# Patient Record
Sex: Female | Born: 1950 | Race: White | Hispanic: No | Marital: Married | Smoking: Former smoker
Health system: Southern US, Community
[De-identification: ages and names within clinical notes are randomized; demographics above are authoritative.]

## PROBLEM LIST (undated history)

## (undated) DIAGNOSIS — Z800 Family history of malignant neoplasm of digestive organs: Secondary | ICD-10-CM

## (undated) DIAGNOSIS — Z973 Presence of spectacles and contact lenses: Secondary | ICD-10-CM

## (undated) DIAGNOSIS — Z803 Family history of malignant neoplasm of breast: Secondary | ICD-10-CM

## (undated) DIAGNOSIS — I371 Nonrheumatic pulmonary valve insufficiency: Secondary | ICD-10-CM

## (undated) DIAGNOSIS — M8589 Other specified disorders of bone density and structure, multiple sites: Secondary | ICD-10-CM

## (undated) DIAGNOSIS — C50919 Malignant neoplasm of unspecified site of unspecified female breast: Secondary | ICD-10-CM

## (undated) DIAGNOSIS — I071 Rheumatic tricuspid insufficiency: Secondary | ICD-10-CM

## (undated) DIAGNOSIS — I340 Nonrheumatic mitral (valve) insufficiency: Secondary | ICD-10-CM

## (undated) DIAGNOSIS — E7800 Pure hypercholesterolemia, unspecified: Secondary | ICD-10-CM

## (undated) DIAGNOSIS — T466X5A Adverse effect of antihyperlipidemic and antiarteriosclerotic drugs, initial encounter: Secondary | ICD-10-CM

## (undated) DIAGNOSIS — J189 Pneumonia, unspecified organism: Secondary | ICD-10-CM

## (undated) DIAGNOSIS — C801 Malignant (primary) neoplasm, unspecified: Secondary | ICD-10-CM

## (undated) DIAGNOSIS — M609 Myositis, unspecified: Secondary | ICD-10-CM

## (undated) DIAGNOSIS — K759 Inflammatory liver disease, unspecified: Secondary | ICD-10-CM

## (undated) DIAGNOSIS — K219 Gastro-esophageal reflux disease without esophagitis: Secondary | ICD-10-CM

## (undated) DIAGNOSIS — C73 Malignant neoplasm of thyroid gland: Secondary | ICD-10-CM

## (undated) DIAGNOSIS — R0600 Dyspnea, unspecified: Secondary | ICD-10-CM

## (undated) DIAGNOSIS — I10 Essential (primary) hypertension: Secondary | ICD-10-CM

## (undated) DIAGNOSIS — E039 Hypothyroidism, unspecified: Secondary | ICD-10-CM

## (undated) HISTORY — DX: Other specified disorders of bone density and structure, multiple sites: M85.89

## (undated) HISTORY — PX: MASTECTOMY: SHX3

## (undated) HISTORY — PX: NO PAST SURGERIES: SHX2092

## (undated) HISTORY — DX: Family history of malignant neoplasm of breast: Z80.3

## (undated) HISTORY — PX: COLONOSCOPY W/ POLYPECTOMY: SHX1380

## (undated) HISTORY — DX: Pure hypercholesterolemia, unspecified: E78.00

## (undated) HISTORY — PX: REDUCTION MAMMAPLASTY: SUR839

## (undated) HISTORY — DX: Adverse effect of antihyperlipidemic and antiarteriosclerotic drugs, initial encounter: T46.6X5A

## (undated) HISTORY — DX: Malignant neoplasm of thyroid gland: C73

## (undated) HISTORY — DX: Myositis, unspecified: M60.9

## (undated) HISTORY — DX: Family history of malignant neoplasm of digestive organs: Z80.0

## (undated) HISTORY — DX: Malignant neoplasm of unspecified site of unspecified female breast: C50.919

## (undated) HISTORY — PX: WISDOM TOOTH EXTRACTION: SHX21

---

## 2015-02-28 ENCOUNTER — Other Ambulatory Visit: Payer: Self-pay | Admitting: Otolaryngology

## 2015-02-28 DIAGNOSIS — J3800 Paralysis of vocal cords and larynx, unspecified: Secondary | ICD-10-CM

## 2015-02-28 DIAGNOSIS — Z8669 Personal history of other diseases of the nervous system and sense organs: Secondary | ICD-10-CM

## 2015-03-06 ENCOUNTER — Ambulatory Visit
Admission: RE | Admit: 2015-03-06 | Discharge: 2015-03-06 | Disposition: A | Discharge status: Home / Self Care | Payer: 59 | Source: Ambulatory Visit | Attending: Otolaryngology | Admitting: Otolaryngology

## 2015-03-06 DIAGNOSIS — E041 Nontoxic single thyroid nodule: Secondary | ICD-10-CM | POA: Insufficient documentation

## 2015-03-06 DIAGNOSIS — J3800 Paralysis of vocal cords and larynx, unspecified: Secondary | ICD-10-CM | POA: Insufficient documentation

## 2015-03-06 DIAGNOSIS — Z8669 Personal history of other diseases of the nervous system and sense organs: Secondary | ICD-10-CM | POA: Diagnosis present

## 2015-03-06 HISTORY — DX: Essential (primary) hypertension: I10

## 2015-03-06 MED ORDER — IOHEXOL 300 MG/ML  SOLN
75.0000 mL | Freq: Once | INTRAMUSCULAR | Status: AC | PRN
  Administered 2015-03-06: 75 mL via INTRAVENOUS

## 2015-03-07 ENCOUNTER — Other Ambulatory Visit: Payer: Self-pay | Admitting: Otolaryngology

## 2015-03-07 DIAGNOSIS — E041 Nontoxic single thyroid nodule: Secondary | ICD-10-CM

## 2015-03-14 ENCOUNTER — Ambulatory Visit
Admission: RE | Admit: 2015-03-14 | Discharge: 2015-03-14 | Disposition: A | Discharge status: Home / Self Care | Payer: 59 | Source: Ambulatory Visit | Attending: Otolaryngology | Admitting: Otolaryngology

## 2015-03-14 ENCOUNTER — Other Ambulatory Visit: Payer: Self-pay | Admitting: Otolaryngology

## 2015-03-14 DIAGNOSIS — E041 Nontoxic single thyroid nodule: Secondary | ICD-10-CM | POA: Diagnosis present

## 2015-03-14 DIAGNOSIS — C73 Malignant neoplasm of thyroid gland: Secondary | ICD-10-CM | POA: Diagnosis not present

## 2015-03-14 NOTE — Procedures (Signed)
US guided FNA of right thyroid nodules.  Nodule in superior lateral aspect and smaller nodule in medial right lobe. No immediate complication.

## 2015-03-18 LAB — CYTOLOGY - NON PAP

## 2015-03-21 ENCOUNTER — Other Ambulatory Visit: Payer: 59

## 2015-03-21 ENCOUNTER — Encounter: Payer: Self-pay | Admitting: *Deleted

## 2015-03-21 NOTE — Patient Instructions (Signed)
  Your procedure is scheduled on: 03-26-15 [facility]) Report to [place] To find out your arrival time please call [phone] between 1PM - 3PM on 03-25-15 (TUESDAY)  Remember: Instructions that are not followed completely may result in serious medical risk, up to and including death, or upon the discretion of your surgeon and anesthesiologist your surgery may need to be rescheduled.    _X___ 1. Do not eat food or drink liquids after midnight. No gum chewing or hard candies.     _X___ 2. No Alcohol for 24 hours before or after surgery.   ____ 3. Bring all medications with you on the day of surgery if instructed.    __X__ 4. Notify your doctor if there is any change in your medical condition     (cold, fever, infections).     Do not wear jewelry, make-up, hairpins, clips or nail polish.  Do not wear lotions, powders, or perfumes. You may wear deodorant.  Do not shave 48 hours prior to surgery. Men may shave face and neck.  Do not bring valuables to the hospital.    [facility] is not responsible for any belongings or valuables.               Contacts, dentures or bridgework may not be worn into surgery.  Leave your suitcase in the car. After surgery it may be brought to your room.  For patients admitted to the hospital, discharge time is determined by your treatment team.   Patients discharged the day of surgery will not be allowed to drive home.   Please read over the following fact sheets that you were given:     _X___ Take these medicines the morning of surgery with A SIP OF WATER:    1. PROZAC  2. LISINOPRIL  3. LEVOTHYROXINE  4. ZANTAC  5. TAKE A ZANTAC ON Tuesday NIGHT  6.  ____ Fleet Enema (as directed)   _X___ Use CHG Soap as directed  ____ Use inhalers on the day of surgery  ____ Stop metformin 2 days prior to surgery    ____ Take 1/2 of usual insulin dose the night before surgery and none on the morning of surgery.   ____  Stop Coumadin/Plavix/aspirin-N/A  ____ Stop Anti-inflammatories-NO NSAIDS OR ASA PRODUCTS-TYLENOL OK TO TAKE   ____ Stop supplements until after surgery.    ____ Bring C-Pap to the hospital.

## 2015-03-24 ENCOUNTER — Inpatient Hospital Stay: Admission: RE | Admit: 2015-03-24 | Payer: 59 | Source: Ambulatory Visit

## 2015-03-25 ENCOUNTER — Inpatient Hospital Stay: Admission: RE | Admit: 2015-03-25 | Payer: 59 | Source: Ambulatory Visit

## 2015-03-25 ENCOUNTER — Encounter: Payer: Self-pay | Admitting: *Deleted

## 2015-03-26 ENCOUNTER — Observation Stay
Admission: RE | Admit: 2015-03-26 | Discharge: 2015-03-27 | Disposition: A | Discharge status: Home / Self Care | Payer: 59 | Source: Ambulatory Visit | Attending: Otolaryngology | Admitting: Otolaryngology

## 2015-03-26 ENCOUNTER — Ambulatory Visit: Payer: 59 | Admitting: Anesthesiology

## 2015-03-26 ENCOUNTER — Encounter: Payer: Self-pay | Admitting: *Deleted

## 2015-03-26 ENCOUNTER — Encounter
Admission: RE | Disposition: A | Discharge status: Home / Self Care | Payer: Self-pay | Source: Ambulatory Visit | Attending: Otolaryngology

## 2015-03-26 DIAGNOSIS — K219 Gastro-esophageal reflux disease without esophagitis: Secondary | ICD-10-CM | POA: Insufficient documentation

## 2015-03-26 DIAGNOSIS — Z8249 Family history of ischemic heart disease and other diseases of the circulatory system: Secondary | ICD-10-CM | POA: Diagnosis not present

## 2015-03-26 DIAGNOSIS — E785 Hyperlipidemia, unspecified: Secondary | ICD-10-CM | POA: Diagnosis not present

## 2015-03-26 DIAGNOSIS — Z7951 Long term (current) use of inhaled steroids: Secondary | ICD-10-CM | POA: Insufficient documentation

## 2015-03-26 DIAGNOSIS — Z79899 Other long term (current) drug therapy: Secondary | ICD-10-CM | POA: Diagnosis not present

## 2015-03-26 DIAGNOSIS — Z8349 Family history of other endocrine, nutritional and metabolic diseases: Secondary | ICD-10-CM | POA: Diagnosis not present

## 2015-03-26 DIAGNOSIS — Z83511 Family history of glaucoma: Secondary | ICD-10-CM | POA: Insufficient documentation

## 2015-03-26 DIAGNOSIS — K759 Inflammatory liver disease, unspecified: Secondary | ICD-10-CM | POA: Insufficient documentation

## 2015-03-26 DIAGNOSIS — C73 Malignant neoplasm of thyroid gland: Secondary | ICD-10-CM | POA: Diagnosis not present

## 2015-03-26 DIAGNOSIS — Z8262 Family history of osteoporosis: Secondary | ICD-10-CM | POA: Insufficient documentation

## 2015-03-26 DIAGNOSIS — I10 Essential (primary) hypertension: Secondary | ICD-10-CM | POA: Insufficient documentation

## 2015-03-26 DIAGNOSIS — Z87891 Personal history of nicotine dependence: Secondary | ICD-10-CM | POA: Insufficient documentation

## 2015-03-26 DIAGNOSIS — M1991 Primary osteoarthritis, unspecified site: Secondary | ICD-10-CM | POA: Diagnosis not present

## 2015-03-26 HISTORY — DX: Gastro-esophageal reflux disease without esophagitis: K21.9

## 2015-03-26 HISTORY — DX: Inflammatory liver disease, unspecified: K75.9

## 2015-03-26 HISTORY — DX: Malignant (primary) neoplasm, unspecified: C80.1

## 2015-03-26 HISTORY — PX: THYROIDECTOMY: SHX17

## 2015-03-26 LAB — CALCIUM: Calcium: 9.1 mg/dL (ref 8.9–10.3)

## 2015-03-26 SURGERY — THYROIDECTOMY
Anesthesia: General | Laterality: Bilateral

## 2015-03-26 MED ORDER — ZOLPIDEM TARTRATE 5 MG PO TABS
10.0000 mg | ORAL_TABLET | Freq: Once | ORAL | Status: AC
  Administered 2015-03-27: 10 mg via ORAL
  Filled 2015-03-26: qty 2

## 2015-03-26 MED ORDER — BACITRACIN ZINC 500 UNIT/GM EX OINT
1.0000 "application " | TOPICAL_OINTMENT | Freq: Three times a day (TID) | CUTANEOUS | Status: DC
  Administered 2015-03-26 – 2015-03-27 (×3): 1 via TOPICAL
  Filled 2015-03-26 (×2): qty 0.9

## 2015-03-26 MED ORDER — REMIFENTANIL HCL 1 MG IV SOLR
INTRAVENOUS | Status: DC | PRN
  Administered 2015-03-26: .025 ug/kg/min via INTRAVENOUS
  Administered 2015-03-26: .05 ug/kg/min via INTRAVENOUS

## 2015-03-26 MED ORDER — PHENYLEPHRINE HCL 10 MG/ML IJ SOLN
INTRAMUSCULAR | Status: DC | PRN
  Administered 2015-03-26 (×7): 100 ug via INTRAVENOUS

## 2015-03-26 MED ORDER — DEXAMETHASONE SODIUM PHOSPHATE 10 MG/ML IJ SOLN
INTRAMUSCULAR | Status: DC | PRN
  Administered 2015-03-26: 8 mg via INTRAVENOUS

## 2015-03-26 MED ORDER — EPHEDRINE SULFATE 50 MG/ML IJ SOLN
INTRAMUSCULAR | Status: DC | PRN
  Administered 2015-03-26 (×2): 10 mg via INTRAVENOUS

## 2015-03-26 MED ORDER — FENTANYL CITRATE (PF) 100 MCG/2ML IJ SOLN
25.0000 ug | INTRAMUSCULAR | Status: DC | PRN
  Administered 2015-03-26 (×3): 50 ug via INTRAVENOUS

## 2015-03-26 MED ORDER — GLYCOPYRROLATE 0.2 MG/ML IJ SOLN
INTRAMUSCULAR | Status: DC | PRN
  Administered 2015-03-26: 0.2 mg via INTRAVENOUS

## 2015-03-26 MED ORDER — ONDANSETRON HCL 4 MG/2ML IJ SOLN
4.0000 mg | INTRAMUSCULAR | Status: DC | PRN
  Administered 2015-03-26: 4 mg via INTRAVENOUS
  Filled 2015-03-26: qty 2

## 2015-03-26 MED ORDER — POTASSIUM CL IN DEXTROSE 5% 20 MEQ/L IV SOLN
20.0000 meq | INTRAVENOUS | Status: DC
  Administered 2015-03-26 (×2): 20 meq via INTRAVENOUS
  Filled 2015-03-26 (×3): qty 1000

## 2015-03-26 MED ORDER — PROPOFOL 10 MG/ML IV BOLUS
INTRAVENOUS | Status: DC | PRN
  Administered 2015-03-26: 150 mg via INTRAVENOUS
  Administered 2015-03-26: 50 mg via INTRAVENOUS

## 2015-03-26 MED ORDER — OXYCODONE HCL 5 MG/5ML PO SOLN: 5.0000 mg | Freq: Once | ORAL | Status: DC | PRN

## 2015-03-26 MED ORDER — MIDAZOLAM HCL 2 MG/2ML IJ SOLN
INTRAMUSCULAR | Status: DC | PRN
  Administered 2015-03-26: 2 mg via INTRAVENOUS

## 2015-03-26 MED ORDER — OXYCODONE HCL 5 MG PO TABS: 5.0000 mg | ORAL_TABLET | Freq: Once | ORAL | Status: DC | PRN

## 2015-03-26 MED ORDER — HYDROCODONE-ACETAMINOPHEN 5-325 MG PO TABS
1.0000 | ORAL_TABLET | ORAL | Status: DC | PRN
  Administered 2015-03-26: 2 via ORAL
  Administered 2015-03-26: 1 via ORAL
  Filled 2015-03-26: qty 1
  Filled 2015-03-26: qty 2

## 2015-03-26 MED ORDER — ONDANSETRON HCL 4 MG/2ML IJ SOLN
INTRAMUSCULAR | Status: DC | PRN
  Administered 2015-03-26: 4 mg via INTRAVENOUS

## 2015-03-26 MED ORDER — FENTANYL CITRATE (PF) 100 MCG/2ML IJ SOLN
INTRAMUSCULAR | Status: AC
  Filled 2015-03-26: qty 2

## 2015-03-26 MED ORDER — FAMOTIDINE 20 MG PO TABS
20.0000 mg | ORAL_TABLET | Freq: Every day | ORAL | Status: DC
  Administered 2015-03-26: 20 mg via ORAL
  Filled 2015-03-26: qty 1

## 2015-03-26 MED ORDER — FLUOXETINE HCL 20 MG PO CAPS: 40.0000 mg | ORAL_CAPSULE | Freq: Every day | ORAL | Status: DC

## 2015-03-26 MED ORDER — LIDOCAINE-EPINEPHRINE (PF) 1 %-1:200000 IJ SOLN
INTRAMUSCULAR | Status: AC
  Filled 2015-03-26: qty 30

## 2015-03-26 MED ORDER — ONDANSETRON HCL 4 MG PO TABS: 4.0000 mg | ORAL_TABLET | ORAL | Status: DC | PRN

## 2015-03-26 MED ORDER — HYDROMORPHONE HCL 1 MG/ML IJ SOLN
INTRAMUSCULAR | Status: AC
  Administered 2015-03-26: 0.5 mg via INTRAVENOUS
  Filled 2015-03-26: qty 1

## 2015-03-26 MED ORDER — FENTANYL CITRATE (PF) 100 MCG/2ML IJ SOLN
INTRAMUSCULAR | Status: AC
  Administered 2015-03-26: 50 ug via INTRAVENOUS
  Filled 2015-03-26: qty 2

## 2015-03-26 MED ORDER — BACITRACIN 500 UNIT/GM EX OINT
TOPICAL_OINTMENT | CUTANEOUS | Status: DC | PRN
  Administered 2015-03-26: 1 via TOPICAL

## 2015-03-26 MED ORDER — LACTATED RINGERS IV SOLN
INTRAVENOUS | Status: DC
  Administered 2015-03-26 (×2): via INTRAVENOUS

## 2015-03-26 MED ORDER — HYDROMORPHONE HCL 1 MG/ML IJ SOLN
0.5000 mg | INTRAMUSCULAR | Status: DC | PRN
  Administered 2015-03-26 (×2): 0.5 mg via INTRAVENOUS

## 2015-03-26 MED ORDER — SUCCINYLCHOLINE CHLORIDE 20 MG/ML IJ SOLN
INTRAMUSCULAR | Status: DC | PRN
  Administered 2015-03-26: 100 mg via INTRAVENOUS

## 2015-03-26 MED ORDER — LIDOCAINE-EPINEPHRINE (PF) 1 %-1:200000 IJ SOLN
INTRAMUSCULAR | Status: DC | PRN
  Administered 2015-03-26: 3 mL

## 2015-03-26 MED ORDER — CALCIUM CARBONATE-VITAMIN D 500-200 MG-UNIT PO TABS
2.0000 | ORAL_TABLET | Freq: Two times a day (BID) | ORAL | Status: DC
  Administered 2015-03-26: 2 via ORAL
  Filled 2015-03-26: qty 2

## 2015-03-26 MED ORDER — FENTANYL CITRATE (PF) 100 MCG/2ML IJ SOLN
INTRAMUSCULAR | Status: DC | PRN
  Administered 2015-03-26 (×2): 50 ug via INTRAVENOUS

## 2015-03-26 MED ORDER — DOCUSATE SODIUM 100 MG PO CAPS
100.0000 mg | ORAL_CAPSULE | Freq: Two times a day (BID) | ORAL | Status: DC
  Administered 2015-03-26: 100 mg via ORAL
  Filled 2015-03-26: qty 1

## 2015-03-26 MED ORDER — BACITRACIN ZINC 500 UNIT/GM EX OINT
TOPICAL_OINTMENT | CUTANEOUS | Status: AC
  Filled 2015-03-26: qty 28.35

## 2015-03-26 MED ORDER — LISINOPRIL 10 MG PO TABS: 10.0000 mg | ORAL_TABLET | Freq: Every morning | ORAL | Status: DC

## 2015-03-26 MED ORDER — LIDOCAINE HCL (CARDIAC) 20 MG/ML IV SOLN
INTRAVENOUS | Status: DC | PRN
  Administered 2015-03-26: 80 mg via INTRAVENOUS

## 2015-03-26 MED ORDER — MORPHINE SULFATE (PF) 2 MG/ML IV SOLN
2.0000 mg | INTRAVENOUS | Status: DC | PRN
  Administered 2015-03-26 (×2): 2 mg via INTRAVENOUS
  Filled 2015-03-26 (×2): qty 1

## 2015-03-26 SURGICAL SUPPLY — 33 items
BLADE SURG 15 STRL LF DISP TIS (BLADE) ×1 IMPLANT
BLADE SURG 15 STRL SS (BLADE) ×2
CANISTER SUCT 1200ML W/VALVE (MISCELLANEOUS) ×3 IMPLANT
CORD BIP STRL DISP 12FT (MISCELLANEOUS) ×3 IMPLANT
DRAIN TLS ROUND 10FR (DRAIN) ×6 IMPLANT
DRAPE MAG INST 16X20 L/F (DRAPES) ×3 IMPLANT
DRSG TEGADERM 2-3/8X2-3/4 SM (GAUZE/BANDAGES/DRESSINGS) ×3 IMPLANT
ELECT LARYNGEAL 6/7 (MISCELLANEOUS) ×3
ELECT LARYNGEAL 8/9 (MISCELLANEOUS) ×3
ELECTRODE LARYNGEAL 6/7 (MISCELLANEOUS) ×1 IMPLANT
ELECTRODE LARYNGEAL 8/9 (MISCELLANEOUS) ×1 IMPLANT
FORCEPS JEWEL BIP 4-3/4 STR (INSTRUMENTS) ×3 IMPLANT
GLOVE BIO SURGEON STRL SZ7.5 (GLOVE) ×6 IMPLANT
GOWN STRL REUS W/ TWL LRG LVL3 (GOWN DISPOSABLE) ×2 IMPLANT
GOWN STRL REUS W/TWL LRG LVL3 (GOWN DISPOSABLE) ×4
HARMONIC SCALPEL FOCUS (MISCELLANEOUS) ×3 IMPLANT
HEMOSTAT SURGICEL 2X3 (HEMOSTASIS) ×3 IMPLANT
HOOK STAY BLUNT/RETRACTOR 5M (MISCELLANEOUS) ×3 IMPLANT
KIT RM TURNOVER STRD PROC AR (KITS) ×3 IMPLANT
LABEL OR SOLS (LABEL) IMPLANT
NS IRRIG 500ML POUR BTL (IV SOLUTION) IMPLANT
PACK HEAD/NECK (MISCELLANEOUS) ×3 IMPLANT
PAD GROUND ADULT SPLIT (MISCELLANEOUS) ×3 IMPLANT
PROBE NEUROSIGN BIPOL (MISCELLANEOUS) ×1 IMPLANT
PROBE NEUROSIGN BIPOLAR (MISCELLANEOUS) ×2
SPONGE KITTNER 5P (MISCELLANEOUS) ×6 IMPLANT
SPONGE XRAY 4X4 16PLY STRL (MISCELLANEOUS) ×3 IMPLANT
SUT PROLENE 3 0 PS 2 (SUTURE) ×3 IMPLANT
SUT SILK 2 0 (SUTURE)
SUT SILK 2 0 SH (SUTURE) ×3 IMPLANT
SUT SILK 2-0 18XBRD TIE 12 (SUTURE) IMPLANT
SUT VIC AB 4-0 RB1 18 (SUTURE) ×3 IMPLANT
SYSTEM CHEST DRAIN TLS 7FR (DRAIN) IMPLANT

## 2015-03-26 NOTE — Anesthesia Procedure Notes (Signed)
Procedure Name: Intubation Date/Time: 03/26/2015 7:15 AM Performed by: Andria Frames Pre-anesthesia Checklist: Patient identified, Emergency Drugs available, Suction available, Patient being monitored and Timeout performed Patient Re-evaluated:Patient Re-evaluated prior to inductionOxygen Delivery Method: Circle system utilized Preoxygenation: Pre-oxygenation with 100% oxygen Intubation Type: IV induction Ventilation: Mask ventilation without difficulty Laryngoscope Size: McGraph and 3 Grade View: Grade II Tube type: Oral Tube size: 7.0 mm Number of attempts: 1 Airway Equipment and Method: Stylet Placement Confirmation: ETT inserted through vocal cords under direct vision,  positive ETCO2 and breath sounds checked- equal and bilateral Secured at: 21 cm Tube secured with: Tape Dental Injury: Teeth and Oropharynx as per pre-operative assessment

## 2015-03-26 NOTE — H&P (Signed)
History and physical reviewed and will be scanned in later. No change in medical status reported by the patient or family, appears stable for surgery. All questions regarding the procedure answered, and patient (or family if a child) expressed understanding of the procedure.  Gabrielle Davidson S @TODAY@ 

## 2015-03-26 NOTE — Progress Notes (Signed)
Pt was complaining of pain but stated the PO and IV pain medications were making her feel sick. Asked if she wanted pain medication with zofran - pt stated that she tried that earlier and it didn't really help much. Pt requested we call the [facility] MD and ask for something for sleep - she wanted to sleep off the pain. Pt stated that Lorrin Mais has worked for her in the past. Pt's IV was also infiltrated. MD notified of ambien request and IV - ordered 10mg  ambien and gave ok to leave out IV.

## 2015-03-26 NOTE — Progress Notes (Signed)
TLS drains x 2 intact.

## 2015-03-26 NOTE — Anesthesia Preprocedure Evaluation (Addendum)
Anesthesia Evaluation  Patient identified by MRN, date of birth, ID band Patient awake    Reviewed: Allergy & Precautions, H&P , NPO status , Patient's Chart, lab work & pertinent test results  History of Anesthesia Complications Negative for: history of anesthetic complications  Airway Mallampati: II  TM Distance: >3 FB Neck ROM: full    Dental no notable dental hx. (+) Teeth Intact   Pulmonary neg shortness of breath, former smoker,    Pulmonary exam normal breath sounds clear to auscultation       Cardiovascular Exercise Tolerance: Good hypertension, (-) angina(-) Past MI, (-) Orthopnea and (-) DOE Normal cardiovascular exam Rhythm:regular Rate:Normal     Neuro/Psych negative neurological ROS  negative psych ROS   GI/Hepatic GERD  Controlled,(+) Hepatitis -  Endo/Other  negative endocrine ROS  Renal/GU negative Renal ROS  negative genitourinary   Musculoskeletal   Abdominal   Peds  Hematology negative hematology ROS (+)   Anesthesia Other Findings Past Medical History:   Hypertension                                                 GERD (gastroesophageal reflux disease)                       Cancer (HCC)                                                 Hepatitis                                                      Comment:A-YEARS AGO  Past Surgical History:   NO PAST SURGERIES                                            BMI    Body Mass Index   29.27 kg/m 2    Patient has hoarse voice which she believes is from her thyroid   Reproductive/Obstetrics negative OB ROS                            Anesthesia Physical Anesthesia Plan  ASA: III  Anesthesia Plan: General ETT   Post-op Pain Management:    Induction:   Airway Management Planned:   Additional Equipment:   Intra-op Plan:   Post-operative Plan:   Informed Consent: I have reviewed the patients History and  Physical, chart, labs and discussed the procedure including the risks, benefits and alternatives for the proposed anesthesia with the patient or authorized representative who has indicated his/her understanding and acceptance.   Dental Advisory Given  Plan Discussed with: Anesthesiologist, CRNA and Surgeon  Anesthesia Plan Comments:         Anesthesia Quick Evaluation

## 2015-03-26 NOTE — Progress Notes (Signed)
Ice pack to neck. 

## 2015-03-26 NOTE — Op Note (Signed)
03/26/2015  9:29 AM    Gabrielle Davidson  [id number]   Pre-Op Diagnosis:  Thyroid cancer  Post-op Diagnosis: Thyroid cancer  Procedure:   Total thyroidectomy  Surgeon:  [name]   First Assistant: Beverly Gust  Anesthesia:  General endotracheal  EBL:  Less than 25 cc  Complications:  None  Findings: Nodular thyroid gland with adhesions of the left lobe of the gland to the underlying cricoid cartilage and trachea, and cricothyroid muscle. 1 cm nodule in the right superior thyroid lobe with some adhesions medially near the cricoid cartilage. Both recurrent laryngeal nerves were identified and preserved. The gland was not significantly adherent to the underlying recurrent laryngeal nerves.   Procedure: After the patient was identified in holding and the procedure was reviewed.  The patient was taken to the operating room and with the patient in a comfortable supine position,  general endotracheal anesthesia was induced without difficulty.  A nerve monitor on the endotracheal tube was visualized to be between the cords at the time of intubation. A proper time-out was performed, confirming the operative site and procedure.  The patient was placed on a shoulder roll and position. Skin was injected with 1% lidocaine with epinephrine 1-200,000. The patient was then prepped and draped in the usual sterile fashion. A 15 blade was used to incise the skin carrying the incision down through the subcutaneous tissues. The platysma muscle was divided with the Bovie. The strap muscles were identified in the midline and divided in the midline, retracting them laterally. Small anterior jugular veins were either clamped and suture divided or divided with the Harmonic scalpel. The strap muscles were retracted laterally off of the capsule of the left lobe of the thyroid gland. This was then finger dissected to loosen loose attachments to the gland. There was significant adhesion of the strap  muscle to the capsule of the gland, and it was carefully dissected off using the Harmonic scalpel. Dissection proceeded superiorly, dissecting the superior pole of the gland. The superior pole vessels were divided with the Harmonic scalpel. A small parathyroid gland was visualized and preserved during this dissection. The superior pole was retracted inferiorly and dissection proceeded around the lateral aspect of the gland, dividing vascular attachments with the Harmonic scalpel. The inferior pole was dissected, identifying a small structure consistent with the parathyroid, and the inferior pole vessels were divided with the Harmonic scalpel. Care was taken to divide all of these vessels right at the capsule of the gland. The gland was then retracted medially and delivered from the wound. Dissection along the trachea revealed the recurrent laryngeal nerve near its entrance to the larynx, which stimulated properly. The gland was then dissected off of the trachea using the bipolar cautery, dividing Berry's ligament with the nerve carefully protected. The gland was particularly adherent in this region to the cricoid cartilage and adjacent trachea, possibly with some focal invasion. The left lobe of the gland was then reflected medially and attention turned to the opposite side. Next the right lobe of the thyroid gland was dissected in the same fashion as described above. Once again the superior and inferior pole vessels were divided right at the capsule of the gland and structures consistent with parathyroid tissue were identified both superiorly and inferiorly. Retracting the gland medially the recurrent laryngeal nerve was identified and confirmed with the stimulator. This was then carefully protected as the gland was dissected away from the trachea and Berry's ligament divided. Once again adhesions were noted  medially to the cricoid cartilage, requiring careful dissection away from the cricoid cartilage with the  bipolar cautery. The right lobe was delivered and the entire gland sent as a specimen with the long stitch on the right lobe, and a short stitch on the left lobe. A portion of the left lobe had torn free and was sent as a free specimen.  The wound was then irrigated and inspected for bleeding. #10 TLS drains were placed on either side of the trachea with the drains coming out through the skin just below the wound. The drains were secured with 4-0 Vicryl suture. Surgicel was placed on either side of the trachea to control minor oozing within the wound. The strap muscles were then reapproximated with 4-0 Vicryl suture. The platysma and subcutaneous tissues were also closed with 4-0 Vicryl suture. The skin was closed with a 3-0 running subcuticular Prolene suture. The patient was then returned to the anesthesiologist for awakening. The patient was awakened and taken to recovery room in good condition postoperatively.  The patient was then returned to the anesthesiologist in good condition for awakening. The patient was awakened and taken to the recovery room in good condition.   Disposition:   PACU then transferred to floor  Plan: The patient is to be admitted for observation and monitoring of serum calcium, with potential discharge tomorrow if serum calcium is stable overnight.  [name] 03/26/2015 9:29 AM

## 2015-03-26 NOTE — Progress Notes (Signed)
Patient ID: Gabrielle Davidson, female   DOB: 08/24/1950, 65 y.o.   MRN: [id number] Gabrielle Davidson, Gabrielle Davidson [id number] 08/27/1950 Gabrielle Nearing, MD   SUBJECTIVE: This 65 y.o. year old female is status post THYROIDECTOMY. Doing well post-op, eating solids. Pain well controlled. Hoarse, but no worse than preop (she has known left cord paresis).  Medications:  Current Facility-Administered Medications  Medication Dose Route Frequency Provider Last Rate Last Dose  . bacitracin ointment 1 application  1 application Topical 3 times per day Gabrielle Canterbury, MD   1 application at [id number]  . calcium-vitamin D (OSCAL WITH D) 500-200 MG-UNIT per tablet 2 tablet  2 tablet Oral BID Gabrielle Canterbury, MD   Stopped at 03/26/15 1100  . dextrose 5 % with KCl 20 mEq / L  infusion  20 mEq Intravenous Continuous Gabrielle Canterbury, MD 100 mL/hr at 03/26/15 1346 20 mEq at 03/26/15 1346  . docusate sodium (COLACE) capsule 100 mg  100 mg Oral BID Gabrielle Canterbury, MD   Stopped at 03/26/15 1100  . famotidine (PEPCID) tablet 20 mg  20 mg Oral QHS Gabrielle Canterbury, MD      . fentaNYL (SUBLIMAZE) 100 MCG/2ML injection           . [START ON 03/27/2015] FLUoxetine (PROZAC) capsule 40 mg  40 mg Oral Daily Gabrielle Canterbury, MD      . HYDROcodone-acetaminophen (NORCO/VICODIN) 5-325 MG per tablet 1-2 tablet  1-2 tablet Oral Q4H PRN Gabrielle Canterbury, MD   1 tablet at 03/26/15 1345  . lisinopril (PRINIVIL,ZESTRIL) tablet 10 mg  10 mg Oral q morning - 10a Gabrielle Canterbury, MD      . morphine 2 MG/ML injection 2-4 mg  2-4 mg Intravenous Q2H PRN Gabrielle Canterbury, MD   2 mg at 03/26/15 1600  . ondansetron (ZOFRAN) tablet 4 mg  4 mg Oral Q4H PRN Gabrielle Canterbury, MD       Or  . ondansetron [facility]) injection 4 mg  4 mg Intravenous Q4H PRN Gabrielle Canterbury, MD      .  Medications Prior to Admission  Medication Sig Dispense Refill  . FLUoxetine (PROZAC) 40 MG capsule Take 40 mg by mouth every morning.     Marland Kitchen levothyroxine (SYNTHROID, LEVOTHROID) 75 MCG tablet Take 75 mcg by  mouth daily before breakfast.    . lisinopril (PRINIVIL,ZESTRIL) 10 MG tablet Take 10 mg by mouth every morning.     . norethindrone-ethinyl estradiol (NECON,BREVICON,MODICON) 0.5-35 MG-MCG tablet Take 1 tablet by mouth daily.    . ranitidine (ZANTAC) 150 MG tablet Take 150 mg by mouth as needed for heartburn.      OBJECTIVE:  PHYSICAL EXAM  Vitals: Blood pressure 123/65, pulse 73, temperature 97.5 F (36.4 C), temperature source Tympanic, resp. rate 16, height 4\' 11"  (1.499 m), weight 65.772 kg (145 lb), SpO2 97 %.. General: Well-developed, Well-nourished in no acute distress Mood: Mood and affect well adjusted, pleasant and cooperative. Orientation: Grossly alert and oriented. Vocal Quality: No hoarseness. Communicates verbally. head and Face: NCAT. No facial asymmetry. No visible skin lesions. No significant facial scars. No tenderness with sinus percussion. Facial strength normal and symmetric. Neck: Supple and symmetric with no palpable masses, tenderness or crepitance. The trachea is midline. Neck wound clean/dry, intact. No hematoma. Lymphatic: Cervical lymph nodes are without palpable lymphadenopathy or tenderness. Respiratory: Normal respiratory effort without labored breathing.  MEDICAL DECISION MAKING: Data Review: No results found for this or any previous visit (from the past 48 hour(s)).Marland Kitchen No results found.Marland Kitchen  ASSESSMENT: Status post total thyroidectomy, doing well. Will monitor serum Calcium. She is already advancing her diet.   PLAN: Potential d/c in AM depending on Calcium. Continue drains.   Gabrielle Nearing, MD 03/26/2015 5:04 PM

## 2015-03-26 NOTE — Anesthesia Postprocedure Evaluation (Signed)
Anesthesia Post Note  Patient: [name]  Procedure(s) Performed: Procedure(s) (LRB): THYROIDECTOMY (Bilateral)  Patient location during evaluation: PACU Anesthesia Type: General Level of consciousness: awake and alert Pain management: pain level controlled Vital Signs Assessment: post-procedure vital signs reviewed and stable Respiratory status: spontaneous breathing, nonlabored ventilation, respiratory function stable and patient connected to nasal cannula oxygen Cardiovascular status: blood pressure returned to baseline and stable Postop Assessment: no signs of nausea or vomiting Anesthetic complications: no    Last Vitals:  Filed Vitals:   03/26/15 1052 03/26/15 1100  BP: 123/65   Pulse: 73   Temp:  36.4 C  Resp: 16     Last Pain:  Filed Vitals:   03/26/15 1110  PainSc: 2                  Precious Haws Piscitello

## 2015-03-26 NOTE — Transfer of Care (Signed)
Immediate Anesthesia Transfer of Care Note  Patient: Gabrielle Davidson  Procedure(s) Performed: Procedure(s): THYROIDECTOMY (Bilateral)  Patient Location: PACU  Anesthesia Type:General  Level of Consciousness: sedated  Airway & Oxygen Therapy: Patient Spontanous Breathing and Patient connected to face mask oxygen  Post-op Assessment: Report given to RN and Post -op Vital signs reviewed and stable  Post vital signs: Reviewed and stable  Last Vitals:  Filed Vitals:   03/26/15 0603 03/26/15 0931  BP: 102/64 132/70  Pulse: 64   Temp: 37.1 C 36.4 C  Resp: 16 13    Complications: No apparent anesthesia complications

## 2015-03-26 NOTE — Progress Notes (Signed)
TLS drain x 2 to neck intact from OR.

## 2015-03-27 DIAGNOSIS — C73 Malignant neoplasm of thyroid gland: Secondary | ICD-10-CM | POA: Diagnosis not present

## 2015-03-27 LAB — SURGICAL PATHOLOGY

## 2015-03-27 MED ORDER — DOCUSATE SODIUM 100 MG PO CAPS: 100.0000 mg | ORAL_CAPSULE | Freq: Two times a day (BID) | ORAL | Status: DC

## 2015-03-27 MED ORDER — TRAMADOL-ACETAMINOPHEN 37.5-325 MG PO TABS: 1.0000 | ORAL_TABLET | ORAL | Status: DC | PRN

## 2015-03-27 NOTE — Progress Notes (Signed)
Pt alert and oriented. VSS. Concerns addressed. Pt reported IV site removed last night due to infiltration. Discharge summary and prescriptions given to pt. Pt discharged to home.

## 2015-03-27 NOTE — Discharge Instructions (Signed)
Patient may replace dressing over drain openings as needed, and will not need dressing once discharge resolves. Polysporin to wound twice daily for 5 days. Wound may be cleaned as needed with hydrogen peroxide. Patient may shower. Call for fever, excessive swelling at wound site, or uncontrolled pain. Take pain medications as prescribed. An ice pack may be applied to control minor swelling and as needed for comfort. Follow-up in 1 week for suture removal.  She should not take her levothyroxine as we need her hypothyroid for future radioactive iodine treatments.

## 2015-03-27 NOTE — Progress Notes (Signed)
Patient ID: Gabrielle Davidson, female   DOB: [birth year], 65 y.o.   MRN: [id number] Gabrielle Davidson, Gabrielle Davidson [id number] 1950/05/11 Gabrielle Nearing, MD   SUBJECTIVE: This 65 y.o. year old female is status post THYROIDECTOMY. Doing well, though did not sleep well last PM. Not having much pain. Hydrocodone caused nausea.  Medications:  Current Facility-Administered Medications  Medication Dose Route Frequency Provider Last Rate Last Dose  . bacitracin ointment 1 application  1 application Topical 3 times per day Clyde Canterbury, MD   1 application at [id number]  . calcium-vitamin D (OSCAL WITH D) 500-200 MG-UNIT per tablet 2 tablet  2 tablet Oral BID Clyde Canterbury, MD   2 tablet at 03/26/15 2256  . docusate sodium (COLACE) capsule 100 mg  100 mg Oral BID Clyde Canterbury, MD   100 mg at 03/26/15 2256  . famotidine (PEPCID) tablet 20 mg  20 mg Oral QHS Clyde Canterbury, MD   20 mg at 03/26/15 2256  . FLUoxetine (PROZAC) capsule 40 mg  40 mg Oral Daily Clyde Canterbury, MD      . HYDROcodone-acetaminophen (NORCO/VICODIN) 5-325 MG per tablet 1-2 tablet  1-2 tablet Oral Q4H PRN Clyde Canterbury, MD   2 tablet at 03/26/15 1746  . lisinopril (PRINIVIL,ZESTRIL) tablet 10 mg  10 mg Oral q morning - 10a Clyde Canterbury, MD      . morphine 2 MG/ML injection 2-4 mg  2-4 mg Intravenous Q2H PRN Clyde Canterbury, MD   2 mg at 03/26/15 2121  . ondansetron (ZOFRAN) tablet 4 mg  4 mg Oral Q4H PRN Clyde Canterbury, MD       Or  . ondansetron [facility]) injection 4 mg  4 mg Intravenous Q4H PRN Clyde Canterbury, MD   4 mg at 03/26/15 1812  .  Medications Prior to Admission  Medication Sig Dispense Refill  . FLUoxetine (PROZAC) 40 MG capsule Take 40 mg by mouth every morning.     Marland Kitchen levothyroxine (SYNTHROID, LEVOTHROID) 75 MCG tablet Take 75 mcg by mouth daily before breakfast.    . lisinopril (PRINIVIL,ZESTRIL) 10 MG tablet Take 10 mg by mouth every morning.     . norethindrone-ethinyl estradiol (NECON,BREVICON,MODICON) 0.5-35 MG-MCG tablet Take 1 tablet  by mouth daily.    . ranitidine (ZANTAC) 150 MG tablet Take 150 mg by mouth as needed for heartburn.      OBJECTIVE:  PHYSICAL EXAM  Vitals: Blood pressure 112/51, pulse 66, temperature 98 F (36.7 C), temperature source Oral, resp. rate 18, height 4\' 11"  (1.499 m), weight 65.772 kg (145 lb), SpO2 95 %.. General: Well-developed, Well-nourished in no acute distress Mood: Mood and affect well adjusted, pleasant and cooperative. Orientation: Grossly alert and oriented. Vocal Quality: No hoarseness. Communicates verbally. head and Face: NCAT. No facial asymmetry. No visible skin lesions. No significant facial scars. No tenderness with sinus percussion. Facial strength normal and symmetric. Neck: Supple and symmetric with no palpable masses, tenderness or crepitance. The trachea is midline. Neck wound is clean, dry and intact. No evidence of hematoma. Drains were removed. Had minimal serous fluid in them.  Lymphatic: Cervical lymph nodes are without palpable lymphadenopathy or tenderness. Respiratory: Normal respiratory effort without labored breathing.  MEDICAL DECISION MAKING: Data Review:  Results for orders placed or performed during the hospital encounter of 03/26/15 (from the past 48 hour(s))  Calcium     Status: None   Collection Time: 03/26/15  4:41 PM  Result Value Ref Range   Calcium 9.1 8.9 - 10.3 mg/dL  .  No results found..   ASSESSMENT: Status post total thyroidectomy for papillary thyroid cancer, currently doing well. Drains have been removed. Calcium looks stable.  PLAN: Will discharge home this morning. I will give her Ultram to help manage pain. Hopefully that will cause less nausea. I will see her back in a week for suture removal. We'll get one more serum calcium this morning prior to discharge.   Gabrielle Nearing, MD 03/27/2015 8:13 AM

## 2015-04-17 DIAGNOSIS — E890 Postprocedural hypothyroidism: Secondary | ICD-10-CM | POA: Insufficient documentation

## 2015-04-18 ENCOUNTER — Other Ambulatory Visit: Payer: Self-pay | Admitting: Internal Medicine

## 2015-04-18 DIAGNOSIS — C73 Malignant neoplasm of thyroid gland: Secondary | ICD-10-CM

## 2015-04-23 ENCOUNTER — Ambulatory Visit: Payer: 59

## 2015-04-24 ENCOUNTER — Other Ambulatory Visit: Payer: 59

## 2015-04-28 ENCOUNTER — Encounter
Admission: RE | Admit: 2015-04-28 | Discharge: 2015-04-28 | Disposition: A | Discharge status: Home / Self Care | Payer: 59 | Source: Ambulatory Visit | Attending: Internal Medicine | Admitting: Internal Medicine

## 2015-04-28 ENCOUNTER — Ambulatory Visit: Payer: 59

## 2015-04-28 ENCOUNTER — Other Ambulatory Visit: Payer: 59

## 2015-04-28 DIAGNOSIS — C73 Malignant neoplasm of thyroid gland: Secondary | ICD-10-CM | POA: Diagnosis not present

## 2015-04-28 MED ORDER — THYROTROPIN ALFA 1.1 MG IM SOLR
0.9000 mg | INTRAMUSCULAR | Status: AC
  Administered 2015-04-28: 0.9 mg via INTRAMUSCULAR

## 2015-04-29 ENCOUNTER — Encounter
Admission: RE | Admit: 2015-04-29 | Discharge: 2015-04-29 | Disposition: A | Discharge status: Home / Self Care | Payer: 59 | Source: Ambulatory Visit | Attending: Internal Medicine | Admitting: Internal Medicine

## 2015-04-29 ENCOUNTER — Other Ambulatory Visit: Payer: 59

## 2015-04-29 DIAGNOSIS — C73 Malignant neoplasm of thyroid gland: Secondary | ICD-10-CM | POA: Diagnosis not present

## 2015-04-29 MED ORDER — THYROTROPIN ALFA 1.1 MG IM SOLR
0.9000 mg | INTRAMUSCULAR | Status: AC
  Administered 2015-04-29: 0.9 mg via INTRAMUSCULAR
  Filled 2015-04-29: qty 0.9

## 2015-04-30 ENCOUNTER — Encounter
Admission: RE | Admit: 2015-04-30 | Discharge: 2015-04-30 | Disposition: A | Discharge status: Home / Self Care | Payer: 59 | Source: Ambulatory Visit | Attending: Internal Medicine | Admitting: Internal Medicine

## 2015-04-30 MED ORDER — SODIUM IODIDE I 131 CAPSULE
4.1400 | Freq: Once | INTRAVENOUS | Status: AC | PRN
  Administered 2015-04-30: 4.14 via ORAL

## 2015-05-02 ENCOUNTER — Encounter: Admission: RE | Admit: 2015-05-02 | Payer: 59 | Source: Ambulatory Visit

## 2015-05-05 ENCOUNTER — Other Ambulatory Visit: Payer: Self-pay | Admitting: Internal Medicine

## 2015-05-05 DIAGNOSIS — C73 Malignant neoplasm of thyroid gland: Secondary | ICD-10-CM

## 2015-05-06 ENCOUNTER — Encounter
Admission: RE | Admit: 2015-05-06 | Discharge: 2015-05-06 | Disposition: A | Discharge status: Home / Self Care | Payer: 59 | Source: Ambulatory Visit | Attending: Internal Medicine | Admitting: Internal Medicine

## 2015-05-06 DIAGNOSIS — C73 Malignant neoplasm of thyroid gland: Secondary | ICD-10-CM | POA: Insufficient documentation

## 2015-05-06 MED ORDER — THYROTROPIN ALFA 1.1 MG IM SOLR
0.9000 mg | INTRAMUSCULAR | Status: AC
  Administered 2015-05-06: 0.9 mg via INTRAMUSCULAR
  Filled 2015-05-06: qty 0.9

## 2015-05-07 ENCOUNTER — Encounter
Admission: RE | Admit: 2015-05-07 | Discharge: 2015-05-07 | Disposition: A | Discharge status: Home / Self Care | Payer: 59 | Source: Ambulatory Visit | Attending: Internal Medicine | Admitting: Internal Medicine

## 2015-05-07 DIAGNOSIS — C73 Malignant neoplasm of thyroid gland: Secondary | ICD-10-CM | POA: Diagnosis not present

## 2015-05-07 MED ORDER — THYROTROPIN ALFA 1.1 MG IM SOLR
0.9000 mg | INTRAMUSCULAR | Status: AC
  Administered 2015-05-07: 0.9 mg via INTRAMUSCULAR
  Filled 2015-05-07: qty 0.9

## 2015-05-08 ENCOUNTER — Encounter
Admission: RE | Admit: 2015-05-08 | Discharge: 2015-05-08 | Disposition: A | Discharge status: Home / Self Care | Payer: 59 | Source: Ambulatory Visit | Attending: Internal Medicine | Admitting: Internal Medicine

## 2015-05-08 MED ORDER — SODIUM IODIDE I 131 CAPSULE
102.0000 | Freq: Once | INTRAVENOUS | Status: AC | PRN
  Administered 2015-05-08: 102 via ORAL

## 2015-05-15 ENCOUNTER — Encounter
Admission: RE | Admit: 2015-05-15 | Discharge: 2015-05-15 | Disposition: A | Discharge status: Home / Self Care | Payer: PRIVATE HEALTH INSURANCE | Source: Ambulatory Visit | Attending: Internal Medicine | Admitting: Internal Medicine

## 2015-05-15 DIAGNOSIS — C73 Malignant neoplasm of thyroid gland: Secondary | ICD-10-CM | POA: Insufficient documentation

## 2016-01-13 DIAGNOSIS — F4322 Adjustment disorder with anxiety: Secondary | ICD-10-CM | POA: Insufficient documentation

## 2016-01-22 ENCOUNTER — Other Ambulatory Visit: Payer: Self-pay | Admitting: *Deleted

## 2016-01-22 DIAGNOSIS — M79601 Pain in right arm: Secondary | ICD-10-CM

## 2016-01-31 ENCOUNTER — Ambulatory Visit
Admission: RE | Admit: 2016-01-31 | Discharge: 2016-01-31 | Disposition: A | Discharge status: Home / Self Care | Payer: No Typology Code available for payment source | Source: Ambulatory Visit | Attending: *Deleted | Admitting: *Deleted

## 2016-01-31 DIAGNOSIS — M79601 Pain in right arm: Secondary | ICD-10-CM

## 2016-04-16 IMAGING — NM NM [ID] THYROID CANCER METS WHOLE BODY W/ THYROGEN
2 series · 6 of 6 positions shown · non-contrast
Comparison: None

CLINICAL DATA: Papillary thyroid cancer. Status post thyroidectomy.
[id number]

EXAM:
THYROGEN-STIMULATED 5-T5T WHOLE BODY SCAN
TECHNIQUE: The patient received 0.9 mg Thyrogen intramuscularly every 24 hours
for two doses. On the third day the patient returned and received
the radiopharmaceutical, per orally. On the fifth day, the patient
returned and whole body planar images were obtained in the anterior
and posterior projections.
RADIOPHARMACEUTICALS:  4.1 mCi 5-T5T sodium iodide orally

[Series 1000: (id) wb scan · 2.40mm/px · 2 of 2 frames shown]
[frame 1/2]
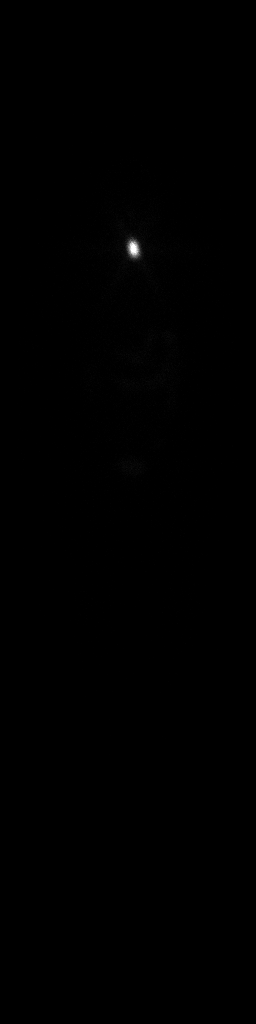
[frame 2/2]
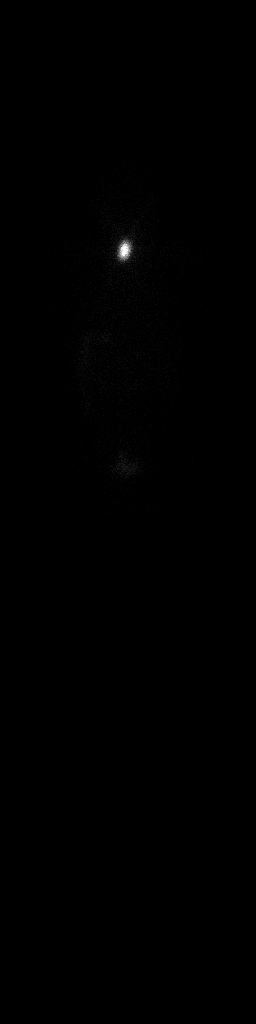

[Series 1000: (id) statics 48 hour · 2.40mm/px · 2 acquisitions, 4 frames shown]
[im 1/2  full-range]
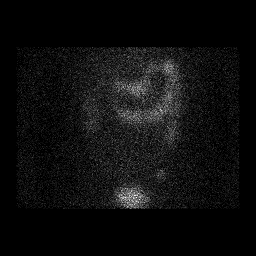
[im 1/2  full-range]
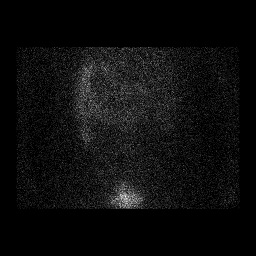
[im 2/2]
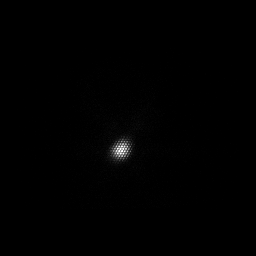
[im 2/2]
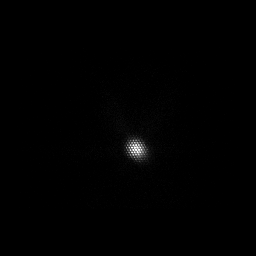

[6 of 6 positions shown; findings below may reference images not displayed]

FINDINGS: There is a large focus of intense radiotracer uptake identified
within the thyroid bed compatible with residual functioning thyroid
tissue. There is no evidence for distant metastatic disease.
Physiologic activity is noted within the salivary glands, GI and GU
tract.
IMPRESSION: 1. Expected residual functioning thyroid tissue within the thyroid
bed. No evidence for distant metastatic disease.

## 2016-07-01 DIAGNOSIS — I10 Essential (primary) hypertension: Secondary | ICD-10-CM | POA: Insufficient documentation

## 2017-10-04 DIAGNOSIS — E7800 Pure hypercholesterolemia, unspecified: Secondary | ICD-10-CM | POA: Insufficient documentation

## 2018-02-15 ENCOUNTER — Other Ambulatory Visit: Payer: Self-pay | Admitting: Cardiology

## 2018-02-15 ENCOUNTER — Ambulatory Visit
Admission: RE | Admit: 2018-02-15 | Discharge: 2018-02-15 | Disposition: A | Discharge status: Home / Self Care | Payer: Medicare Other | Source: Ambulatory Visit | Attending: Cardiology | Admitting: Cardiology

## 2018-02-15 DIAGNOSIS — R0602 Shortness of breath: Secondary | ICD-10-CM

## 2018-02-15 LAB — POCT I-STAT CREATININE: Creatinine, Ser: 0.7 mg/dL (ref 0.44–1.00)

## 2018-02-15 MED ORDER — IOHEXOL 350 MG/ML SOLN
75.0000 mL | Freq: Once | INTRAVENOUS | Status: AC | PRN
  Administered 2018-02-15: 75 mL via INTRAVENOUS

## 2018-04-17 ENCOUNTER — Encounter: Payer: Self-pay | Admitting: *Deleted

## 2018-04-17 ENCOUNTER — Emergency Department
Admission: EM | Admit: 2018-04-17 | Discharge: 2018-04-17 | Disposition: A | Discharge status: Home / Self Care | Payer: No Typology Code available for payment source | Attending: Emergency Medicine | Admitting: Emergency Medicine

## 2018-04-17 DIAGNOSIS — S060X0A Concussion without loss of consciousness, initial encounter: Secondary | ICD-10-CM | POA: Diagnosis not present

## 2018-04-17 DIAGNOSIS — S199XXA Unspecified injury of neck, initial encounter: Secondary | ICD-10-CM | POA: Diagnosis present

## 2018-04-17 DIAGNOSIS — Y9389 Activity, other specified: Secondary | ICD-10-CM | POA: Diagnosis not present

## 2018-04-17 DIAGNOSIS — Z87891 Personal history of nicotine dependence: Secondary | ICD-10-CM | POA: Insufficient documentation

## 2018-04-17 DIAGNOSIS — Z79899 Other long term (current) drug therapy: Secondary | ICD-10-CM | POA: Insufficient documentation

## 2018-04-17 DIAGNOSIS — S161XXA Strain of muscle, fascia and tendon at neck level, initial encounter: Secondary | ICD-10-CM | POA: Diagnosis not present

## 2018-04-17 DIAGNOSIS — Y92410 Unspecified street and highway as the place of occurrence of the external cause: Secondary | ICD-10-CM | POA: Insufficient documentation

## 2018-04-17 DIAGNOSIS — R110 Nausea: Secondary | ICD-10-CM | POA: Diagnosis not present

## 2018-04-17 DIAGNOSIS — Y998 Other external cause status: Secondary | ICD-10-CM | POA: Insufficient documentation

## 2018-04-17 DIAGNOSIS — M545 Low back pain: Secondary | ICD-10-CM | POA: Insufficient documentation

## 2018-04-17 DIAGNOSIS — Z85850 Personal history of malignant neoplasm of thyroid: Secondary | ICD-10-CM | POA: Diagnosis not present

## 2018-04-17 DIAGNOSIS — R51 Headache: Secondary | ICD-10-CM | POA: Insufficient documentation

## 2018-04-17 DIAGNOSIS — I10 Essential (primary) hypertension: Secondary | ICD-10-CM | POA: Diagnosis not present
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
## 2018-04-17 NOTE — ED Notes (Signed)
Pt states that she does not want a CT, she just wants to be checked neurologically.

## 2018-04-21 ENCOUNTER — Emergency Department
Admission: EM | Admit: 2018-04-21 | Discharge: 2018-04-21 | Disposition: A | Discharge status: Home / Self Care | Payer: No Typology Code available for payment source | Attending: Emergency Medicine | Admitting: Emergency Medicine

## 2018-04-21 ENCOUNTER — Emergency Department: Payer: No Typology Code available for payment source

## 2018-04-21 DIAGNOSIS — M7918 Myalgia, other site: Secondary | ICD-10-CM | POA: Diagnosis not present

## 2018-04-21 DIAGNOSIS — Z79899 Other long term (current) drug therapy: Secondary | ICD-10-CM | POA: Diagnosis not present

## 2018-04-21 DIAGNOSIS — Z87891 Personal history of nicotine dependence: Secondary | ICD-10-CM | POA: Insufficient documentation

## 2018-04-21 DIAGNOSIS — I10 Essential (primary) hypertension: Secondary | ICD-10-CM | POA: Diagnosis not present

## 2018-04-21 DIAGNOSIS — S161XXD Strain of muscle, fascia and tendon at neck level, subsequent encounter: Secondary | ICD-10-CM

## 2018-04-21 DIAGNOSIS — M542 Cervicalgia: Secondary | ICD-10-CM | POA: Diagnosis present

## 2018-04-21 MED ORDER — BACLOFEN 10 MG PO TABS: 10.0000 mg | ORAL_TABLET | Freq: Every day | ORAL | 1 refills | Status: DC
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
## 2018-04-21 NOTE — Discharge Instructions (Signed)
Follow-up with your regular doctor as needed.  Follow-up with orthopedics as needed.  Follow-up with Dr. Francisca December for chiropractic care.  If your chiropractor does not feel that treatment there would be appropriate you should follow-up with orthopedics instead.  Take the medication as prescribed.
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
## 2018-05-10 DIAGNOSIS — M609 Myositis, unspecified: Secondary | ICD-10-CM | POA: Insufficient documentation

## 2018-08-16 ENCOUNTER — Telehealth: Payer: Self-pay

## 2018-08-16 NOTE — Telephone Encounter (Signed)
Pt has an appt scheduled with [facility] 08/17/18 at 1 with Emelda Brothers, MD at the [place] office.  The address is [address]  [phone]  Pt has been made aware by their office

## 2018-08-29 ENCOUNTER — Ambulatory Visit: Payer: Medicare Other | Admitting: Speech Pathology

## 2018-08-31 ENCOUNTER — Ambulatory Visit: Payer: Medicare Other | Admitting: Speech Pathology

## 2018-09-05 ENCOUNTER — Ambulatory Visit: Payer: PRIVATE HEALTH INSURANCE | Admitting: Speech Pathology

## 2018-09-07 ENCOUNTER — Ambulatory Visit: Payer: PRIVATE HEALTH INSURANCE | Admitting: Speech Pathology

## 2018-09-11 ENCOUNTER — Ambulatory Visit: Payer: PRIVATE HEALTH INSURANCE | Admitting: Speech Pathology

## 2018-09-13 ENCOUNTER — Ambulatory Visit: Payer: PRIVATE HEALTH INSURANCE | Admitting: Speech Pathology

## 2018-09-20 ENCOUNTER — Ambulatory Visit: Payer: PRIVATE HEALTH INSURANCE | Admitting: Speech Pathology

## 2018-09-22 ENCOUNTER — Ambulatory Visit: Payer: PRIVATE HEALTH INSURANCE | Admitting: Speech Pathology

## 2018-09-25 ENCOUNTER — Ambulatory Visit: Payer: PRIVATE HEALTH INSURANCE | Admitting: Speech Pathology

## 2018-09-27 ENCOUNTER — Ambulatory Visit: Payer: PRIVATE HEALTH INSURANCE | Admitting: Speech Pathology

## 2018-10-02 ENCOUNTER — Ambulatory Visit: Payer: PRIVATE HEALTH INSURANCE | Admitting: Speech Pathology

## 2018-10-04 ENCOUNTER — Ambulatory Visit: Payer: PRIVATE HEALTH INSURANCE | Admitting: Speech Pathology

## 2018-10-09 ENCOUNTER — Ambulatory Visit: Payer: PRIVATE HEALTH INSURANCE | Admitting: Speech Pathology

## 2018-10-11 ENCOUNTER — Ambulatory Visit: Payer: PRIVATE HEALTH INSURANCE | Admitting: Speech Pathology

## 2018-11-06 ENCOUNTER — Other Ambulatory Visit: Payer: Self-pay | Admitting: Obstetrics and Gynecology

## 2018-11-06 DIAGNOSIS — Z1231 Encounter for screening mammogram for malignant neoplasm of breast: Secondary | ICD-10-CM

## 2018-12-18 ENCOUNTER — Ambulatory Visit
Admission: RE | Admit: 2018-12-18 | Discharge: 2018-12-18 | Disposition: A | Discharge status: Home / Self Care | Payer: Medicare Other | Source: Ambulatory Visit | Attending: Obstetrics and Gynecology | Admitting: Obstetrics and Gynecology

## 2018-12-18 DIAGNOSIS — Z1231 Encounter for screening mammogram for malignant neoplasm of breast: Secondary | ICD-10-CM | POA: Diagnosis present

## 2018-12-25 ENCOUNTER — Inpatient Hospital Stay
Admission: RE | Admit: 2018-12-25 | Discharge: 2018-12-25 | Disposition: A | Discharge status: Home / Self Care | Payer: Self-pay | Source: Ambulatory Visit | Attending: Obstetrics and Gynecology | Admitting: Obstetrics and Gynecology

## 2018-12-25 ENCOUNTER — Other Ambulatory Visit: Payer: Self-pay | Admitting: Obstetrics and Gynecology

## 2018-12-25 DIAGNOSIS — Z1231 Encounter for screening mammogram for malignant neoplasm of breast: Secondary | ICD-10-CM

## 2019-03-26 DIAGNOSIS — N950 Postmenopausal bleeding: Secondary | ICD-10-CM | POA: Insufficient documentation

## 2019-03-27 ENCOUNTER — Ambulatory Visit: Payer: Medicare HMO | Admitting: Podiatry

## 2019-03-27 ENCOUNTER — Other Ambulatory Visit: Payer: Self-pay

## 2019-03-27 ENCOUNTER — Ambulatory Visit (INDEPENDENT_AMBULATORY_CARE_PROVIDER_SITE_OTHER): Payer: Medicare HMO

## 2019-03-27 ENCOUNTER — Other Ambulatory Visit: Payer: Self-pay | Admitting: Podiatry

## 2019-03-27 DIAGNOSIS — M2011 Hallux valgus (acquired), right foot: Secondary | ICD-10-CM

## 2019-03-27 DIAGNOSIS — M205X1 Other deformities of toe(s) (acquired), right foot: Secondary | ICD-10-CM

## 2019-03-27 NOTE — Patient Instructions (Signed)
Pre-Operative Instructions  Congratulations, you have decided to take an important step towards improving your quality of life.  You can be assured that the doctors and staff at [facility] will be with you every step of the way.  Here are some important things you should know:  1. Plan to be at the surgery center/hospital at least 1 (one) hour prior to your scheduled time, unless otherwise directed by the surgical center/hospital staff.  You must have a responsible adult accompany you, remain during the surgery and drive you home.  Make sure you have directions to the surgical center/hospital to ensure you arrive on time. 2. If you are having surgery at [facility] or [facility] hospitals, you will need a copy of your medical history and physical form from your family physician within one month prior to the date of surgery. We will give you a form for your primary physician to complete.  3. We make every effort to accommodate the date you request for surgery.  However, there are times where surgery dates or times have to be moved.  We will contact you as soon as possible if a change in schedule is required.   4. No aspirin/ibuprofen for one week before surgery.  If you are on aspirin, any non-steroidal anti-inflammatory medications (Mobic, Aleve, Ibuprofen) should not be taken seven (7) days prior to your surgery.  You make take Tylenol for pain prior to surgery.  5. Medications - If you are taking daily heart and blood pressure medications, seizure, reflux, allergy, asthma, anxiety, pain or diabetes medications, make sure you notify the surgery center/hospital before the day of surgery so they can tell you which medications you should take or avoid the day of surgery. 6. No food or drink after midnight the night before surgery unless directed otherwise by surgical center/hospital staff. 7. No alcoholic beverages 24-hours prior to surgery.  No smoking 24-hours prior or 24-hours after  surgery. 8. Wear loose pants or shorts. They should be loose enough to fit over bandages, boots, and casts. 9. Don't wear slip-on shoes. Sneakers are preferred. 10. Bring your boot with you to the surgery center/hospital.  Also bring crutches or a walker if your physician has prescribed it for you.  If you do not have this equipment, it will be provided for you after surgery. 11. If you have not been contacted by the surgery center/hospital by the day before your surgery, call to confirm the date and time of your surgery. 12. Leave-time from work may vary depending on the type of surgery you have.  Appropriate arrangements should be made prior to surgery with your employer. 13. Prescriptions will be provided immediately following surgery by your doctor.  Fill these as soon as possible after surgery and take the medication as directed. Pain medications will not be refilled on weekends and must be approved by the doctor. 14. Remove nail polish on the operative foot and avoid getting pedicures prior to surgery. 15. Wash the night before surgery.  The night before surgery wash the foot and leg well with water and the antibacterial soap provided. Be sure to pay special attention to beneath the toenails and in between the toes.  Wash for at least three (3) minutes. Rinse thoroughly with water and dry well with a towel.  Perform this wash unless told not to do so by your physician.  Enclosed: 1 Ice pack (please put in freezer the night before surgery)   1 Hibiclens skin cleaner     Pre-op instructions  If you have any questions regarding the instructions, please do not hesitate to call our office.  [place]: [address], [place], [place] [address] -- [phone]  [place]: [address] [place], [place] [address] -- [phone]  [place]: [address], [place], [place] [address] -- [phone]   Website: [email or web address] 

## 2019-03-28 ENCOUNTER — Telehealth: Payer: Self-pay | Admitting: *Deleted

## 2019-03-28 NOTE — Telephone Encounter (Signed)
"  I'm calling to schedule my surgery with Dr. Amalia Hailey."  Dr. Amalia Hailey' next available date is May 24, 2019.  "Is that the soonest he has?"  Yes, that is his next available date.  "Put me down for then.  Will you call me if there's any cancellations?  I'd like to be moved up."  Yes, I will put you on a waiting list.  "What time will I need to be there?"  I can't give you a time.  Someone from the surgical center will give you a call a day or two prior to your surgery date and will give you your arrival time.  "Can you tell me how long my procedure will take?"  I don't have your paperwork at this time.

## 2019-03-30 NOTE — Progress Notes (Signed)
   HPI: 69 y.o. female presenting today as a new patient with a chief complaint of intermittent burning pain to the right dorsal forefoot that has been ongoing for the past several years. She states the pain sometimes wakes her up at night. She has gotten injections in the past and has used a CAM boot for treatment. Being on the foot increases the pain. Patient is here for further evaluation and treatment.   Past Medical History:  Diagnosis Date  . Cancer ([place])   . GERD (gastroesophageal reflux disease)   . Hepatitis    A-YEARS AGO  . Hypertension      Physical Exam: General: The patient is alert and oriented x3 in no acute distress.  Dermatology: Skin is warm, dry and supple bilateral lower extremities. Negative for open lesions or macerations.  Vascular: Palpable pedal pulses bilaterally. No edema or erythema noted. Capillary refill within normal limits.  Neurological: Epicritic and protective threshold grossly intact bilaterally.   Musculoskeletal Exam: Pain on palpation with limited range of motion noted to the first MPJ right foot. Pain with palpation to the right fibular sesamoid apparatus.  Radiographic Exam: Degenerative changes noted with joint space narrowing first MPJ. There also appears to be extra-articular spurring noted about the joint.     Assessment: 1. Hallux limitus / DJD right 2. Fibular sesamoiditis right    Plan of Care:  1. Patient evaluated. X-Rays reviewed.  2. Today we discussed the conservative versus surgical management of the presenting pathology. The patient opts for surgical management. All possible complications and details of the procedure were explained. All patient questions were answered. No guarantees were expressed or implied. 3. Authorization for surgery was initiated today. Surgery will consist of 1st MPJ arthroplasty with implant right; fibular sesamoidectomy right.  4. Post op shoe dispensed.  5. Return to clinic one week post op.       Edrick Kins, DPM [facility]  Dr. Edrick Kins, DPM    [address], [place] [address]                Office [phone]  Fax [phone]

## 2019-05-10 ENCOUNTER — Telehealth: Payer: Self-pay | Admitting: Podiatry

## 2019-05-10 NOTE — Telephone Encounter (Signed)
DOS: 05/24/2019   SURGICAL PROCEDURES: Vilinda Blanks Implant Right [phone]) and Sesamoidectomy Right [phone]).  [employer] Medicare Effective: 03/16/2019 -  Out of Pocket: $5,000 with $53.50 met and $4,946.50 remains. CoInsurance: 100% / 0%  Automated Ref# [id number]  $200 facility fee co-pay.  Per Megan P no prior authorization is required. Call ref# [id number].

## 2019-05-24 ENCOUNTER — Encounter: Payer: Self-pay | Admitting: Podiatry

## 2019-05-24 ENCOUNTER — Other Ambulatory Visit: Payer: Self-pay | Admitting: Podiatry

## 2019-05-24 DIAGNOSIS — M84871 Other disorders of continuity of bone, right ankle and foot: Secondary | ICD-10-CM

## 2019-05-24 DIAGNOSIS — M2011 Hallux valgus (acquired), right foot: Secondary | ICD-10-CM | POA: Diagnosis not present

## 2019-05-24 MED ORDER — OXYCODONE-ACETAMINOPHEN 5-325 MG PO TABS: 1.0000 | ORAL_TABLET | Freq: Four times a day (QID) | ORAL | 0 refills | Status: DC | PRN

## 2019-05-24 NOTE — Progress Notes (Signed)
PRN postop 

## 2019-06-01 ENCOUNTER — Encounter: Payer: Self-pay | Admitting: Podiatry

## 2019-06-01 ENCOUNTER — Ambulatory Visit (INDEPENDENT_AMBULATORY_CARE_PROVIDER_SITE_OTHER): Payer: Medicare HMO

## 2019-06-01 ENCOUNTER — Other Ambulatory Visit: Payer: Self-pay

## 2019-06-01 ENCOUNTER — Ambulatory Visit (INDEPENDENT_AMBULATORY_CARE_PROVIDER_SITE_OTHER): Payer: Medicare HMO | Admitting: Podiatry

## 2019-06-01 VITALS — Temp 97.6°F

## 2019-06-01 DIAGNOSIS — Z9889 Other specified postprocedural states: Secondary | ICD-10-CM

## 2019-06-01 DIAGNOSIS — M2011 Hallux valgus (acquired), right foot: Secondary | ICD-10-CM

## 2019-06-01 DIAGNOSIS — M205X1 Other deformities of toe(s) (acquired), right foot: Secondary | ICD-10-CM

## 2019-06-04 NOTE — Progress Notes (Signed)
   Subjective:  Patient presents today status post 1st MPJ implant with fibular sesamoidectomy right. DOS: 05/24/2019. She states she is doing well. She reports some mild pain that is tolerated by taking Ibuprofen. She denies any worsening factors and has been using the CAM boot as directed. Patient is here for further evaluation and treatment.    Past Medical History:  Diagnosis Date  . Cancer ([place])   . GERD (gastroesophageal reflux disease)   . Hepatitis    A-YEARS AGO  . Hypertension       Objective/Physical Exam Neurovascular status intact.  Skin incisions appear to be well coapted with sutures and staples intact. No sign of infectious process noted. No dehiscence. No active bleeding noted. Moderate edema noted to the surgical extremity.  Radiographic Exam:  Orthopedic hardware and osteotomies sites appear to be stable with routine healing.  Assessment: 1. s/p 1st MPJ implant with fibular sesamoidectomy right. DOS: 05/24/2019   Plan of Care:  1. Patient was evaluated. X-rays reviewed 2. Unna boot applied for edema. Keep clean, dry and intact for one week.  3. Continue using CAM boot.  4. Return to clinic in one week.    Edrick Kins, DPM [facility]  Dr. Edrick Kins, [address]                                        [place], [place] [address]                Office [phone]  Fax [phone]

## 2019-06-12 ENCOUNTER — Ambulatory Visit (INDEPENDENT_AMBULATORY_CARE_PROVIDER_SITE_OTHER): Payer: Medicare HMO | Admitting: Podiatry

## 2019-06-12 ENCOUNTER — Other Ambulatory Visit: Payer: Self-pay

## 2019-06-12 DIAGNOSIS — Z9889 Other specified postprocedural states: Secondary | ICD-10-CM

## 2019-06-12 DIAGNOSIS — M2011 Hallux valgus (acquired), right foot: Secondary | ICD-10-CM

## 2019-06-12 DIAGNOSIS — M205X1 Other deformities of toe(s) (acquired), right foot: Secondary | ICD-10-CM

## 2019-06-13 NOTE — Progress Notes (Signed)
   Subjective:  Patient presents today status post 1st MPJ implant with fibular sesamoidectomy right. DOS: 05/24/2019. She states she is doing well and improving. She reports some drainage and reports excessive walking which caused a suture to rupture. She has been using the CAM boot as directed. She denies any pain. Patient is here for further evaluation and treatment.   Past Medical History:  Diagnosis Date  . Cancer ([place])   . GERD (gastroesophageal reflux disease)   . Hepatitis    A-YEARS AGO  . Hypertension       Objective/Physical Exam Neurovascular status intact.  Skin incisions appear to be well coapted with sutures and staples intact. No sign of infectious process noted. No dehiscence. No active bleeding noted. Moderate edema noted to the surgical extremity.  Assessment: 1. s/p 1st MPJ implant with fibular sesamoidectomy right. DOS: 05/24/2019   Plan of Care:  1. Patient was evaluated.  2. Discontinue using CAM boot. Post op shoe recommended. Patient has one at home.  3. Ace wrap dispensed.  4. Return to clinic in 4 weeks.    Edrick Kins, DPM [facility]  Dr. Edrick Kins, [address]                                        [place], [place] [address]                Office [phone]  Fax [phone]

## 2019-07-13 ENCOUNTER — Encounter: Payer: Medicare HMO | Admitting: Podiatry

## 2019-07-24 ENCOUNTER — Other Ambulatory Visit: Payer: Self-pay

## 2019-07-24 ENCOUNTER — Encounter: Payer: Self-pay | Admitting: Podiatry

## 2019-07-24 ENCOUNTER — Ambulatory Visit (INDEPENDENT_AMBULATORY_CARE_PROVIDER_SITE_OTHER): Payer: Medicare HMO

## 2019-07-24 ENCOUNTER — Ambulatory Visit (INDEPENDENT_AMBULATORY_CARE_PROVIDER_SITE_OTHER): Payer: Medicare HMO | Admitting: Podiatry

## 2019-07-24 DIAGNOSIS — Z9889 Other specified postprocedural states: Secondary | ICD-10-CM

## 2019-07-24 DIAGNOSIS — M2011 Hallux valgus (acquired), right foot: Secondary | ICD-10-CM

## 2019-07-24 DIAGNOSIS — S82844A Nondisplaced bimalleolar fracture of right lower leg, initial encounter for closed fracture: Secondary | ICD-10-CM

## 2019-07-26 ENCOUNTER — Encounter: Payer: Self-pay | Admitting: Podiatry

## 2019-07-27 NOTE — Telephone Encounter (Signed)
I already spoke with patient. Thanks, Dr. Amalia Hailey

## 2019-07-30 NOTE — Progress Notes (Signed)
   Subjective:  Patient presents today status post 1st MPJ implant with fibular sesamoidectomy right. DOS: 05/24/2019. She states she is doing well and improving.  She states that she continues to have some mild tenderness to the outside of the right ankle which may be attributed to the cam boot.  Otherwise she has no new complaints at this time.  Past Medical History:  Diagnosis Date  . Cancer ([place])   . GERD (gastroesophageal reflux disease)   . Hepatitis    A-YEARS AGO  . Hypertension       Objective/Physical Exam Neurovascular status intact.  Skin incisions appear to be well coapted with sutures and staples intact. No sign of infectious process noted. No dehiscence. No active bleeding noted. Moderate edema noted to the surgical extremity.  Radiographic exam After reviewing the radiographic exam the MTPJ implant with fibular sesamoidectomy is intact with routine healing.  On lateral view there is identified a fracture of the fibular malleolus of the fibula on the right ankle.  Fracture is closed, nondisplaced, spiral oblique.  This ankle fracture was also noted on prior x-rays after reviewing them today at her first postop visit.  Assessment: 1. s/p 1st MPJ implant with fibular sesamoidectomy right. DOS: 05/24/2019 2.  Ankle fracture fibula malleolus right  Plan of Care:  1. Patient was evaluated.  2.  Unfortunately, I was reviewing the x-ray imaging after the patient left.  I contacted the patient and informed her of the ankle fracture and instructed her to resume the cam boot and nonweightbearing.  I offered the patient my apologies and let her know that with postoperative x-rays I was focused on the surgical site and not the ankle.  Her pain was minimal and mild to the ankle joint and I attributed it to possible irritation of the cam boot.  When the patient returns to the clinic we will get full ankle x-rays  3.  Return to clinic in 4 weeks to follow-up on the ankle fracture   Edrick Kins, DPM [facility]  Dr. Edrick Kins, [address]                                        [place], [place] [address]                Office [phone]  Fax [phone]

## 2019-08-20 ENCOUNTER — Encounter: Payer: Self-pay | Admitting: Podiatry

## 2019-08-20 NOTE — Telephone Encounter (Signed)
Patient was scheduled to see Dr. Amalia Hailey on 08/21/19 @ 10:15

## 2019-08-21 ENCOUNTER — Ambulatory Visit: Payer: Medicare HMO

## 2019-08-21 ENCOUNTER — Other Ambulatory Visit: Payer: Self-pay

## 2019-08-21 ENCOUNTER — Encounter: Payer: Self-pay | Admitting: Podiatry

## 2019-08-21 ENCOUNTER — Ambulatory Visit: Payer: Medicare HMO | Admitting: Podiatry

## 2019-08-21 DIAGNOSIS — M2011 Hallux valgus (acquired), right foot: Secondary | ICD-10-CM

## 2019-08-21 DIAGNOSIS — S82844A Nondisplaced bimalleolar fracture of right lower leg, initial encounter for closed fracture: Secondary | ICD-10-CM

## 2019-08-21 DIAGNOSIS — Z9889 Other specified postprocedural states: Secondary | ICD-10-CM

## 2019-08-21 NOTE — Progress Notes (Signed)
   Subjective:  Patient presents today status post 1st MPJ implant with fibular sesamoidectomy right. DOS: 05/24/2019. Patient states that she is doing well but she does have some generalized pain throughout the foot and ankle. I suspect this may be coming from the cam boot rubbing her foot and irritating her foot. She does not have any significant pain to the lateral aspect of the ankle. Patient has been nonweightbearing as directed. She presents today for further treatment evaluation. No new complaints at this time  Past Medical History:  Diagnosis Date  . Cancer ([place])   . GERD (gastroesophageal reflux disease)   . Hepatitis    A-YEARS AGO  . Hypertension       Objective/Physical Exam Neurovascular status intact.  Skin incisions appear to be well coapted and healed to the right first MTPJ. No sign of infectious process noted. Negative for any significant edema noted. Negative for pain on palpation overlying the fibular malleolus. There is some mild pain on palpation throughout the midfoot and ankle joint medially.  Radiographic exam Orthopedic implant stable and has not changed to the first MTPJ of the surgical foot. Ankle x-rays demonstrate routine healing of the minimally displaced lateral malleolus fracture. The fracture has not moved since prior x-rays  Assessment: 1. s/p 1st MPJ implant with fibular sesamoidectomy right. DOS: 05/24/2019 2.  Ankle fracture fibula malleolus right, with routine healing  Plan of Care:  1. Patient was evaluated.  2. At this point I will let the patient begin weightbearing in the cam boot. Patient may transition out of the cam boot over the next week. She has now been in the cam boot since surgery approximately 3 months. #3 order for physical therapy at [facility] physical therapy #4 return to clinic in 6 weeks   Edrick Kins, DPM [facility]  Dr. Edrick Kins, [address]                                         [place], [place] [address]                Office [phone]  Fax [phone]

## 2019-09-04 ENCOUNTER — Ambulatory Visit: Payer: Medicare HMO | Admitting: Podiatry

## 2019-10-02 ENCOUNTER — Encounter: Payer: Medicare HMO | Admitting: Podiatry

## 2019-10-02 ENCOUNTER — Other Ambulatory Visit: Payer: Self-pay

## 2019-10-02 ENCOUNTER — Ambulatory Visit (INDEPENDENT_AMBULATORY_CARE_PROVIDER_SITE_OTHER): Payer: Medicare HMO | Admitting: Podiatry

## 2019-10-02 DIAGNOSIS — Z9889 Other specified postprocedural states: Secondary | ICD-10-CM | POA: Diagnosis not present

## 2019-10-02 DIAGNOSIS — M205X1 Other deformities of toe(s) (acquired), right foot: Secondary | ICD-10-CM

## 2019-10-02 DIAGNOSIS — S82844A Nondisplaced bimalleolar fracture of right lower leg, initial encounter for closed fracture: Secondary | ICD-10-CM | POA: Diagnosis not present

## 2019-10-02 NOTE — Progress Notes (Signed)
   Subjective:  Patient presents today status post 1st MPJ implant with fibular sesamoidectomy right. DOS: 05/24/2019.  Patient has been going to physical therapy with significant improvement.  She states that the pain can be on and off.  She has been wearing good supportive shoes and getting around doing her daily activities.  She has been going to physical therapy which she says has helped no new complaints at this time.  Past Medical History:  Diagnosis Date  . Cancer ([place])   . GERD (gastroesophageal reflux disease)   . Hepatitis    A-YEARS AGO  . Hypertension       Objective/Physical Exam Neurovascular status intact.  Skin incisions appear to be well coapted and healed to the right first MTPJ. No sign of infectious process noted. Negative for any significant edema noted. Negative for pain on palpation overlying the fibular malleolus. There is some mild pain on palpation throughout the midfoot and ankle joint medially.  Edema throughout the right foot and ankle appears to be is mostly resolved.  There is some limited range of motion with plantar flexion first MTPJ right.   Assessment: 1. s/p 1st MPJ implant with fibular sesamoidectomy right. DOS: 05/24/2019 2.  Ankle fracture fibula malleolus right, with routine healing-asymptomatic  Plan of Care:  1. Patient was evaluated.  2.  Continue physical therapy as needed at [facility] 3.  Patient may now resume full activity no restrictions. 4.  Recommend good supportive shoe gear 5.  Return to clinic as needed   Edrick Kins, DPM [facility]  Dr. Edrick Kins, [address]                                        [place], [place] [address]                Office [phone]  Fax [phone]

## 2019-12-04 ENCOUNTER — Other Ambulatory Visit: Payer: Self-pay | Admitting: Internal Medicine

## 2019-12-04 DIAGNOSIS — Z1231 Encounter for screening mammogram for malignant neoplasm of breast: Secondary | ICD-10-CM

## 2019-12-21 ENCOUNTER — Other Ambulatory Visit (HOSPITAL_COMMUNITY): Payer: Self-pay | Admitting: Neurology

## 2019-12-21 DIAGNOSIS — R4189 Other symptoms and signs involving cognitive functions and awareness: Secondary | ICD-10-CM

## 2019-12-25 ENCOUNTER — Ambulatory Visit
Admission: RE | Admit: 2019-12-25 | Discharge: 2019-12-25 | Disposition: A | Discharge status: Home / Self Care | Payer: Medicare HMO | Source: Ambulatory Visit | Attending: Internal Medicine | Admitting: Internal Medicine

## 2019-12-25 ENCOUNTER — Other Ambulatory Visit: Payer: Self-pay

## 2019-12-25 DIAGNOSIS — Z1231 Encounter for screening mammogram for malignant neoplasm of breast: Secondary | ICD-10-CM | POA: Diagnosis present

## 2020-01-17 ENCOUNTER — Other Ambulatory Visit: Payer: Self-pay

## 2020-01-17 ENCOUNTER — Ambulatory Visit: Payer: No Typology Code available for payment source

## 2020-01-17 ENCOUNTER — Ambulatory Visit (HOSPITAL_COMMUNITY)
Admission: RE | Admit: 2020-01-17 | Discharge: 2020-01-17 | Disposition: A | Discharge status: Home / Self Care | Payer: Medicare HMO | Source: Ambulatory Visit | Attending: Neurology | Admitting: Neurology

## 2020-01-17 DIAGNOSIS — R4189 Other symptoms and signs involving cognitive functions and awareness: Secondary | ICD-10-CM | POA: Diagnosis not present

## 2020-05-21 ENCOUNTER — Encounter: Payer: Self-pay | Admitting: *Deleted

## 2020-05-21 DIAGNOSIS — M79676 Pain in unspecified toe(s): Secondary | ICD-10-CM

## 2020-07-10 ENCOUNTER — Other Ambulatory Visit: Payer: Self-pay | Admitting: Student

## 2020-07-10 DIAGNOSIS — S8264XA Nondisplaced fracture of lateral malleolus of right fibula, initial encounter for closed fracture: Secondary | ICD-10-CM

## 2020-07-15 ENCOUNTER — Other Ambulatory Visit: Payer: Self-pay

## 2020-07-15 ENCOUNTER — Other Ambulatory Visit: Payer: Self-pay | Admitting: Physician Assistant

## 2020-07-15 ENCOUNTER — Ambulatory Visit
Admission: RE | Admit: 2020-07-15 | Discharge: 2020-07-15 | Disposition: A | Discharge status: Home / Self Care | Payer: Medicare HMO | Source: Ambulatory Visit | Attending: Physician Assistant | Admitting: Physician Assistant

## 2020-07-15 DIAGNOSIS — M5136 Other intervertebral disc degeneration, lumbar region: Secondary | ICD-10-CM | POA: Diagnosis present

## 2020-07-15 DIAGNOSIS — M5441 Lumbago with sciatica, right side: Secondary | ICD-10-CM

## 2020-08-17 ENCOUNTER — Other Ambulatory Visit: Payer: Self-pay

## 2020-08-17 ENCOUNTER — Emergency Department
Admission: EM | Admit: 2020-08-17 | Discharge: 2020-08-17 | Disposition: A | Discharge status: Home / Self Care | Payer: Medicare HMO | Attending: Emergency Medicine | Admitting: Emergency Medicine

## 2020-08-17 ENCOUNTER — Emergency Department: Payer: Medicare HMO

## 2020-08-17 DIAGNOSIS — W19XXXA Unspecified fall, initial encounter: Secondary | ICD-10-CM

## 2020-08-17 DIAGNOSIS — Y92007 Garden or yard of unspecified non-institutional (private) residence as the place of occurrence of the external cause: Secondary | ICD-10-CM | POA: Insufficient documentation

## 2020-08-17 DIAGNOSIS — I10 Essential (primary) hypertension: Secondary | ICD-10-CM | POA: Insufficient documentation

## 2020-08-17 DIAGNOSIS — Z79899 Other long term (current) drug therapy: Secondary | ICD-10-CM | POA: Diagnosis not present

## 2020-08-17 DIAGNOSIS — S0083XA Contusion of other part of head, initial encounter: Secondary | ICD-10-CM | POA: Diagnosis not present

## 2020-08-17 DIAGNOSIS — E876 Hypokalemia: Secondary | ICD-10-CM | POA: Insufficient documentation

## 2020-08-17 DIAGNOSIS — S0990XA Unspecified injury of head, initial encounter: Secondary | ICD-10-CM | POA: Diagnosis present

## 2020-08-17 DIAGNOSIS — W01198A Fall on same level from slipping, tripping and stumbling with subsequent striking against other object, initial encounter: Secondary | ICD-10-CM | POA: Insufficient documentation

## 2020-08-17 DIAGNOSIS — E871 Hypo-osmolality and hyponatremia: Secondary | ICD-10-CM | POA: Diagnosis not present

## 2020-08-17 DIAGNOSIS — Z87891 Personal history of nicotine dependence: Secondary | ICD-10-CM | POA: Insufficient documentation

## 2020-08-17 DIAGNOSIS — Z7982 Long term (current) use of aspirin: Secondary | ICD-10-CM | POA: Diagnosis not present

## 2020-08-17 DIAGNOSIS — Z85850 Personal history of malignant neoplasm of thyroid: Secondary | ICD-10-CM | POA: Diagnosis not present

## 2020-08-17 LAB — BASIC METABOLIC PANEL
Anion gap: 14 (ref 5–15)
BUN: 12 mg/dL (ref 8–23)
CO2: 21 mmol/L — ABNORMAL LOW (ref 22–32)
Calcium: 10.1 mg/dL (ref 8.9–10.3)
Chloride: 95 mmol/L — ABNORMAL LOW (ref 98–111)
Creatinine, Ser: 1.16 mg/dL — ABNORMAL HIGH (ref 0.44–1.00)
GFR, Estimated: 51 mL/min — ABNORMAL LOW (ref >60)
Glucose, Bld: 96 mg/dL (ref 70–99)
Potassium: 2.7 mmol/L — CL (ref 3.5–5.1)
Sodium: 130 mmol/L — ABNORMAL LOW (ref 135–145)

## 2020-08-17 LAB — CBC
HCT: 38.6 % (ref 36.0–46.0)
Hemoglobin: 13.7 g/dL (ref 12.0–15.0)
MCH: 28.5 pg (ref 26.0–34.0)
MCHC: 35.5 g/dL (ref 30.0–36.0)
MCV: 80.4 fL (ref 80.0–100.0)
Platelets: 282 10*3/uL (ref 150–400)
RBC: 4.8 MIL/uL (ref 3.87–5.11)
RDW: 12.6 % (ref 11.5–15.5)
WBC: 8.3 10*3/uL (ref 4.0–10.5)
nRBC: 0 % (ref 0.0–0.2)

## 2020-08-17 LAB — URINALYSIS, COMPLETE (UACMP) WITH MICROSCOPIC
Bacteria, UA: NONE SEEN
Bilirubin Urine: NEGATIVE
Glucose, UA: NEGATIVE mg/dL
Hgb urine dipstick: NEGATIVE
Ketones, ur: 5 mg/dL — AB
Leukocytes,Ua: NEGATIVE
Nitrite: NEGATIVE
Protein, ur: NEGATIVE mg/dL
Specific Gravity, Urine: 1.006 (ref 1.005–1.030)
pH: 6 (ref 5.0–8.0)

## 2020-08-17 MED ORDER — POTASSIUM CHLORIDE CRYS ER 20 MEQ PO TBCR
40.0000 meq | EXTENDED_RELEASE_TABLET | Freq: Once | ORAL | Status: AC
  Administered 2020-08-17: 40 meq via ORAL
  Filled 2020-08-17: qty 2

## 2020-08-17 MED ORDER — ONDANSETRON HCL 4 MG/2ML IJ SOLN
4.0000 mg | Freq: Once | INTRAMUSCULAR | Status: AC
  Administered 2020-08-17: 4 mg via INTRAVENOUS
  Filled 2020-08-17: qty 2

## 2020-08-17 MED ORDER — SODIUM CHLORIDE 0.9 % IV BOLUS
1000.0000 mL | Freq: Once | INTRAVENOUS | Status: AC
  Administered 2020-08-17: 1000 mL via INTRAVENOUS

## 2020-08-17 MED ORDER — MECLIZINE HCL 25 MG PO TABS: 25.0000 mg | ORAL_TABLET | Freq: Three times a day (TID) | ORAL | 0 refills | Status: DC | PRN

## 2020-08-17 NOTE — ED Triage Notes (Signed)
Pt to ED POV for fall an hour ago, hitting head, takes asa daily, hematoma noted to back of head.  States has been dizzy intermittently for a few days. Unsure what made her fall NAD noted

## 2020-08-17 NOTE — ED Provider Notes (Signed)
[facility] Emergency Department Provider Note   ____________________________________________   I have reviewed the triage vital signs and the nursing notes.   HISTORY  Chief Complaint Fall   History limited by: Not Limited   HPI Gabrielle Davidson is a 70 y.o. female who presents to the emergency department today after suffering a fall.  The patient had been out in the garden and when she was going in the side she fell down and hit the back of her head.  She does not completely remember the events.  She did suffer a hematoma to the back of her head.  Patient does states that she has been having intermittent episodes of dizziness and lightheadedness.  It has seemed to be worse when she moves positions.  Additionally the patient states over the past 3 days she has been on a water only fast.  Patient denies any chest pain or palpitations.  Denies any significant headache.  Records reviewed. Per medical record review patient has a history of HTN.  Past Medical History:  Diagnosis Date  . Cancer ([place])   . GERD (gastroesophageal reflux disease)   . Hepatitis    A-YEARS AGO  . Hypertension     Patient Active Problem List   Diagnosis Date Noted  . Post-menopausal bleeding 03/26/2019  . 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 Pure hypercholesterolemia 10/04/2017  . Hypertension 07/01/2016  . Adjustment disorder with anxious mood 01/13/2016  . 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 Thyroid cancer ([place]) 03/26/2015    Past Surgical History:  Procedure Laterality Date  . NO PAST SURGERIES    . THYROIDECTOMY Bilateral 03/26/2015   Procedure: THYROIDECTOMY;  Surgeon: Clyde Canterbury, MD;  Location: [facility] ORS;  Service: ENT;  Laterality: Bilateral;    Prior to Admission medications   Medication Sig Start Date End Date Taking? Authorizing Provider  aspirin EC 81 MG tablet Take by mouth.    [provider]  chlorthalidone (HYGROTON) 25 MG tablet Take by  mouth. 10/11/18 10/11/19  [provider]  estradiol (ESTRACE) 1 MG tablet Take by mouth. 04/18/18   [provider]  FLUoxetine (PROZAC) 40 MG capsule Take 40 mg by mouth every morning.     [provider]  levothyroxine (SYNTHROID) 75 MCG tablet Take by mouth. 01/10/15   [provider]  losartan (COZAAR) 100 MG tablet Take by mouth. 04/13/18 04/13/19  [provider]  Multiple Vitamin (MULTI-VITAMIN) tablet Take by mouth.    [provider]  oxyCODONE-acetaminophen (PERCOCET) 5-325 MG tablet Take 1 tablet by mouth every 6 (six) hours as needed for severe pain. 05/24/19   Edrick Kins, DPM  progesterone (PROMETRIUM) 100 MG capsule Take by mouth. 04/18/18   [provider]  vitamin B-12 (CYANOCOBALAMIN) 1000 MCG tablet Take by mouth.    [provider]    Allergies Patient has no known allergies.  No family history on file.  Social History Social History   Tobacco Use  . Smoking status: Former Smoker    Packs/day: 1.00    Years: 20.00    Pack years: 20.00    Types: Cigarettes    Quit date: 03/21/1983    Years since quitting: 37.4  . Smokeless tobacco: Never Used  Substance Use Topics  . Alcohol use: No    Comment: RARE  . Drug use: No    Review of Systems Constitutional: No fever/chills Eyes: No visual changes. ENT: No sore throat. Cardiovascular: Denies chest pain. Respiratory: Denies shortness of breath.  Gastrointestinal: No abdominal pain.  No nausea, no vomiting.  No diarrhea.   Genitourinary: Negative for dysuria. Musculoskeletal: Negative for back pain. Skin: Negative for rash. Neurological: Positive for dizziness. ____________________________________________   PHYSICAL EXAM:  VITAL SIGNS: ED Triage Vitals  Enc Vitals Group     BP 08/17/20 1451 108/66     Pulse Rate 08/17/20 1445 77     Resp 08/17/20 1444 20     Temp 08/17/20 1444 (!) 97.5 F (36.4 C)     Temp Source 08/17/20 1444 Oral      SpO2 08/17/20 1445 95 %     Weight 08/17/20 1444 146 lb (66.2 kg)     Height 08/17/20 1444 4\' 11"  (1.499 m)     Head Circumference --      Peak Flow --      Pain Score 08/17/20 1444 7   Constitutional: Alert and oriented.  Eyes: Conjunctivae are normal.  ENT      Head: Normocephalic. Small hematoma to occiput.       Nose: No congestion/rhinnorhea.      Mouth/Throat: Mucous membranes are moist.      Neck: No stridor. No midline tenderness.  Hematological/Lymphatic/Immunilogical: No cervical lymphadenopathy. Cardiovascular: Normal rate, regular rhythm.  No murmurs, rubs, or gallops.  Respiratory: Normal respiratory effort without tachypnea nor retractions. Breath sounds are clear and equal bilaterally. No wheezes/rales/rhonchi. Gastrointestinal: Soft and non tender. No rebound. No guarding.  Genitourinary: Deferred Musculoskeletal: Normal range of motion in all extremities. No lower extremity edema. Neurologic:  Normal speech and language. No gross focal neurologic deficits are appreciated.  Skin:  Skin is warm, dry and intact. No rash noted. Psychiatric: Mood and affect are normal. Speech and behavior are normal. Patient exhibits appropriate insight and judgment.  ____________________________________________    LABS (pertinent positives/negatives)  CBC wbc 8.3, hgb 13.7, plt 282 BMP na 130, k 2.7, cl 95, cr 1.16  ____________________________________________   EKG  I, Nance Pear, attending physician, personally viewed and interpreted this EKG  EKG Time: 1452 Rate: 77 Rhythm: normal sinus rhythm Axis: normal Intervals: qtc 466 QRS: narrow, q waves II, III, aVF ST changes: no st elevation Impression: abnormal ekg ____________________________________________    RADIOLOGY  CT head No acute osseous abnormality. No acute intracranial  abnormality. ____________________________________________   PROCEDURES  Procedures  ____________________________________________   INITIAL IMPRESSION / ASSESSMENT AND PLAN / ED COURSE  Pertinent labs & imaging results that were available during my care of the patient were reviewed by me and considered in my medical decision making (see chart for details).   Patient presented to the emergency department today because of concerns for a fall.  Patient does state that she has been having intermittent episodes of dizziness over the past few weeks and additionally has been on a water only passive over the past 3 days.  Work-up here is consistent with a water only fast with hyponatremia hypochloremia and hypokalemia.  I do think likely some dehydration contributed for weakness and fall.  Patient was given potassium and IV fluids here.  Additionally in terms of the dizziness I do wonder if the patient's symptoms are related to vertigo given that it is worse with change position.  Will give patient prescription for Antivert and ENT follow-up.  ____________________________________________   FINAL CLINICAL IMPRESSION(S) / ED DIAGNOSES  Final diagnoses:  Fall, initial encounter  Hypokalemia  Hyponatremia     Note: This dictation was prepared with Dragon dictation. Any transcriptional errors that result from this  process are unintentional     Nance Pear, MD 08/17/20 [phone]

## 2020-08-17 NOTE — Discharge Instructions (Addendum)
Please seek medical attention for any high fevers, chest pain, shortness of breath, change in behavior, persistent vomiting, bloody stool or any other new or concerning symptoms.  

## 2020-08-19 ENCOUNTER — Other Ambulatory Visit: Payer: Self-pay | Admitting: Neurological Surgery

## 2020-09-09 NOTE — Progress Notes (Signed)
Surgical Instructions    Your procedure is scheduled on 09/16/20.  Report to [facility] Main Entrance "A" at 05:30 A.M., then check in with the Admitting office.  Call this number if you have problems the morning of surgery:  [phone]   If you have any questions prior to your surgery date call [phone]: Open Monday-Friday 8am-4pm    Remember:  Do not eat or drink after midnight the night before your surgery      Take these medicines the morning of surgery with A SIP OF WATER  estradiol (ESTRACE) FLUoxetine (PROZAC)  gabapentin (NEURONTIN) levothyroxine (SYNTHROID)  progesterone (PROMETRIUM)  tiZANidine (ZANAFLEX) if needed traMADol (ULTRAM)  if needed   As of today, STOP taking any Aspirin (unless otherwise instructed by your surgeon) Aleve, Naproxen, Ibuprofen, Motrin, Advil, Goody's, BC's, all herbal medications, fish oil, and all vitamins.          Do not wear jewelry or makeup Do not wear lotions, powders, perfumes, or deodorant. Do not shave 48 hours prior to surgery.   Do not bring valuables to the hospital.  DO Not wear nail polish, gel polish, artificial nails, or any other type of covering on natural nails  including finger and toenails. If patients have artificial nails, gel coating, etc. that need to be removed by a nail salon please have this removed prior to surgery or surgery may need to be canceled/delayed if the surgeon/ anesthesia feels like the patient is unable to be adequately monitored.             [facility] is not responsible for any belongings or valuables.  Do NOT Smoke (Tobacco/Vaping) or drink Alcohol 24 hours prior to your procedure If you use a CPAP at night, you may bring all equipment for your overnight stay.   Contacts, glasses, dentures or bridgework may not be worn into surgery, please bring cases for these belongings   For patients admitted to the hospital, discharge time will be determined by your treatment team.   Patients  discharged the day of surgery will not be allowed to drive home, and someone needs to stay with them for 24 hours.  ONLY 1 SUPPORT PERSON MAY BE PRESENT WHILE YOU ARE IN SURGERY. IF YOU ARE TO BE ADMITTED ONCE YOU ARE IN YOUR ROOM YOU WILL BE ALLOWED TWO (2) VISITORS.  Minor children may have two parents present. Special consideration for safety and communication needs will be reviewed on a case by case basis.  Special instructions:    Oral Hygiene is also important to reduce your risk of infection.  Remember - BRUSH YOUR TEETH THE MORNING OF SURGERY WITH YOUR REGULAR TOOTHPASTE   [facility]- Preparing For Surgery  Before surgery, you can play an important role. Because skin is not sterile, your skin needs to be as free of germs as possible. You can reduce the number of germs on your skin by washing with CHG (chlorahexidine gluconate) Soap before surgery.  CHG is an antiseptic cleaner which kills germs and bonds with the skin to continue killing germs even after washing.     Please do not use if you have an allergy to CHG or antibacterial soaps. If your skin becomes reddened/irritated stop using the CHG.  Do not shave (including legs and underarms) for at least 48 hours prior to first CHG shower. It is OK to shave your face.  Please follow these instructions carefully.     Shower the NIGHT BEFORE SURGERY and the MORNING OF  SURGERY with CHG Soap.   If you chose to wash your hair, wash your hair first as usual with your normal shampoo. After you shampoo, rinse your hair and body thoroughly to remove the shampoo.  Then [employer] and genitals (private parts) with your normal soap and rinse thoroughly to remove soap.  After that Use CHG Soap as you would any other liquid soap. You can apply CHG directly to the skin and wash gently with a scrungie or a clean washcloth.   Apply the CHG Soap to your body ONLY FROM THE NECK DOWN.  Do not use on open wounds or open sores. Avoid contact with your  eyes, ears, mouth and genitals (private parts). Wash Face and genitals (private parts)  with your normal soap.   Wash thoroughly, paying special attention to the area where your surgery will be performed.  Thoroughly rinse your body with warm water from the neck down.  DO NOT shower/wash with your normal soap after using and rinsing off the CHG Soap.  Pat yourself dry with a CLEAN TOWEL.  Wear CLEAN PAJAMAS to bed the night before surgery  Place CLEAN SHEETS on your bed the night before your surgery  DO NOT SLEEP WITH PETS.   Day of Surgery: Take a shower with CHG soap. Wear Clean/Comfortable clothing the morning of surgery Do not apply any deodorants/lotions.   Remember to brush your teeth WITH YOUR REGULAR TOOTHPASTE.   Please read over the following fact sheets that you were given.

## 2020-09-10 ENCOUNTER — Other Ambulatory Visit: Payer: Self-pay

## 2020-09-10 ENCOUNTER — Encounter (HOSPITAL_COMMUNITY)
Admission: RE | Admit: 2020-09-10 | Discharge: 2020-09-10 | Disposition: A | Discharge status: Home / Self Care | Payer: Medicare HMO | Source: Ambulatory Visit | Attending: Neurological Surgery | Admitting: Neurological Surgery

## 2020-09-10 ENCOUNTER — Encounter (HOSPITAL_COMMUNITY): Payer: Self-pay

## 2020-09-10 DIAGNOSIS — Z01812 Encounter for preprocedural laboratory examination: Secondary | ICD-10-CM | POA: Insufficient documentation

## 2020-09-10 HISTORY — DX: Hypothyroidism, unspecified: E03.9

## 2020-09-10 LAB — CBC
HCT: 40 % (ref 36.0–46.0)
Hemoglobin: 13.3 g/dL (ref 12.0–15.0)
MCH: 28.3 pg (ref 26.0–34.0)
MCHC: 33.3 g/dL (ref 30.0–36.0)
MCV: 85.1 fL (ref 80.0–100.0)
Platelets: 277 10*3/uL (ref 150–400)
RBC: 4.7 MIL/uL (ref 3.87–5.11)
RDW: 12.9 % (ref 11.5–15.5)
WBC: 6.6 10*3/uL (ref 4.0–10.5)
nRBC: 0 % (ref 0.0–0.2)

## 2020-09-10 LAB — COMPREHENSIVE METABOLIC PANEL
ALT: 35 U/L (ref 0–44)
AST: 25 U/L (ref 15–41)
Albumin: 3.7 g/dL (ref 3.5–5.0)
Alkaline Phosphatase: 46 U/L (ref 38–126)
Anion gap: 8 (ref 5–15)
BUN: 13 mg/dL (ref 8–23)
CO2: 27 mmol/L (ref 22–32)
Calcium: 10.4 mg/dL — ABNORMAL HIGH (ref 8.9–10.3)
Chloride: 101 mmol/L (ref 98–111)
Creatinine, Ser: 0.89 mg/dL (ref 0.44–1.00)
GFR, Estimated: >60 mL/min (ref >60)
Glucose, Bld: 115 mg/dL — ABNORMAL HIGH (ref 70–99)
Potassium: 3.2 mmol/L — ABNORMAL LOW (ref 3.5–5.1)
Sodium: 136 mmol/L (ref 135–145)
Total Bilirubin: 0.7 mg/dL (ref 0.3–1.2)
Total Protein: 6.3 g/dL — ABNORMAL LOW (ref 6.5–8.1)

## 2020-09-10 LAB — SURGICAL PCR SCREEN
MRSA, PCR: NEGATIVE
Staphylococcus aureus: NEGATIVE

## 2020-09-10 NOTE — Progress Notes (Signed)
PCP: Fulton Reek, MD Cardiologist:  EKG: 08/17/20 CXR: na ECHO: denies Stress Test: 02/27/18 [facility] Cardiac Cath: denies  Fasting Blood Sugar- na Checks Blood Sugar_na__ times a day  OSA/CPAP: No  ASA: Patient to call office today for insturctions  Covid test 09/12/20 at [facility]  Anesthesia Review: No  Patient denies shortness of breath, fever, cough, and chest pain at PAT appointment.  Patient verbalized understanding of instructions provided today at the PAT appointment.  Patient asked to review instructions at home and day of surgery.

## 2020-09-12 ENCOUNTER — Other Ambulatory Visit: Payer: Self-pay

## 2020-09-12 ENCOUNTER — Other Ambulatory Visit
Admission: RE | Admit: 2020-09-12 | Discharge: 2020-09-12 | Disposition: A | Discharge status: Home / Self Care | Payer: Medicare HMO | Source: Ambulatory Visit | Attending: Neurological Surgery | Admitting: Neurological Surgery

## 2020-09-12 DIAGNOSIS — Z01812 Encounter for preprocedural laboratory examination: Secondary | ICD-10-CM | POA: Diagnosis present

## 2020-09-12 DIAGNOSIS — Z20822 Contact with and (suspected) exposure to covid-19: Secondary | ICD-10-CM | POA: Insufficient documentation

## 2020-09-12 LAB — SARS CORONAVIRUS 2 (TAT 6-24 HRS): SARS Coronavirus 2: NEGATIVE

## 2020-09-15 NOTE — Anesthesia Preprocedure Evaluation (Addendum)
Anesthesia Evaluation  Patient identified by MRN, date of birth, ID band Patient awake    Reviewed: Allergy & Precautions, NPO status , Patient's Chart, lab work & pertinent test results  History of Anesthesia Complications Negative for: history of anesthetic complications  Airway Mallampati: II  TM Distance: >3 FB Neck ROM: Full    Dental no notable dental hx.    Pulmonary neg pulmonary ROS, former smoker,    Pulmonary exam normal        Cardiovascular hypertension, Pt. on medications Normal cardiovascular exam     Neuro/Psych Lumbar radiculopathy    GI/Hepatic Neg liver ROS, GERD  Controlled,  Endo/Other  Hypothyroidism   Renal/GU negative Renal ROS  negative genitourinary   Musculoskeletal negative musculoskeletal ROS (+)   Abdominal   Peds  Hematology negative hematology ROS (+)   Anesthesia Other Findings K 3.2  Reproductive/Obstetrics negative OB ROS                           Anesthesia Physical Anesthesia Plan  ASA: 2  Anesthesia Plan: General   Post-op Pain Management:    Induction: Intravenous  PONV Risk Score and Plan: 4 or greater and Treatment may vary due to age or medical condition, Ondansetron, Dexamethasone and Midazolam  Airway Management Planned: Oral ETT  Additional Equipment: None  Intra-op Plan:   Post-operative Plan: Extubation in OR  Informed Consent: I have reviewed the patients History and Physical, chart, labs and discussed the procedure including the risks, benefits and alternatives for the proposed anesthesia with the patient or authorized representative who has indicated his/her understanding and acceptance.     Dental advisory given  Plan Discussed with: CRNA  Anesthesia Plan Comments:        Anesthesia Quick Evaluation

## 2020-09-16 ENCOUNTER — Encounter (HOSPITAL_COMMUNITY)
Admission: RE | Disposition: A | Discharge status: Home / Self Care | Payer: Self-pay | Source: Home / Self Care | Attending: Neurological Surgery

## 2020-09-16 ENCOUNTER — Observation Stay (HOSPITAL_COMMUNITY)
Admission: RE | Admit: 2020-09-16 | Discharge: 2020-09-17 | Disposition: A | Discharge status: Home / Self Care | Payer: Medicare HMO | Attending: Neurological Surgery | Admitting: Neurological Surgery

## 2020-09-16 ENCOUNTER — Ambulatory Visit (HOSPITAL_COMMUNITY): Payer: Medicare HMO | Admitting: Anesthesiology

## 2020-09-16 ENCOUNTER — Ambulatory Visit (HOSPITAL_COMMUNITY): Payer: Medicare HMO

## 2020-09-16 ENCOUNTER — Other Ambulatory Visit: Payer: Self-pay

## 2020-09-16 ENCOUNTER — Encounter (HOSPITAL_COMMUNITY): Payer: Self-pay | Admitting: Neurological Surgery

## 2020-09-16 DIAGNOSIS — E039 Hypothyroidism, unspecified: Secondary | ICD-10-CM | POA: Diagnosis not present

## 2020-09-16 DIAGNOSIS — Z87891 Personal history of nicotine dependence: Secondary | ICD-10-CM | POA: Diagnosis not present

## 2020-09-16 DIAGNOSIS — I10 Essential (primary) hypertension: Secondary | ICD-10-CM | POA: Insufficient documentation

## 2020-09-16 DIAGNOSIS — Z859 Personal history of malignant neoplasm, unspecified: Secondary | ICD-10-CM | POA: Diagnosis not present

## 2020-09-16 DIAGNOSIS — Z419 Encounter for procedure for purposes other than remedying health state, unspecified: Secondary | ICD-10-CM

## 2020-09-16 DIAGNOSIS — M5116 Intervertebral disc disorders with radiculopathy, lumbar region: Principal | ICD-10-CM | POA: Insufficient documentation

## 2020-09-16 DIAGNOSIS — M5416 Radiculopathy, lumbar region: Secondary | ICD-10-CM | POA: Diagnosis present

## 2020-09-16 HISTORY — PX: LUMBAR LAMINECTOMY/ DECOMPRESSION WITH MET-RX: SHX5959

## 2020-09-16 SURGERY — LUMBAR LAMINECTOMY/ DECOMPRESSION WITH MET-RX
Anesthesia: General | Site: Spine Lumbar | Laterality: Right

## 2020-09-16 MED ORDER — CHLORTHALIDONE 25 MG PO TABS
25.0000 mg | ORAL_TABLET | Freq: Every day | ORAL | Status: DC
  Administered 2020-09-16: 25 mg via ORAL
  Filled 2020-09-16 (×3): qty 1

## 2020-09-16 MED ORDER — DEXAMETHASONE SODIUM PHOSPHATE 10 MG/ML IJ SOLN
INTRAMUSCULAR | Status: DC | PRN
  Administered 2020-09-16: 10 mg via INTRAVENOUS

## 2020-09-16 MED ORDER — LACTATED RINGERS IV SOLN: INTRAVENOUS | Status: DC

## 2020-09-16 MED ORDER — ROCURONIUM BROMIDE 10 MG/ML (PF) SYRINGE
PREFILLED_SYRINGE | INTRAVENOUS | Status: DC | PRN
  Administered 2020-09-16: 50 mg via INTRAVENOUS

## 2020-09-16 MED ORDER — ESTRADIOL 1 MG PO TABS
1.0000 mg | ORAL_TABLET | Freq: Every day | ORAL | Status: DC
  Filled 2020-09-16 (×3): qty 1

## 2020-09-16 MED ORDER — ACETAMINOPHEN 650 MG RE SUPP: 650.0000 mg | RECTAL | Status: DC | PRN

## 2020-09-16 MED ORDER — LIDOCAINE 2% (20 MG/ML) 5 ML SYRINGE
INTRAMUSCULAR | Status: AC
  Filled 2020-09-16: qty 5

## 2020-09-16 MED ORDER — MIDAZOLAM HCL 2 MG/2ML IJ SOLN
INTRAMUSCULAR | Status: DC | PRN
  Administered 2020-09-16: 2 mg via INTRAVENOUS

## 2020-09-16 MED ORDER — CEFAZOLIN SODIUM-DEXTROSE 2-4 GM/100ML-% IV SOLN
2.0000 g | INTRAVENOUS | Status: AC
  Administered 2020-09-16: 2 g via INTRAVENOUS
  Filled 2020-09-16: qty 100

## 2020-09-16 MED ORDER — POLYETHYLENE GLYCOL 3350 17 G PO PACK: 17.0000 g | PACK | Freq: Every day | ORAL | Status: DC | PRN

## 2020-09-16 MED ORDER — PHENYLEPHRINE HCL-NACL 10-0.9 MG/250ML-% IV SOLN
INTRAVENOUS | Status: DC | PRN
  Administered 2020-09-16: 20 ug/min via INTRAVENOUS

## 2020-09-16 MED ORDER — MIDAZOLAM HCL 2 MG/2ML IJ SOLN
INTRAMUSCULAR | Status: AC
  Filled 2020-09-16: qty 2

## 2020-09-16 MED ORDER — PROPOFOL 10 MG/ML IV BOLUS
INTRAVENOUS | Status: AC
  Filled 2020-09-16: qty 40

## 2020-09-16 MED ORDER — OXYCODONE HCL 5 MG PO TABS: 5.0000 mg | ORAL_TABLET | Freq: Once | ORAL | Status: DC | PRN

## 2020-09-16 MED ORDER — DOCUSATE SODIUM 100 MG PO CAPS
100.0000 mg | ORAL_CAPSULE | Freq: Two times a day (BID) | ORAL | Status: DC
  Administered 2020-09-16 (×2): 100 mg via ORAL
  Filled 2020-09-16 (×2): qty 1

## 2020-09-16 MED ORDER — ACETAMINOPHEN 325 MG PO TABS
650.0000 mg | ORAL_TABLET | ORAL | Status: DC | PRN
  Administered 2020-09-16 – 2020-09-17 (×2): 650 mg via ORAL
  Filled 2020-09-16 (×2): qty 2

## 2020-09-16 MED ORDER — PROPOFOL 10 MG/ML IV BOLUS
INTRAVENOUS | Status: DC | PRN
  Administered 2020-09-16: 130 mg via INTRAVENOUS

## 2020-09-16 MED ORDER — ESTRADIOL 0.1 MG/GM VA CREA
1.0000 | TOPICAL_CREAM | Freq: Every day | VAGINAL | Status: DC
  Administered 2020-09-16: 1 via VAGINAL
  Filled 2020-09-16: qty 42.5

## 2020-09-16 MED ORDER — PHENOL 1.4 % MT LIQD
1.0000 | OROMUCOSAL | Status: DC | PRN
Start: 2020-09-16 — End: 2020-09-17

## 2020-09-16 MED ORDER — OXYCODONE HCL 5 MG PO TABS
5.0000 mg | ORAL_TABLET | ORAL | Status: DC | PRN
  Administered 2020-09-16 – 2020-09-17 (×2): 5 mg via ORAL
  Filled 2020-09-16 (×2): qty 1

## 2020-09-16 MED ORDER — SODIUM CHLORIDE 0.9% FLUSH: 3.0000 mL | INTRAVENOUS | Status: DC | PRN

## 2020-09-16 MED ORDER — CHLORHEXIDINE GLUCONATE CLOTH 2 % EX PADS: 6.0000 | MEDICATED_PAD | Freq: Once | CUTANEOUS | Status: DC

## 2020-09-16 MED ORDER — PROMETHAZINE HCL 25 MG/ML IJ SOLN: 6.2500 mg | INTRAMUSCULAR | Status: DC | PRN

## 2020-09-16 MED ORDER — SODIUM CHLORIDE 0.9% FLUSH
3.0000 mL | Freq: Two times a day (BID) | INTRAVENOUS | Status: DC
  Administered 2020-09-16: 3 mL via INTRAVENOUS

## 2020-09-16 MED ORDER — THROMBIN 5000 UNITS EX SOLR
OROMUCOSAL | Status: DC | PRN
  Administered 2020-09-16: 5 mL via TOPICAL

## 2020-09-16 MED ORDER — ONDANSETRON HCL 4 MG/2ML IJ SOLN
INTRAMUSCULAR | Status: AC
  Filled 2020-09-16: qty 2

## 2020-09-16 MED ORDER — ACETAMINOPHEN 500 MG PO TABS
1000.0000 mg | ORAL_TABLET | Freq: Once | ORAL | Status: AC
  Administered 2020-09-16: 1000 mg via ORAL
  Filled 2020-09-16: qty 2

## 2020-09-16 MED ORDER — SODIUM CHLORIDE 0.9 % IV SOLN: 250.0000 mL | INTRAVENOUS | Status: DC

## 2020-09-16 MED ORDER — SUGAMMADEX SODIUM 200 MG/2ML IV SOLN
INTRAVENOUS | Status: DC | PRN
  Administered 2020-09-16: 200 mg via INTRAVENOUS

## 2020-09-16 MED ORDER — HYDROMORPHONE HCL 1 MG/ML IJ SOLN: 1.0000 mg | INTRAMUSCULAR | Status: DC | PRN

## 2020-09-16 MED ORDER — OXYCODONE HCL 5 MG/5ML PO SOLN: 5.0000 mg | Freq: Once | ORAL | Status: DC | PRN

## 2020-09-16 MED ORDER — ONDANSETRON HCL 4 MG/2ML IJ SOLN: 4.0000 mg | Freq: Four times a day (QID) | INTRAMUSCULAR | Status: DC | PRN

## 2020-09-16 MED ORDER — GABAPENTIN 300 MG PO CAPS
300.0000 mg | ORAL_CAPSULE | Freq: Three times a day (TID) | ORAL | Status: DC
  Administered 2020-09-16 (×2): 300 mg via ORAL
  Filled 2020-09-16 (×2): qty 1

## 2020-09-16 MED ORDER — VITAMIN B-12 1000 MCG PO TABS
1000.0000 ug | ORAL_TABLET | Freq: Every day | ORAL | Status: DC
  Administered 2020-09-16: 1000 ug via ORAL
  Filled 2020-09-16: qty 1

## 2020-09-16 MED ORDER — LEVOTHYROXINE SODIUM 100 MCG PO TABS
100.0000 ug | ORAL_TABLET | Freq: Every day | ORAL | Status: DC
  Administered 2020-09-17: 100 ug via ORAL
  Filled 2020-09-16: qty 1

## 2020-09-16 MED ORDER — LIDOCAINE 2% (20 MG/ML) 5 ML SYRINGE
INTRAMUSCULAR | Status: DC | PRN
  Administered 2020-09-16: 80 mg via INTRAVENOUS

## 2020-09-16 MED ORDER — LIDOCAINE-EPINEPHRINE 1 %-1:100000 IJ SOLN
INTRAMUSCULAR | Status: DC | PRN
  Administered 2020-09-16: 10 mL

## 2020-09-16 MED ORDER — FENTANYL CITRATE (PF) 100 MCG/2ML IJ SOLN: 25.0000 ug | INTRAMUSCULAR | Status: DC | PRN

## 2020-09-16 MED ORDER — 0.9 % SODIUM CHLORIDE (POUR BTL) OPTIME
TOPICAL | Status: DC | PRN
  Administered 2020-09-16: 1000 mL

## 2020-09-16 MED ORDER — LOSARTAN POTASSIUM 50 MG PO TABS
100.0000 mg | ORAL_TABLET | Freq: Every day | ORAL | Status: DC
  Administered 2020-09-16: 100 mg via ORAL
  Filled 2020-09-16: qty 2

## 2020-09-16 MED ORDER — OXYCODONE HCL 5 MG PO TABS
10.0000 mg | ORAL_TABLET | ORAL | Status: DC | PRN
  Administered 2020-09-16 (×2): 10 mg via ORAL
  Filled 2020-09-16 (×2): qty 2

## 2020-09-16 MED ORDER — DEXAMETHASONE SODIUM PHOSPHATE 10 MG/ML IJ SOLN
INTRAMUSCULAR | Status: AC
  Filled 2020-09-16: qty 1

## 2020-09-16 MED ORDER — TIZANIDINE HCL 4 MG PO TABS
4.0000 mg | ORAL_TABLET | Freq: Three times a day (TID) | ORAL | Status: DC | PRN
  Administered 2020-09-16: 4 mg via ORAL
  Filled 2020-09-16: qty 1

## 2020-09-16 MED ORDER — ONDANSETRON HCL 4 MG/2ML IJ SOLN
INTRAMUSCULAR | Status: DC | PRN
  Administered 2020-09-16: 4 mg via INTRAVENOUS

## 2020-09-16 MED ORDER — ONDANSETRON HCL 4 MG PO TABS: 4.0000 mg | ORAL_TABLET | Freq: Four times a day (QID) | ORAL | Status: DC | PRN

## 2020-09-16 MED ORDER — ORAL CARE MOUTH RINSE: 15.0000 mL | Freq: Once | OROMUCOSAL | Status: AC

## 2020-09-16 MED ORDER — EPHEDRINE SULFATE-NACL 50-0.9 MG/10ML-% IV SOSY
PREFILLED_SYRINGE | INTRAVENOUS | Status: DC | PRN
  Administered 2020-09-16: 5 mg via INTRAVENOUS

## 2020-09-16 MED ORDER — CEFAZOLIN SODIUM-DEXTROSE 2-4 GM/100ML-% IV SOLN
2.0000 g | Freq: Three times a day (TID) | INTRAVENOUS | Status: AC
  Administered 2020-09-16 (×2): 2 g via INTRAVENOUS
  Filled 2020-09-16 (×2): qty 100

## 2020-09-16 MED ORDER — FLUOXETINE HCL 20 MG PO CAPS
40.0000 mg | ORAL_CAPSULE | Freq: Every day | ORAL | Status: DC
  Filled 2020-09-16: qty 2

## 2020-09-16 MED ORDER — MENTHOL 3 MG MT LOZG: 1.0000 | LOZENGE | OROMUCOSAL | Status: DC | PRN

## 2020-09-16 MED ORDER — PROGESTERONE MICRONIZED 100 MG PO CAPS
100.0000 mg | ORAL_CAPSULE | Freq: Every day | ORAL | Status: DC
  Filled 2020-09-16: qty 1

## 2020-09-16 MED ORDER — LIDOCAINE-EPINEPHRINE 1 %-1:100000 IJ SOLN
INTRAMUSCULAR | Status: AC
  Filled 2020-09-16: qty 1

## 2020-09-16 MED ORDER — FENTANYL CITRATE (PF) 250 MCG/5ML IJ SOLN
INTRAMUSCULAR | Status: AC
  Filled 2020-09-16: qty 5

## 2020-09-16 MED ORDER — ROCURONIUM BROMIDE 10 MG/ML (PF) SYRINGE
PREFILLED_SYRINGE | INTRAVENOUS | Status: AC
  Filled 2020-09-16: qty 10

## 2020-09-16 MED ORDER — THROMBIN 5000 UNITS EX SOLR
CUTANEOUS | Status: AC
  Filled 2020-09-16: qty 5000

## 2020-09-16 MED ORDER — CHLORHEXIDINE GLUCONATE 0.12 % MT SOLN
15.0000 mL | Freq: Once | OROMUCOSAL | Status: AC
Start: 2020-09-16 — End: 2020-09-16
  Administered 2020-09-16: 15 mL via OROMUCOSAL
  Filled 2020-09-16: qty 15

## 2020-09-16 MED ORDER — FENTANYL CITRATE (PF) 250 MCG/5ML IJ SOLN
INTRAMUSCULAR | Status: DC | PRN
  Administered 2020-09-16 (×2): 50 ug via INTRAVENOUS

## 2020-09-16 SURGICAL SUPPLY — 47 items
BAG COUNTER SPONGE SURGICOUNT (BAG) ×2 IMPLANT
BAND RUBBER #18 3X1/16 STRL (MISCELLANEOUS) ×4 IMPLANT
BLADE CLIPPER SURG (BLADE) ×2 IMPLANT
BLADE SURG 11 STRL SS (BLADE) ×2 IMPLANT
BUR PRECISION FLUTE 5.0 (BURR) IMPLANT
BUR PRECISION MATCH 3.0 13 (BURR) IMPLANT
CANISTER SUCT 3000ML PPV (MISCELLANEOUS) ×2 IMPLANT
DECANTER SPIKE VIAL GLASS SM (MISCELLANEOUS) IMPLANT
DERMABOND ADVANCED (GAUZE/BANDAGES/DRESSINGS) ×1
DERMABOND ADVANCED .7 DNX12 (GAUZE/BANDAGES/DRESSINGS) ×1 IMPLANT
DRAPE C-ARM 42X72 X-RAY (DRAPES) ×4 IMPLANT
DRAPE LAPAROTOMY 100X72X124 (DRAPES) ×2 IMPLANT
DRAPE MICROSCOPE LEICA (MISCELLANEOUS) ×2 IMPLANT
DRAPE SURG 17X23 STRL (DRAPES) ×2 IMPLANT
DURAPREP 26ML APPLICATOR (WOUND CARE) ×2 IMPLANT
ELECT BLADE 6.5 EXT (BLADE) ×2 IMPLANT
ELECT REM PT RETURN 9FT ADLT (ELECTROSURGICAL) ×2
ELECTRODE REM PT RTRN 9FT ADLT (ELECTROSURGICAL) ×1 IMPLANT
GAUZE 4X4 16PLY ~~LOC~~+RFID DBL (SPONGE) ×2 IMPLANT
GAUZE SPONGE 4X4 12PLY STRL (GAUZE/BANDAGES/DRESSINGS) IMPLANT
GLOVE EXAM NITRILE LRG STRL (GLOVE) IMPLANT
GLOVE EXAM NITRILE XL STR (GLOVE) IMPLANT
GLOVE EXAM NITRILE XS STR PU (GLOVE) IMPLANT
GLOVE SURG LTX SZ7.5 (GLOVE) ×2 IMPLANT
GLOVE SURG UNDER POLY LF SZ7 (GLOVE) ×6 IMPLANT
GLOVE SURG UNDER POLY LF SZ7.5 (GLOVE) ×2 IMPLANT
GOWN STRL REUS W/ TWL LRG LVL3 (GOWN DISPOSABLE) ×1 IMPLANT
GOWN STRL REUS W/ TWL XL LVL3 (GOWN DISPOSABLE) ×2 IMPLANT
GOWN STRL REUS W/TWL 2XL LVL3 (GOWN DISPOSABLE) IMPLANT
GOWN STRL REUS W/TWL LRG LVL3 (GOWN DISPOSABLE) ×1
GOWN STRL REUS W/TWL XL LVL3 (GOWN DISPOSABLE) ×4
HEMOSTAT POWDER KIT SURGIFOAM (HEMOSTASIS) ×2 IMPLANT
KIT BASIN OR (CUSTOM PROCEDURE TRAY) ×2 IMPLANT
KIT TURNOVER KIT B (KITS) ×2 IMPLANT
NEEDLE HYPO 22GX1.5 SAFETY (NEEDLE) ×2 IMPLANT
NEEDLE SPNL 18GX3.5 QUINCKE PK (NEEDLE) ×2 IMPLANT
NS IRRIG 1000ML POUR BTL (IV SOLUTION) ×2 IMPLANT
PACK LAMINECTOMY NEURO (CUSTOM PROCEDURE TRAY) ×2 IMPLANT
PAD ARMBOARD 7.5X6 YLW CONV (MISCELLANEOUS) ×6 IMPLANT
SPONGE T-LAP 4X18 ~~LOC~~+RFID (SPONGE) ×2 IMPLANT
SUT MNCRL AB 3-0 PS2 18 (SUTURE) ×2 IMPLANT
SUT VIC AB 0 CT1 18XCR BRD8 (SUTURE) IMPLANT
SUT VIC AB 0 CT1 8-18 (SUTURE)
SUT VIC AB 2-0 CP2 18 (SUTURE) ×2 IMPLANT
TOWEL GREEN STERILE (TOWEL DISPOSABLE) ×2 IMPLANT
TOWEL GREEN STERILE FF (TOWEL DISPOSABLE) ×2 IMPLANT
WATER STERILE IRR 1000ML POUR (IV SOLUTION) ×2 IMPLANT

## 2020-09-16 NOTE — Anesthesia Procedure Notes (Signed)
Procedure Name: Intubation Date/Time: 09/16/2020 7:38 AM Performed by: Fulton Reek, CRNA Pre-anesthesia Checklist: Patient identified, Emergency Drugs available, Suction available and Patient being monitored Patient Re-evaluated:Patient Re-evaluated prior to induction Oxygen Delivery Method: Circle System Utilized Preoxygenation: Pre-oxygenation with 100% oxygen Induction Type: IV induction Ventilation: Mask ventilation without difficulty Laryngoscope Size: Mac and 3 Grade View: Grade I Tube type: Oral Tube size: 7.0 mm Number of attempts: 1 Airway Equipment and Method: Stylet Placement Confirmation: ETT inserted through vocal cords under direct vision, positive ETCO2 and breath sounds checked- equal and bilateral Secured at: 21 cm Tube secured with: Tape Dental Injury: Teeth and Oropharynx as per pre-operative assessment

## 2020-09-16 NOTE — Op Note (Signed)
PATIENT: Gabrielle Davidson  DAY OF SURGERY: 09/16/20   PRE-OPERATIVE DIAGNOSIS:  Herniated nucleus pulposus with lumbar radiculopathy   POST-OPERATIVE DIAGNOSIS:  Herniated nucleus pulposus with lumbar radiculopathy   PROCEDURE:  Right minimally invasive L5-S1 discectomy   SURGEON:  Surgeon(s) and Role:    Judith Part, MD - Primary   ANESTHESIA: ETGA   BRIEF HISTORY: This is a 70 year old woman who presented with right lower extremity pain, numbness, and weakness. An MRI showed a corresponding disc herniation and and nerve root compression. I therefore recommended a minimally invasive L5-S1 discectomy. This was discussed with the patient as well as risks, benefits, and alternatives and the patient wished to proceed with surgical treatment.    OPERATIVE DETAIL: The patient was taken to the operating room and placed on the OR table in the prone position. A formal time out was performed with two patient identifiers and confirmed the operative site. Anesthesia was induced by the anesthesia team. The operative site was marked, hair was clipped with surgical clippers, the area was then prepped and draped in a sterile fashion. Fluoroscopy was used to identify the surgical level prior to incision.   A 2cm incision was then marked 1cm off to the right of midline. The fascia was incised sharply and serial dilators were docked to the lamino-facet junction using fluoroscopy to confirm position as well as perform a second count to confirm the correct surgical level. After a final dilator was placed, a tubular retractor was placed over this and secured to the table. The operating microscope was draped and brought into the field. Anatomy was palpated and confirmed, monopolar cautery was used to expose the facet, lamina, and a portion of the spinous process to confirm orientation. A high speed drill and kerrison rongeurs were then used to create a hemilaminotomy and medial facetectomy. The ligamentum  flavum was resected and the thecal sac and traversing nerve root were identified. The traversing nerve root was gently retracted medially to expose the disc space using a suction-retractor.   There was an obvious disc herniation in the lateral recess. An annulotomy was created sharply and large, free disc fragments that were easily removed. The annulotomy was explored and additional free fragments were removed. I was able to get a nerve hook into the foramen and push fragments encroaching on the exiting root down into the disc space where they were removed. The foramen was palpated and felt to have much better space after discectomy. I therefore obtained and confirmed hemostasis.  The wound was copiously irrigated, and the tube was removed while using the microscope to confirm hemostasis of the muscle edges. All instrument and sponge counts were correct and the incision was then closed in layers. The patient was then returned to anesthesia for emergence. No apparent complications at the completion of the procedure.    EBL:  45mL   DRAINS: none   SPECIMENS: none   Judith Part, MD 09/16/20 7:29 AM

## 2020-09-16 NOTE — Progress Notes (Signed)
Neurosurgery Service Post-operative progress note  Assessment & Plan: 70 y.o. woman s/p R L5-S1 MIS discectomy, seen in PACU, preop pain resolved, numbness improved, weakness improved.  -obs overnight, can go home this afternoon or tomorrow morning if she feels up to it   [employer]  09/16/20 9:54 AM

## 2020-09-16 NOTE — Brief Op Note (Signed)
09/16/2020  9:11 AM  PATIENT:  Kelleigh R Freitas  70 y.o. female  PRE-OPERATIVE DIAGNOSIS:  Lumbar radiculopathy  POST-OPERATIVE DIAGNOSIS:  Lumbar radiculopathy  PROCEDURE:  Procedure(s): Right Lumbar five Sacral one Minimally  invasive Discectomy with Metrex (Right)  SURGEON:  Surgeon(s) and Role:    * Judith Part, MD - Primary  PHYSICIAN ASSISTANT:   ASSISTANTS: none   ANESTHESIA:   general  EBL:  25 mL   BLOOD ADMINISTERED:none  DRAINS: none   LOCAL MEDICATIONS USED:  LIDOCAINE   SPECIMEN:  No Specimen  DISPOSITION OF SPECIMEN:  N/A  COUNTS:  YES  TOURNIQUET:  * No tourniquets in log *  DICTATION: .Note written in EPIC  PLAN OF CARE: Admit for overnight observation  PATIENT DISPOSITION:  PACU - hemodynamically stable.   Delay start of Pharmacological VTE agent (>24hrs) due to surgical blood loss or risk of bleeding: yes

## 2020-09-16 NOTE — Anesthesia Postprocedure Evaluation (Signed)
Anesthesia Post Note  Patient: Gabrielle Davidson  Procedure(s) Performed: Right Lumbar five Sacral one Minimally  invasive Discectomy with Metrex (Right: Spine Lumbar)     Patient location during evaluation: PACU Anesthesia Type: General Level of consciousness: awake and alert and oriented Pain management: pain level controlled Vital Signs Assessment: post-procedure vital signs reviewed and stable Respiratory status: spontaneous breathing, nonlabored ventilation and respiratory function stable Cardiovascular status: blood pressure returned to baseline Postop Assessment: no apparent nausea or vomiting Anesthetic complications: no   No notable events documented.  Last Vitals:  Vitals:   09/16/20 0945 09/16/20 1021  BP: 136/73 (!) 160/71  Pulse: 66 64  Resp: 17 20  Temp: (!) 36.1 C 36.5 C  SpO2: 95% 100%    Last Pain:  Vitals:   09/16/20 1021  TempSrc: Oral  PainSc:                  Brennan Bailey

## 2020-09-16 NOTE — Transfer of Care (Signed)
Immediate Anesthesia Transfer of Care Note  Patient: Yasaman R Rule  Procedure(s) Performed: Right Lumbar five Sacral one Minimally  invasive Discectomy with Metrex (Right: Spine Lumbar)  Patient Location: PACU  Anesthesia Type:General  Level of Consciousness: awake, alert  and oriented  Airway & Oxygen Therapy: Patient Spontanous Breathing and Patient connected to face mask oxygen  Post-op Assessment: Report given to RN and Post -op Vital signs reviewed and stable  Post vital signs: Reviewed and stable  Last Vitals:  Vitals Value Taken Time  BP    Temp    Pulse 73 09/16/20 0916  Resp 15 09/16/20 0916  SpO2 100 % 09/16/20 0916  Vitals shown include unvalidated device data.  Last Pain:  Vitals:   09/16/20 0613  TempSrc:   PainSc: 3       Patients Stated Pain Goal: 3 (54/65/68 1275)  Complications: No notable events documented.

## 2020-09-16 NOTE — H&P (Signed)
Surgical H&P Update  HPI: 70 y.o. woman with right lower extremity radicular pain that was unfortunately not controlled by multiple non-surgical treatment measures then developed right foot weakness in TA & EHL. MRI showed a right L5-S1 foraminal disc herniation, here for a right L5-S1 MIS discectomy. No changes in health since she was last seen. Still having pain and weakness and wishes to proceed with surgery.  PMHx:  Past Medical History:  Diagnosis Date   Cancer ([place])    GERD (gastroesophageal reflux disease)    Hepatitis    A-YEARS AGO   Hypertension    Hypothyroidism    FamHx: History reviewed. No pertinent family history. SocHx:  reports that she quit smoking about 37 years ago. Her smoking use included cigarettes. She has a 20.00 pack-year smoking history. She has never used smokeless tobacco. She reports that she does not drink alcohol and does not use drugs.  Physical Exam:  Strength 5/5 x4 except 4/5 R TA/EHL 4/5 and R L5 distribution numbness  Assesment/Plan: 70 y.o. woman with lumbar radic 2/2 R L5-S1 foraminal disc, here for R L5-S1 MIS discectomy. Risks, benefits, and alternatives discussed and the patient would like to continue with surgery.  -OR today -3C post-op  Judith Part, MD 09/16/20 7:11 AM

## 2020-09-17 ENCOUNTER — Encounter (HOSPITAL_COMMUNITY): Payer: Self-pay | Admitting: Neurological Surgery

## 2020-09-17 DIAGNOSIS — M5116 Intervertebral disc disorders with radiculopathy, lumbar region: Secondary | ICD-10-CM | POA: Diagnosis not present

## 2020-09-17 MED ORDER — TIZANIDINE HCL 4 MG PO TABS
4.0000 mg | ORAL_TABLET | Freq: Three times a day (TID) | ORAL | 0 refills | Status: AC | PRN
Start: 2020-09-17 — End: ?

## 2020-09-17 MED ORDER — OXYCODONE-ACETAMINOPHEN 5-325 MG PO TABS: 1.0000 | ORAL_TABLET | Freq: Four times a day (QID) | ORAL | 0 refills | Status: DC | PRN

## 2020-09-17 MED ORDER — ASPIRIN EC 81 MG PO TBEC: 81.0000 mg | DELAYED_RELEASE_TABLET | Freq: Every day | ORAL | 11 refills | Status: DC

## 2020-09-17 NOTE — Progress Notes (Signed)
Neurosurgery Service Progress Note  Subjective: No acute events overnight, preop leg pain  resolved, fulls trength / sensation  Objective: Vitals:   09/16/20 1937 09/16/20 2259 09/17/20 0520 09/17/20 0803  BP: 121/69 (!) 110/58 117/65 106/66  Pulse: 67 (!) 58 69 69  Resp: 18 18 18 16   Temp: 98.1 F (36.7 C) 97.9 F (36.6 C) 97.8 F (36.6 C) 98.1 F (36.7 C)  TempSrc: Oral Oral Oral Oral  SpO2: 94% 93% 100% 94%  Weight:      Height:        Physical Exam: Strength 5/5 x4, SILTx4 Incision c/d/i  Assessment & Plan: 70 y.o. woman s/p MIS discectomy, recovering well.  -discharge home today  Judith Part  09/17/20 8:43 AM

## 2020-09-17 NOTE — Evaluation (Signed)
Physical Therapy Evaluation Patient Details Name: Gabrielle Davidson MRN: [id number] DOB: [birth year] Today's Date: 09/17/2020   History of Present Illness  Pt is a 70 y/o female s/p L5-S1 discectomy for R LE radicular pain. PMH including but not limited to HTN, hypothyroidism and cancer.   Clinical Impression  Pt presented supine in bed with HOB elevated, awake and willing to participate in therapy session. Prior to admission, pt reported that she was independent with all functional mobility and ADLs. Pt lives with her husband in a two level home (laundry in the basement) with a few steps to enter. At the time of evaluation, pt overall moving very well at a mod I to supervision level with all functional mobility including hallway ambulation without use of an AD. Pt also participated in stair training this session without difficulties. PT provided pt with a back precautions handout and reviewed 3/3 precautions throughout the session. No further acute PT needs identified at this time. PT signing off.     Follow Up Recommendations No PT follow up    Equipment Recommendations  None recommended by PT    Recommendations for Other Services       Precautions / Restrictions Precautions Precautions: Back Precaution Booklet Issued: Yes (comment) Precaution Comments: reviewed 3/3 back precautions with pt throughout Restrictions Weight Bearing Restrictions: No      Mobility  Bed Mobility Overal bed mobility: Modified Independent             General bed mobility comments: cueing for log roll    Transfers Overall transfer level: Needs assistance Equipment used: None Transfers: Sit to/from Stand Sit to Stand: Supervision         General transfer comment: for safety  Ambulation/Gait Ambulation/Gait assistance: Supervision Gait Distance (Feet): 500 Feet Assistive device: None Gait Pattern/deviations: Step-through pattern Gait velocity: mildly decreased   General Gait Details:  no instability or difficulties  Stairs Stairs: Yes Stairs assistance: Supervision Stair Management: One rail Right;Alternating pattern;Forwards Number of Stairs: 10 General stair comments: no instability or LOB  Wheelchair Mobility    Modified Rankin (Stroke Patients Only)       Balance Overall balance assessment: Needs assistance Sitting-balance support: Feet supported Sitting balance-Leahy Scale: Good     Standing balance support: During functional activity;No upper extremity supported Standing balance-Leahy Scale: Good                               Pertinent Vitals/Pain Pain Assessment: No/denies pain Pain Score: 2  Pain Location: Incision Pain Descriptors / Indicators: Aching Pain Intervention(s): Monitored during session    Home Living Family/patient expects to be discharged to:: Private residence Living Arrangements: Spouse/significant other Available Help at Discharge: Family;Available 24 hours/day Type of Home: House Home Access: Stairs to enter Entrance Stairs-Rails: Psychiatric nurse of Steps: 3 Home Layout: Laundry or work area in [place]: Civil engineer, contracting - built in      Prior Function Level of Independence: Independent         Comments: works in pharmacy PRN     [place]: Right    Extremity/Trunk Assessment   Upper Extremity Assessment Upper Extremity Assessment: Defer to OT evaluation;Overall WFL for tasks assessed    Lower Extremity Assessment Lower Extremity Assessment: Overall WFL for tasks assessed    Cervical / Trunk Assessment Cervical / Trunk Assessment: Other exceptions Cervical / Trunk Exceptions: s/p back sx  Communication  Communication: No difficulties  Cognition Arousal/Alertness: Awake/alert Behavior During Therapy: WFL for tasks assessed/performed Overall Cognitive Status: Within Functional Limits for tasks assessed                                         General Comments      Exercises     Assessment/Plan    PT Assessment Patent does not need any further PT services  PT Problem List         PT Treatment Interventions      PT Goals (Current goals can be found in the Care Plan section)  Acute Rehab PT Goals Patient Stated Goal: "home today" PT Goal Formulation: All assessment and education complete, DC therapy    Frequency     Barriers to discharge        Co-evaluation               AM-PAC PT "6 Clicks" Mobility  Outcome Measure Help needed turning from your back to your side while in a flat bed without using bedrails?: None Help needed moving from lying on your back to sitting on the side of a flat bed without using bedrails?: None Help needed moving to and from a bed to a chair (including a wheelchair)?: None Help needed standing up from a chair using your arms (e.g., wheelchair or bedside chair)?: None Help needed to walk in hospital room?: None Help needed climbing 3-5 steps with a railing? : None 6 Click Score: 24    End of Session   Activity Tolerance: Patient tolerated treatment well Patient left: in chair;with call bell/phone within reach Nurse Communication: Mobility status PT Visit Diagnosis: Other abnormalities of gait and mobility (R26.89)    Time: 2800-3491 PT Time Calculation (min) (ACUTE ONLY): 14 min   Charges:   PT Evaluation $PT Eval Low Complexity: 1 Low          Eduard Clos, PT, DPT  Acute Rehabilitation Services Office [place] 09/17/2020, 9:39 AM

## 2020-09-17 NOTE — Discharge Instructions (Signed)
Discharge Instructions ° °No restriction in activities, slowly increase your activity back to normal.  ° °Your incision is closed with dermabond (purple glue). This will naturally fall off over the next 1-2 weeks.  ° °Okay to shower on the day of discharge. Use regular soap and water and try to be gentle when cleaning your incision.  ° °Follow up with Dr. Eugina Row in 2 weeks after discharge. If you do not already have a discharge appointment, please call his office at [phone] to schedule a follow up appointment. If you have any concerns or questions, please call the office and let us know. °

## 2020-09-17 NOTE — Discharge Summary (Signed)
Discharge Summary  Date of Admission: 09/16/2020  Date of Discharge: 09/17/20  Attending Physician: Emelda Brothers, MD  Hospital Course: Patient was admitted following an uncomplicated MIS D2-K0 R LMD. She was recovered in PACU and transferred to [facility]. Her preop pain was resolved immediately post-op and her numbness/weakness was also impressively resolved. Her hospital course was uncomplicated and the patient was discharged home on 09/17/20. She will follow up in clinic with me in 2 weeks.  Neurologic exam at discharge:  Strength 5/5 x4, SILTx4  Discharge diagnosis: Lumbar radiculopathy  Judith Part, MD 09/17/20 8:49 AM

## 2020-09-17 NOTE — Progress Notes (Signed)
Patient is discharged from room 3C08 at this time. Alert and in stable condition. IV site d/c'd and instructions read to patient with understanding verbalized and all questions answered. Left unit via wheelchair with all belongings at side. 

## 2020-09-17 NOTE — Evaluation (Signed)
Occupational Therapy Evaluation Patient Details Name: Gabrielle Davidson MRN: [id number] DOB: [birth year] Today's Date: 09/17/2020    History of Present Illness 70 y.o. woman with right lower extremity radicular pain.  PMH includes: Cancer, GERD, Hepatitis, Hypertension, Hypothyroidism.  S/p Right Lumbar five Sacral one Minimally  invasive Discectomy.   Clinical Impression   Patient admitted for the diagnosis above.  PTA she lives with her spouse, who can assist as needed.  She is essentially at, or near, her baseline for in room mobility, and ADL completion.  No further OT needs identified in the acute setting.  All questions answered and precautions reviewed.      Follow Up Recommendations  No OT follow up    Equipment Recommendations  Other (comment) ([place])    Recommendations for Other Services       Precautions / Restrictions Precautions Precautions: Back Precaution Booklet Issued: Yes (comment) Restrictions Weight Bearing Restrictions: No      Mobility Bed Mobility Overal bed mobility: Modified Independent               Patient Response: Cooperative  Transfers Overall transfer level: Independent                    Balance Overall balance assessment: No apparent balance deficits (not formally assessed)                                         ADL either performed or assessed with clinical judgement   ADL Overall ADL's : At baseline                                             Vision Patient Visual Report: No change from baseline       Perception     Praxis      Pertinent Vitals/Pain Pain Assessment: 0-10 Pain Score: 2  Pain Location: Incision Pain Descriptors / Indicators: Aching Pain Intervention(s): Monitored during session     Hand Dominance Right   Extremity/Trunk Assessment Upper Extremity Assessment Upper Extremity Assessment: Overall WFL for tasks assessed   Lower Extremity  Assessment Lower Extremity Assessment: Defer to PT evaluation   Cervical / Trunk Assessment Cervical / Trunk Assessment: Other exceptions Cervical / Trunk Exceptions: back surgery   Communication Communication Communication: No difficulties   Cognition Arousal/Alertness: Awake/alert Behavior During Therapy: WFL for tasks assessed/performed Overall Cognitive Status: Within Functional Limits for tasks assessed                                                      Home Living Family/patient expects to be discharged to:: Private residence Living Arrangements: Spouse/significant other Available Help at Discharge: Family;Available 24 hours/day Type of Home: House             Bathroom Shower/Tub: Walk-in Psychologist, prison and probation services: Standard     Home Equipment: Shower seat          Prior Functioning/Environment Level of Independence: Independent                 OT Problem List: Pain  OT Treatment/Interventions:      OT Goals(Current goals can be found in the care plan section) Acute Rehab OT Goals Patient Stated Goal: Return home and start walking again OT Goal Formulation: With patient Time For Goal Achievement: 09/17/20 Potential to Achieve Goals: Good  OT Frequency:     Barriers to D/C:            Co-evaluation              AM-PAC OT "6 Clicks" Daily Activity     Outcome Measure Help from another person eating meals?: None Help from another person taking care of personal grooming?: None Help from another person toileting, which includes using toliet, bedpan, or urinal?: None Help from another person bathing (including washing, rinsing, drying)?: None Help from another person to put on and taking off regular upper body clothing?: None Help from another person to put on and taking off regular lower body clothing?: None 6 Click Score: 24   End of Session    Activity Tolerance: Patient tolerated treatment well Patient  left: in chair;with call bell/phone within reach  OT Visit Diagnosis: Pain Pain - Right/Left:  (back)                Time: 5391-2258 OT Time Calculation (min): 16 min Charges:  OT General Charges $OT Visit: 1 Visit OT Evaluation $OT Eval Moderate Complexity: 1 Mod  09/17/2020  Rich, OTR/L  Acute Rehabilitation Services  Office:  [phone]   [name] 09/17/2020, 8:46 AM

## 2020-12-19 ENCOUNTER — Other Ambulatory Visit: Payer: Self-pay | Admitting: Internal Medicine

## 2020-12-19 DIAGNOSIS — Z1231 Encounter for screening mammogram for malignant neoplasm of breast: Secondary | ICD-10-CM

## 2021-01-02 ENCOUNTER — Other Ambulatory Visit: Payer: Self-pay

## 2021-01-02 ENCOUNTER — Ambulatory Visit
Admission: RE | Admit: 2021-01-02 | Discharge: 2021-01-02 | Disposition: A | Discharge status: Home / Self Care | Payer: Medicare HMO | Source: Ambulatory Visit | Attending: Internal Medicine | Admitting: Internal Medicine

## 2021-01-02 DIAGNOSIS — Z1231 Encounter for screening mammogram for malignant neoplasm of breast: Secondary | ICD-10-CM | POA: Insufficient documentation

## 2021-01-08 ENCOUNTER — Other Ambulatory Visit: Payer: Self-pay | Admitting: Internal Medicine

## 2021-01-08 DIAGNOSIS — N6310 Unspecified lump in the right breast, unspecified quadrant: Secondary | ICD-10-CM

## 2021-01-08 DIAGNOSIS — R928 Other abnormal and inconclusive findings on diagnostic imaging of breast: Secondary | ICD-10-CM

## 2021-01-28 ENCOUNTER — Other Ambulatory Visit: Payer: Self-pay

## 2021-01-28 ENCOUNTER — Ambulatory Visit
Admission: RE | Admit: 2021-01-28 | Discharge: 2021-01-28 | Disposition: A | Discharge status: Home / Self Care | Payer: Medicare HMO | Source: Ambulatory Visit | Attending: Internal Medicine | Admitting: Internal Medicine

## 2021-01-28 DIAGNOSIS — N6310 Unspecified lump in the right breast, unspecified quadrant: Secondary | ICD-10-CM | POA: Insufficient documentation

## 2021-01-28 DIAGNOSIS — R928 Other abnormal and inconclusive findings on diagnostic imaging of breast: Secondary | ICD-10-CM | POA: Diagnosis not present

## 2021-02-02 ENCOUNTER — Other Ambulatory Visit: Payer: Self-pay | Admitting: Internal Medicine

## 2021-02-02 DIAGNOSIS — N6310 Unspecified lump in the right breast, unspecified quadrant: Secondary | ICD-10-CM

## 2021-02-02 DIAGNOSIS — R928 Other abnormal and inconclusive findings on diagnostic imaging of breast: Secondary | ICD-10-CM

## 2021-02-10 ENCOUNTER — Other Ambulatory Visit: Payer: Self-pay | Admitting: Internal Medicine

## 2021-02-10 ENCOUNTER — Ambulatory Visit
Admission: RE | Admit: 2021-02-10 | Discharge: 2021-02-10 | Disposition: A | Discharge status: Home / Self Care | Payer: Medicare HMO | Source: Ambulatory Visit | Attending: Internal Medicine | Admitting: Internal Medicine

## 2021-02-10 ENCOUNTER — Other Ambulatory Visit: Payer: Self-pay

## 2021-02-10 DIAGNOSIS — R928 Other abnormal and inconclusive findings on diagnostic imaging of breast: Secondary | ICD-10-CM

## 2021-02-10 DIAGNOSIS — N6310 Unspecified lump in the right breast, unspecified quadrant: Secondary | ICD-10-CM

## 2021-02-10 HISTORY — PX: BREAST BIOPSY: SHX20

## 2021-02-11 ENCOUNTER — Other Ambulatory Visit: Payer: Self-pay | Admitting: Internal Medicine

## 2021-02-11 DIAGNOSIS — R928 Other abnormal and inconclusive findings on diagnostic imaging of breast: Secondary | ICD-10-CM

## 2021-02-17 DIAGNOSIS — C50919 Malignant neoplasm of unspecified site of unspecified female breast: Secondary | ICD-10-CM

## 2021-02-18 ENCOUNTER — Other Ambulatory Visit: Payer: Self-pay

## 2021-02-18 ENCOUNTER — Ambulatory Visit
Admission: RE | Admit: 2021-02-18 | Discharge: 2021-02-18 | Disposition: A | Discharge status: Home / Self Care | Payer: Medicare HMO | Source: Ambulatory Visit | Attending: Internal Medicine | Admitting: Internal Medicine

## 2021-02-18 ENCOUNTER — Other Ambulatory Visit: Payer: Self-pay | Admitting: General Surgery

## 2021-02-18 DIAGNOSIS — C50411 Malignant neoplasm of upper-outer quadrant of right female breast: Secondary | ICD-10-CM

## 2021-02-18 DIAGNOSIS — R928 Other abnormal and inconclusive findings on diagnostic imaging of breast: Secondary | ICD-10-CM | POA: Diagnosis present

## 2021-02-18 HISTORY — PX: BREAST BIOPSY: SHX20

## 2021-02-19 LAB — SURGICAL PATHOLOGY

## 2021-02-23 ENCOUNTER — Encounter: Payer: Self-pay | Admitting: *Deleted

## 2021-02-23 NOTE — Progress Notes (Signed)
Navigation initiated.  Patient scheduled with Dr. Bary Castilla, and Dr. Tasia Catchings.

## 2021-02-24 ENCOUNTER — Ambulatory Visit: Payer: Medicare HMO | Admitting: Plastic Surgery

## 2021-02-24 ENCOUNTER — Other Ambulatory Visit: Payer: Self-pay

## 2021-02-24 ENCOUNTER — Encounter: Payer: Self-pay | Admitting: Plastic Surgery

## 2021-02-24 VITALS — BP 133/79 | HR 66 | Ht 59.0 in | Wt 144.0 lb

## 2021-02-24 DIAGNOSIS — C73 Malignant neoplasm of thyroid gland: Secondary | ICD-10-CM | POA: Diagnosis not present

## 2021-02-24 DIAGNOSIS — C50411 Malignant neoplasm of upper-outer quadrant of right female breast: Secondary | ICD-10-CM | POA: Diagnosis not present

## 2021-02-24 DIAGNOSIS — M5416 Radiculopathy, lumbar region: Secondary | ICD-10-CM

## 2021-02-24 DIAGNOSIS — C50919 Malignant neoplasm of unspecified site of unspecified female breast: Secondary | ICD-10-CM | POA: Insufficient documentation

## 2021-02-24 NOTE — Progress Notes (Signed)
Patient ID: Gabrielle Davidson, female    DOB: Jul 24, 1950, 70 y.o.   MRN: [id number]   Chief Complaint  Patient presents with   Breast Cancer    The patient is a 70 yrs old female here for consultation for breast reconstruction.  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  She is interested in breast conservative surgery.  She is 4 feet 11 inches tall and weighs 144 pounds.  She underwent liposuction of her breast 20 years ago by dermatologist in [facility].  She would like to have a partial mastectomy of the right breast and bilateral reductions at the time of the surgery.  She feels her preoperative bra size is a DDD and she would like to be a C cup after surgery.  She is not a smoker and does not have diabetes.  She is not on a blood thinner.  Her last mammogram was in November.  She does have a positive family history of breast cancer.  She has a history of thyroid cancer as well.   Review of Systems  Constitutional: Negative.   HENT: Negative.    Eyes: Negative.   Respiratory: Negative.  Negative for chest tightness.   Cardiovascular: Negative.  Negative for palpitations and leg swelling.  Gastrointestinal: Negative.   Endocrine: Negative.   Genitourinary: Negative.   Musculoskeletal: Negative.   Skin: Negative.   Neurological: Negative.   Hematological: Negative.    Past Medical History:  Diagnosis Date   Breast cancer ([place])    GERD (gastroesophageal reflux disease)    Hepatitis    A-YEARS AGO   Hypercholesteremia    Hypertension    Hypothyroidism    Osteopenia of multiple sites    Statin-induced myositis    Thyroid cancer [facility])     Past Surgical History:  Procedure Laterality Date   BREAST BIOPSY Right 02/10/2021   [place] bx, heart marker, path pending   BREAST BIOPSY Right 02/18/2021   stereo bx, distortion "COIL" clip-path pending   LUMBAR LAMINECTOMY/ DECOMPRESSION WITH MET-RX Right 09/16/2020   Procedure:  Right Lumbar five Sacral one Minimally  invasive Discectomy with Metrex;  Surgeon: Judith Part, MD;  Location: [place];  Service: Neurosurgery;  Laterality: Right;   NO PAST SURGERIES     THYROIDECTOMY Bilateral 03/26/2015   Procedure: THYROIDECTOMY;  Surgeon: Clyde Canterbury, MD;  Location: [facility];  Service: ENT;  Laterality: Bilateral;      Current Outpatient Medications:    Ascorbic Acid (VITAMIN C) 1000 MG tablet, Take 1,000 mg by mouth in the morning and at bedtime., Disp: , Rfl:    aspirin EC 81 MG tablet, Take 1 tablet (81 mg total) by mouth at bedtime. Restart 09/23/20, Disp: 30 tablet, Rfl: 11   Calcium Carbonate-Vitamin D3 600-400 MG-UNIT TABS, Take 1-2 tablets by mouth See admin instructions. 1 tablet in the morning and 2 tablets at bedtime, Disp: , Rfl:    chlorthalidone (HYGROTON) 25 MG tablet, Take 25 mg by mouth daily., Disp: , Rfl:    FLUoxetine (PROZAC) 40 MG capsule, Take 40 mg by mouth every morning. , Disp: , Rfl:    levothyroxine (SYNTHROID) 100 MCG tablet, Take 100 mcg by mouth daily before breakfast., Disp: , Rfl:    losartan (COZAAR) 100 MG tablet, Take 100 mg by mouth daily., Disp: , Rfl:    Multiple Vitamin (MULTI-VITAMIN) tablet, Take by mouth., Disp: , Rfl:    tiZANidine (  ZANAFLEX) 4 MG tablet, Take 1 tablet (4 mg total) by mouth every 8 (eight) hours as needed for muscle spasms., Disp: 30 tablet, Rfl: 0   vitamin B-12 (CYANOCOBALAMIN) 1000 MCG tablet, Take 1,000 mcg by mouth daily., Disp: , Rfl:    gabapentin (NEURONTIN) 300 MG capsule, Take 300 mg by mouth 3 (three) times daily., Disp: , Rfl:    meclizine (ANTIVERT) 25 MG tablet, Take 1 tablet (25 mg total) by mouth 3 (three) times daily as needed for dizziness., Disp: 20 tablet, Rfl: 0   Objective:   Vitals:   02/24/21 0827  BP: 133/79  Pulse: 66  SpO2: 95%    Physical Exam Vitals and nursing note reviewed.  Constitutional:      Appearance: Normal appearance.  HENT:     Head: Normocephalic and  atraumatic.  Cardiovascular:     Rate and Rhythm: Normal rate.     Pulses: Normal pulses.  Pulmonary:     Effort: Pulmonary effort is normal. No respiratory distress.  Abdominal:     General: There is no distension.     Palpations: Abdomen is soft.     Tenderness: There is no abdominal tenderness.  Musculoskeletal:        General: No swelling or deformity.  Skin:    General: Skin is warm.     Capillary Refill: Capillary refill takes less than 2 seconds.     Coloration: Skin is not jaundiced.     Findings: No bruising.  Neurological:     Mental Status: She is alert and oriented to person, place, and time.  Psychiatric:        Mood and Affect: Mood normal.        Behavior: Behavior normal.        Thought Content: Thought content normal.    Assessment & Plan:  Thyroid cancer ([place])  Malignant neoplasm of upper-outer quadrant of right female breast, unspecified estrogen receptor status ([place])  Lumbar radiculopathy  The patient is planning on treating her breast cancer with right partial mastectomy.  She would like to have a bilateral breast reduction at the time of surgery.  I think this is reasonable.  She is aware that radiation will be offered postoperatively.  She is still deciding on that.  I have discussed the above information with Dr. Bary Castilla.    [place], DO

## 2021-02-25 ENCOUNTER — Encounter: Payer: Self-pay | Admitting: Oncology

## 2021-02-25 LAB — SURGICAL PATHOLOGY

## 2021-02-26 ENCOUNTER — Telehealth: Payer: Self-pay | Admitting: *Deleted

## 2021-02-26 NOTE — Telephone Encounter (Signed)
Spoke with new patient prior to her visit with Dr. Tasia Catchings tomorrow. Patient confirmed that she will come to the apt as scheduled. Oncology services introduced to patient. Patient instructed on directions to the cancer center. She thanked me for calling her with this information.

## 2021-02-27 ENCOUNTER — Other Ambulatory Visit: Payer: Self-pay

## 2021-02-27 ENCOUNTER — Inpatient Hospital Stay: Payer: Medicare HMO

## 2021-02-27 ENCOUNTER — Encounter: Payer: Self-pay | Admitting: Oncology

## 2021-02-27 ENCOUNTER — Inpatient Hospital Stay: Payer: Medicare HMO | Attending: Oncology | Admitting: Oncology

## 2021-02-27 VITALS — BP 128/84 | HR 70 | Temp 95.3°F | Resp 19 | Wt 144.1 lb

## 2021-02-27 DIAGNOSIS — C50211 Malignant neoplasm of upper-inner quadrant of right female breast: Secondary | ICD-10-CM | POA: Diagnosis present

## 2021-02-27 DIAGNOSIS — C50919 Malignant neoplasm of unspecified site of unspecified female breast: Secondary | ICD-10-CM

## 2021-02-27 DIAGNOSIS — Z170 Estrogen receptor positive status [ER+]: Secondary | ICD-10-CM | POA: Diagnosis not present

## 2021-02-27 DIAGNOSIS — Z803 Family history of malignant neoplasm of breast: Secondary | ICD-10-CM | POA: Insufficient documentation

## 2021-02-27 DIAGNOSIS — Z833 Family history of diabetes mellitus: Secondary | ICD-10-CM | POA: Diagnosis not present

## 2021-02-27 DIAGNOSIS — Z79890 Hormone replacement therapy (postmenopausal): Secondary | ICD-10-CM | POA: Insufficient documentation

## 2021-02-27 DIAGNOSIS — Z8379 Family history of other diseases of the digestive system: Secondary | ICD-10-CM | POA: Diagnosis not present

## 2021-02-27 DIAGNOSIS — Z818 Family history of other mental and behavioral disorders: Secondary | ICD-10-CM | POA: Insufficient documentation

## 2021-02-27 DIAGNOSIS — Z8249 Family history of ischemic heart disease and other diseases of the circulatory system: Secondary | ICD-10-CM | POA: Diagnosis not present

## 2021-02-27 DIAGNOSIS — Z7189 Other specified counseling: Secondary | ICD-10-CM | POA: Insufficient documentation

## 2021-02-27 DIAGNOSIS — Z79899 Other long term (current) drug therapy: Secondary | ICD-10-CM | POA: Diagnosis not present

## 2021-02-27 DIAGNOSIS — E876 Hypokalemia: Secondary | ICD-10-CM | POA: Diagnosis not present

## 2021-02-27 DIAGNOSIS — Z87891 Personal history of nicotine dependence: Secondary | ICD-10-CM | POA: Insufficient documentation

## 2021-02-27 DIAGNOSIS — Z800 Family history of malignant neoplasm of digestive organs: Secondary | ICD-10-CM | POA: Diagnosis not present

## 2021-02-27 DIAGNOSIS — M8580 Other specified disorders of bone density and structure, unspecified site: Secondary | ICD-10-CM | POA: Diagnosis not present

## 2021-02-27 LAB — CBC WITH DIFFERENTIAL/PLATELET
Abs Immature Granulocytes: 0.03 10*3/uL (ref 0.00–0.07)
Basophils Absolute: 0 10*3/uL (ref 0.0–0.1)
Basophils Relative: 1 %
Eosinophils Absolute: 0.2 10*3/uL (ref 0.0–0.5)
Eosinophils Relative: 3 %
HCT: 40.3 % (ref 36.0–46.0)
Hemoglobin: 13.6 g/dL (ref 12.0–15.0)
Immature Granulocytes: 1 %
Lymphocytes Relative: 34 %
Lymphs Abs: 2.1 10*3/uL (ref 0.7–4.0)
MCH: 28 pg (ref 26.0–34.0)
MCHC: 33.7 g/dL (ref 30.0–36.0)
MCV: 82.9 fL (ref 80.0–100.0)
Monocytes Absolute: 0.6 10*3/uL (ref 0.1–1.0)
Monocytes Relative: 9 %
Neutro Abs: 3.4 10*3/uL (ref 1.7–7.7)
Neutrophils Relative %: 52 %
Platelets: 335 10*3/uL (ref 150–400)
RBC: 4.86 MIL/uL (ref 3.87–5.11)
RDW: 12.6 % (ref 11.5–15.5)
WBC: 6.3 10*3/uL (ref 4.0–10.5)
nRBC: 0 % (ref 0.0–0.2)

## 2021-02-27 LAB — COMPREHENSIVE METABOLIC PANEL
ALT: 28 U/L (ref 0–44)
AST: 23 U/L (ref 15–41)
Albumin: 4.1 g/dL (ref 3.5–5.0)
Alkaline Phosphatase: 54 U/L (ref 38–126)
Anion gap: 12 (ref 5–15)
BUN: 12 mg/dL (ref 8–23)
CO2: 26 mmol/L (ref 22–32)
Calcium: 9.8 mg/dL (ref 8.9–10.3)
Chloride: 96 mmol/L — ABNORMAL LOW (ref 98–111)
Creatinine, Ser: 0.61 mg/dL (ref 0.44–1.00)
GFR, Estimated: >60 mL/min (ref >60)
Glucose, Bld: 103 mg/dL — ABNORMAL HIGH (ref 70–99)
Potassium: 2.8 mmol/L — ABNORMAL LOW (ref 3.5–5.1)
Sodium: 134 mmol/L — ABNORMAL LOW (ref 135–145)
Total Bilirubin: 0.8 mg/dL (ref 0.3–1.2)
Total Protein: 7.4 g/dL (ref 6.5–8.1)

## 2021-02-27 MED ORDER — ANASTROZOLE 1 MG PO TABS: 1.0000 mg | ORAL_TABLET | Freq: Every day | ORAL | 1 refills | Status: DC

## 2021-02-27 NOTE — Progress Notes (Signed)
Hematology/Oncology Consult note [phone] [phone]      Patient Care Team: Idelle Crouch, MD as PCP - General (Internal Medicine) Earlie Server, MD as Consulting Physician (Oncology) Bary Castilla, Forest Gleason, MD as Consulting Physician (General Surgery)  REFERRING PROVIDER: Idelle Crouch, MD  CHIEF COMPLAINTS/REASON FOR VISIT:  Evaluation of right breast cancer  HISTORY OF PRESENTING ILLNESS:   Gabrielle Davidson is a  70 y.o.  female with PMH listed below was seen in consultation at the request of  Idelle Crouch, MD  for evaluation of left breast cancer.  01/02/2021, screening mammogram showed possible mass warrants further evaluation.  Left breast no findings suspicious for malignancy. 01/28/2021, right diagnostic mammogram showed a 12 mm mass upper inner breast which was seen concerning for malignancy.  Adjacent to this mass is a tubular indeterminate mass as well as a subtle area of architectural distortion which measures about 2 mm.  Together these 3 masses are in a linear orientation and span approximately 3.5 cm. 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  Lymphovascular invasion not identified.  ER 91-100% +, PR 91-100%+, HER2 IHC equivocal.  HER2 FISH negative. 02/18/2021, right breast, upper inner quadrant stereotactic core needle biopsy, benign mammary parenchyma with focal pseudo intermedius stromal hyperplasia and fibrocystic changes.  Negative for atypical proliferative breast disease. These results are considered concordant per radiology.  Patient has been evaluated by Dr. Bary Castilla.  Patient presents to establish care.  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  Menarche 11, she does not have children.   Patient had a being on hormone replacement therapy for several years.  She stopped after discovering cancer diagnosis. History of OCP  use.  History of thyroid cancer, patient follows up with Dr. Gabriel Carina. Review of Systems  Constitutional:  Negative for appetite change, chills, fatigue and fever.  HENT:   Negative for hearing loss and voice change.   Eyes:  Negative for eye problems.  Respiratory:  Negative for chest tightness and cough.   Cardiovascular:  Negative for chest pain.  Gastrointestinal:  Negative for abdominal distention, abdominal pain and blood in stool.  Endocrine: Negative for hot flashes.  Genitourinary:  Negative for difficulty urinating and frequency.   Musculoskeletal:  Negative for arthralgias.  Skin:  Negative for itching and rash.  Neurological:  Negative for extremity weakness.  Hematological:  Negative for adenopathy.  Psychiatric/Behavioral:  Negative for confusion.    MEDICAL HISTORY:  Past Medical History:  Diagnosis Date   Breast cancer ([place])    Hepatitis    A-YEARS AGO   Hypercholesteremia    Hypertension    Hypothyroidism    Osteopenia of multiple sites    Statin-induced myositis    Thyroid cancer ([place])     SURGICAL HISTORY: Past Surgical History:  Procedure Laterality Date   BREAST BIOPSY Right 02/10/2021   [place] bx, heart marker, path pending   BREAST BIOPSY Right 02/18/2021   stereo bx, distortion "COIL" clip-path pending   LUMBAR LAMINECTOMY/ DECOMPRESSION WITH MET-RX Right 09/16/2020   Procedure: Right Lumbar five Sacral one Minimally  invasive Discectomy with Metrex;  Surgeon: Judith Part, MD;  Location: [place];  Service: Neurosurgery;  Laterality: Right;   NO PAST SURGERIES     THYROIDECTOMY Bilateral 03/26/2015   Procedure: THYROIDECTOMY;  Surgeon: Clyde Canterbury, MD;  Location: [facility];  Service: ENT;  Laterality: Bilateral;    SOCIAL HISTORY: Social History  Socioeconomic History   Marital status: Married    Spouse name: Not on file   Number of children: Not on file   Years of education: Not on file   Highest education level: Not on file  Occupational  History   Not on file  Tobacco Use   Smoking status: Former    Packs/day: 1.00    Years: 20.00    Pack years: 20.00    Types: Cigarettes    Quit date: 03/21/1983    Years since quitting: 37.9   Smokeless tobacco: Never  Vaping Use   Vaping Use: Never used  Substance and Sexual Activity   Alcohol use: No    Comment: RARE   Drug use: No   Sexual activity: Not Currently  Other Topics Concern   Not on file  Social History Narrative   Not on file   Social Determinants of Health   Financial Resource Strain: Not on file  Food Insecurity: Not on file  Transportation Needs: Not on file  Physical Activity: Not on file  Stress: Not on file  Social Connections: Not on file  Intimate Partner Violence: Not on file    FAMILY HISTORY: Family History  Problem Relation Age of Onset   Alzheimer's disease Mother    Hernia Mother    Heart disease Father    Heart attack Maternal Grandfather    Diabetes Paternal Grandmother    Colon cancer Maternal Aunt    Breast cancer Paternal Aunt 60    ALLERGIES:  has No Known Allergies.  MEDICATIONS:  Current Outpatient Medications  Medication Sig Dispense Refill   anastrozole (ARIMIDEX) 1 MG tablet Take 1 tablet (1 mg total) by mouth daily. 30 tablet 1   Ascorbic Acid (VITAMIN C) 1000 MG tablet Take 1,000 mg by mouth in the morning and at bedtime.     aspirin EC 81 MG tablet Take 1 tablet (81 mg total) by mouth at bedtime. Restart 09/23/20 30 tablet 11   Calcium Carbonate-Vitamin D3 600-400 MG-UNIT TABS Take 1-2 tablets by mouth See admin instructions. 1 tablet in the morning and 2 tablets at bedtime     chlorthalidone (HYGROTON) 25 MG tablet Take 25 mg by mouth daily.     FLUoxetine (PROZAC) 40 MG capsule Take 40 mg by mouth every morning.      levothyroxine (SYNTHROID) 100 MCG tablet Take 100 mcg by mouth daily before breakfast.     losartan (COZAAR) 100 MG tablet Take 100 mg by mouth daily.     Multiple Vitamin (MULTI-VITAMIN) tablet Take by  mouth.     tiZANidine (ZANAFLEX) 4 MG tablet Take 1 tablet (4 mg total) by mouth every 8 (eight) hours as needed for muscle spasms. 30 tablet 0   vitamin B-12 (CYANOCOBALAMIN) 1000 MCG tablet Take 1,000 mcg by mouth daily.     gabapentin (NEURONTIN) 300 MG capsule Take 300 mg by mouth 3 (three) times daily.     meclizine (ANTIVERT) 25 MG tablet Take 1 tablet (25 mg total) by mouth 3 (three) times daily as needed for dizziness. 20 tablet 0   No current facility-administered medications for this visit.     PHYSICAL EXAMINATION: ECOG PERFORMANCE STATUS: 0 - Asymptomatic Vitals:   02/27/21 1123  BP: 128/84  Pulse: 70  Resp: 19  Temp: (!) 95.3 F (35.2 C)  SpO2: 97%   Filed Weights   02/27/21 1123  Weight: 144 lb 1.6 oz (65.4 kg)    Physical Exam Constitutional:  General: She is not in acute distress. HENT:     Head: Normocephalic and atraumatic.  Eyes:     General: No scleral icterus. Cardiovascular:     Rate and Rhythm: Normal rate and regular rhythm.     Heart sounds: Normal heart sounds.  Pulmonary:     Effort: Pulmonary effort is normal. No respiratory distress.     Breath sounds: No wheezing.  Abdominal:     General: Bowel sounds are normal. There is no distension.     Palpations: Abdomen is soft.  Musculoskeletal:        General: No deformity. Normal range of motion.     Cervical back: Normal range of motion and neck supple.  Skin:    General: Skin is warm and dry.     Findings: No erythema or rash.  Neurological:     Mental Status: She is alert and oriented to person, place, and time. Mental status is at baseline.     Cranial Nerves: No cranial nerve deficit.     Coordination: Coordination normal.  Psychiatric:        Mood and Affect: Mood normal.    LABORATORY DATA:  I have reviewed the data as listed Lab Results  Component Value Date   WBC 6.3 02/27/2021   HGB 13.6 02/27/2021   HCT 40.3 02/27/2021   MCV 82.9 02/27/2021   PLT 335 02/27/2021    Recent Labs    08/17/20 1447 09/10/20 0900 02/27/21 1234  NA 130* 136 134*  K 2.7* 3.2* 2.8*  CL 95* 101 96*  CO2 21* 27 26  GLUCOSE 96 115* 103*  BUN '12 13 12  ' CREATININE 1.16* 0.89 0.61  CALCIUM 10.1 10.4* 9.8  GFRNONAA 51* >60 >60  PROT  --  6.3* 7.4  ALBUMIN  --  3.7 4.1  AST  --  25 23  ALT  --  35 28  ALKPHOS  --  46 54  BILITOT  --  0.7 0.8   Iron/TIBC/Ferritin/ %Sat No results found for: IRON, TIBC, FERRITIN, IRONPCTSAT    RADIOGRAPHIC STUDIES: I have personally reviewed the radiological images as listed and agreed with the findings in the report. MM CLIP PLACEMENT RIGHT  Result Date: 02/18/2021 CLINICAL DATA:  Status post stereotactic biopsy today for possible subtle architectural distortion within the upper RIGHT breast. EXAM: 3D DIAGNOSTIC RIGHT MAMMOGRAM POST STEREOTACTIC BIOPSY COMPARISON:  Previous exam(s). FINDINGS: 3D Mammographic images were obtained following stereotactic guided biopsy of possible subtle architectural distortion within the upper RIGHT breast. The biopsy marking clip is in expected position at the site of biopsy. IMPRESSION: Appropriate positioning of the coil shaped biopsy marking clip at the site of biopsy in the upper RIGHT breast. Final Assessment: Post Procedure Mammograms for Marker Placement Electronically Signed   By: Franki Cabot M.D.   On: 02/18/2021 15:11  MM CLIP PLACEMENT RIGHT  Result Date: 02/10/2021 CLINICAL DATA:  Post biopsy mammogram of the right breast for clip placement. EXAM: 3D DIAGNOSTIC RIGHT MAMMOGRAM POST ULTRASOUND BIOPSY COMPARISON:  Previous exam(s). FINDINGS: Prior to the post biopsy mammogram, an image was obtained to determine if the small area of questioned distortion in the superior right breast could be reproduced for stereotactic biopsy. This area could not be identified, though there is lidocaine and biopsy changes which may be obscuring the subtle finding. 3D Mammographic images were obtained following  ultrasound guided biopsy of a mass in the upper inner right breast. The biopsy marking clip is in expected position  at the site of biopsy. IMPRESSION: 1. The questioned distortion in the superior right breast could not be reproduced today for stereotactic biopsy (the suspected corresponding lesion could also not be reproduced sonographically). We will bring the patient back within a week to allow for biopsy changes and lidocaine to dissipate. If the questioned distortion is visible at that time, stereotactic biopsy will be performed. If not, MRI is recommended for further evaluation. 2. Appropriate positioning of the a heart shaped biopsy marking clip at the site of biopsy in the superior right breast. Final Assessment: Post Procedure Mammograms for Marker Placement Electronically Signed   By: Ammie Ferrier M.D.   On: 02/10/2021 11:05  MM RT BREAST BX W LOC DEV 1ST LESION IMAGE BX SPEC STEREO GUIDE  Addendum Date: 02/20/2021   ADDENDUM REPORT: 02/20/2021 08:38 ADDENDUM: Pathology revealed BENIGN MAMMARY PARENCHYMA WITH FOCAL PSEUDO ANGIOMATOUS STROMAL HYPERPLASIA, FIBROCYSTIC CHANGES- NEGATIVE FOR ATYPICAL PROLIFERATIVE BREAST DISEASE of the RIGHT breast, upper inner quadrant (coil clip). This was found to be concordant by Dr. Franki Cabot. Pathology results were discussed with the patient by telephone. The patient reported doing well after the biopsy with tenderness at the site. Post biopsy instructions and care were reviewed and questions were answered. The patient was encouraged to call [facility] for any additional concerns. The patient has a recent diagnosis of RIGHT breast cancer and should follow her outlined treatment plan. Pathology results reported by Stacie Acres RN on 02/20/2021. Electronically Signed   By: Franki Cabot M.D.   On: 02/20/2021 08:38   Result Date: 02/20/2021 CLINICAL DATA:  Patient with a possible subtle architectural distortion  within the upper RIGHT breast presents today for stereotactic biopsy. Ultrasound-guided biopsy was performed for a 12 mm RIGHT breast mass on 02/10/2021 revealing invasive mammary carcinoma. Today's biopsy target (the possible subtle architectural distortion) is located proximally 3.5 cm posterior to a biopsy-proven carcinoma with associated heart shaped clip (identified on ultrasound-guided biopsy dated 02/10/2021). EXAM: RIGHT BREAST STEREOTACTIC CORE NEEDLE BIOPSY COMPARISON:  Previous exams. FINDINGS: The patient and I discussed the procedure of stereotactic-guided biopsy including benefits and alternatives. We discussed the high likelihood of a successful procedure. We discussed the risks of the procedure including infection, bleeding, tissue injury, clip migration, and inadequate sampling. Informed written consent was given. The usual time out protocol was performed immediately prior to the procedure. On today's preprocedure images, the area of subtle architectural distortion in the upper RIGHT breast is reproduced although the distortion is less conspicuous on today's exam. Using sterile technique and 1% Lidocaine as local anesthetic, under stereotactic guidance, a 9 gauge vacuum assisted device was used to perform core needle biopsy of the possible subtle architectural distortion in the upper inner quadrant of the RIGHT breast using a medial approach. Lesion quadrant: Upper inner quadrant At the conclusion of the procedure, coil shaped tissue marker clip was deployed into the biopsy cavity. Follow-up 2-view mammogram was performed and dictated separately. IMPRESSION: Stereotactic-guided biopsy of possible subtle architectural distortion within the upper inner quadrant of the RIGHT breast. No apparent complications. Electronically Signed: By: Franki Cabot M.D. On: 02/18/2021 14:58   MM RT BREAST BX W LOC DEV 1ST LESION IMAGE BX SPEC STEREO GUIDE  Result Date: 02/10/2021 CLINICAL DATA:  70 year old  female presenting for ultrasound-guided biopsy of a right breast mass. EXAM: ULTRASOUND GUIDED RIGHT BREAST CORE NEEDLE BIOPSY COMPARISON:  Previous exam(s). PROCEDURE: I met with the patient and we discussed the  procedure of ultrasound-guided biopsy, including benefits and alternatives. We discussed the high likelihood of a successful procedure. We discussed the risks of the procedure, including infection, bleeding, tissue injury, clip migration, and inadequate sampling. Informed written consent was given. The usual time-out protocol was performed immediately prior to the procedure. #1 Lesion quadrant: Upper inner quadrant Using sterile technique and 1% Lidocaine as local anesthetic, under direct ultrasound visualization, a 14 gauge spring-loaded device was used to perform biopsy of a mass in the right breast at 1 o'clock, 8 cm from the nipple using an inferolateral approach. At the conclusion of the procedure a heart shaped tissue marker clip was deployed into the biopsy cavity. Ultrasound targeted to the right breast at 1 o'clock, 10 cm from the nipple could not reproduce the 2 mm mass identified on the diagnostic workup. No ultrasound-guided biopsy of this site was performed. Follow up 2 view mammogram was performed and dictated separately. IMPRESSION: 1. Ultrasound guided biopsy of a right breast mass at 1 o'clock. No apparent complications. 2. The second site recommended for biopsy at 1 o'clock, 10 cm from the nipple could not be reproduced for biopsy today. We will attempt stereotactic biopsy of the site today, which if successful will be dictated in a separate report. If we cannot reproduce the area mammographically, this will be explain in the post biopsy mammogram report. Electronically Signed   By: Ammie Ferrier M.D.   On: 02/10/2021 10:35  [place] RT BREAST BX W LOC DEV 1ST LESION IMG BX SPEC US GUIDE  Addendum Date: 02/12/2021   ADDENDUM REPORT: 02/12/2021 11:51 ADDENDUM: PATHOLOGY revealed: A. RIGHT  BREAST, 1:00; ULTRASOUND-GUIDED BIOPSY: - INVASIVE MAMMARY CARCINOMA, NO SPECIAL TYPE. Size of invasive carcinoma: 6 mm in this sample. Grade 2. Ductal carcinoma in situ: Focally present, intermediate grade. Lymphovascular invasion: Not identified. Pathology results are CONCORDANT with imaging findings, per Dr. Ammie Ferrier. Pathology results and recommendations were discussed with patient via telephone on 02/11/2021. Patient reported biopsy site doing well with no adverse symptoms, and only slight tenderness at the site. Post biopsy care instructions were reviewed, questions were answered and my direct phone number was provided. Patient was instructed to call [facility] for any additional questions or concerns related to biopsy site. RECOMMENDATIONS: 1. Surgical consultation. Request for surgical consultation relayed to Al Pimple RN at [facility] by Electa Sniff RN on 02/11/2021. 2. Patient instructed to return in one week for diagnostic RIGHT breast mammogram to evaluate additional subtle area of distortion in RIGHT breast, which could not be seen at time of biopsy, due to biopsy changes. If the distortion is visible, then stereotactic guided biopsy should be performed. [facility] will call the patient with mammogram/biopsy appointment details. 3. If additional finding cannot be seen on mammogram, recommend bilateral breast MRI to evaluate extent of disease. Pathology results reported by Electa Sniff RN on 02/12/2021. Electronically Signed   By: Ammie Ferrier M.D.   On: 02/12/2021 11:51   Result Date: 02/12/2021 CLINICAL DATA:  70 year old female presenting for ultrasound-guided biopsy of a right breast mass. EXAM: ULTRASOUND GUIDED RIGHT BREAST CORE NEEDLE BIOPSY COMPARISON:  Previous exam(s). PROCEDURE: I met with the patient and we discussed the procedure of ultrasound-guided biopsy, including benefits and alternatives. We discussed the high  likelihood of a successful procedure. We discussed the risks of the procedure, including infection, bleeding, tissue injury, clip migration, and inadequate sampling. Informed written consent was given. The usual time-out protocol was performed immediately  prior to the procedure. #1 Lesion quadrant: Upper inner quadrant Using sterile technique and 1% Lidocaine as local anesthetic, under direct ultrasound visualization, a 14 gauge spring-loaded device was used to perform biopsy of a mass in the right breast at 1 o'clock, 8 cm from the nipple using an inferolateral approach. At the conclusion of the procedure a heart shaped tissue marker clip was deployed into the biopsy cavity. Ultrasound targeted to the right breast at 1 o'clock, 10 cm from the nipple could not reproduce the 2 mm mass identified on the diagnostic workup. No ultrasound-guided biopsy of this site was performed. Follow up 2 view mammogram was performed and dictated separately. IMPRESSION: 1. Ultrasound guided biopsy of a right breast mass at 1 o'clock. No apparent complications. 2. The second site recommended for biopsy at 1 o'clock, 10 cm from the nipple could not be reproduced for biopsy today. We will attempt stereotactic biopsy of the site today, which if successful will be dictated in a separate report. If we cannot reproduce the area mammographically, this will be explain in the post biopsy mammogram report. Electronically Signed: By: Ammie Ferrier M.D. On: 02/11/2021 09:38      ASSESSMENT & PLAN:  1. Invasive carcinoma of breast ([place])   2. Goals of care, counseling/discussion   3. Osteopenia, unspecified location   4. Hypokalemia    #Right invasive carcinoma of breast, ER/PR positive, HER2 negative. Images and pathology reports were reviewed and discussed with patient. I recommend upfront surgical resection.  Dr. Bary Castilla has discussed with patient about options of breast conservation surgery with sentinel lymph node biopsy versus  simple mastectomy with sentinel lymph node biopsy. I discussed with patient about need of sending molecular testing of tumor specimen to determine benefit of adjuvant chemotherapy.  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 We also discussed about role of adjuvant radiation and endocrine therapy.  Family history of breast cancer.  I recommend patient to establish care with genetic counselor for genetic testing.  She agrees.  Patient would like to defer her surgery until January 2023.  She is interested in taking endocrine therapy until her deferred to surgery.  We discussed that neoadjuvant endocrine therapy is not a standard way of treatments but if she desires some form of treatment due to the delay of surgery plan, I think it is reasonable to take 4 to 6 weeks of aromatase inhibitors.  Ideal duration of neoadjuvant endocrine therapy is unknown. Discussed about the rationale and potential side effects of aromatase inhibitors including but not limited to hot flash, joint/muscle pain, mood swings, weight gain, bone loss, hyperlipidemia, ischemia heart disease, cataracts, blood clots, etc. Patient expresses her concern about potential side effects of aromatase inhibitor.  She is not on statins for her hypercholesterolemia.  Recommend patient to further discuss with primary care provider.  She plans to try and if she is not able to tolerate aromatase inhibitor, I recommend patient to get surgery done ASAP. A prescription of Arimidex was sent to pharmacy.  Osteopenia 11/06/2019, [facility] at [facility] showed osteopenia.  FRAX 10-year risk of hip fracture 1.0.  10-year risk of any major fracture 13%.  Patient will need to repeat bone density every 2 years.  #Hypokalemia, potassium 2.8.  Review patient's previous lab records.  Patient is chronically hypokalemic.  Recommend patient to start potassium chloride 20 meq twice daily x 7 days, repeat BMP.  Check CBC,  CMP, CA 15-3, [place] [address] Orders Placed This  Encounter  Procedures   CBC with Differential/Platelet    Standing Status:   Future    Number of Occurrences:   1    Standing Expiration Date:   02/27/2022   Comprehensive metabolic panel    Standing Status:   Future    Number of Occurrences:   1    Standing Expiration Date:   02/27/2022   Cancer antigen 15-3    Standing Status:   Future    Number of Occurrences:   1    Standing Expiration Date:   02/27/2022   Cancer antigen 27.29    Standing Status:   Future    Number of Occurrences:   1    Standing Expiration Date:   02/27/2022   CEA    Standing Status:   Future    Number of Occurrences:   1    Standing Expiration Date:   02/27/2022    All questions were answered. The patient knows to call the clinic with any problems questions or concerns.   Idelle Crouch, MD    Return of visit:  Thank you for this kind referral and the opportunity to participate in the care of this patient. A copy of today's note is routed to referring provider   Earlie Server, MD, PhD [facility] Hematology Oncology 02/27/2021

## 2021-02-28 LAB — CANCER ANTIGEN 15-3: CA 15-3: 13.7 U/mL (ref 0.0–25.0)

## 2021-02-28 LAB — CANCER ANTIGEN 27.29: CA 27.29: 19.6 U/mL (ref 0.0–38.6)

## 2021-02-28 LAB — CEA: CEA: 1.2 ng/mL (ref 0.0–4.7)

## 2021-02-28 MED ORDER — POTASSIUM CHLORIDE CRYS ER 20 MEQ PO TBCR: 20.0000 meq | EXTENDED_RELEASE_TABLET | Freq: Two times a day (BID) | ORAL | 0 refills | Status: DC

## 2021-03-02 ENCOUNTER — Encounter: Payer: Self-pay | Admitting: Oncology

## 2021-03-02 ENCOUNTER — Encounter: Payer: Self-pay | Admitting: Plastic Surgery

## 2021-03-02 ENCOUNTER — Telehealth: Payer: Self-pay

## 2021-03-02 NOTE — Telephone Encounter (Signed)
Please contact pt to schedule labs in 1 week. Thanks

## 2021-03-02 NOTE — Telephone Encounter (Signed)
-----   Message from Earlie Server, MD sent at 02/28/2021 12:30 AM EST ----- My chart message sent. Please arrange lab appt 1 week. Thanks.

## 2021-03-04 ENCOUNTER — Other Ambulatory Visit: Payer: Self-pay | Admitting: *Deleted

## 2021-03-04 DIAGNOSIS — Z803 Family history of malignant neoplasm of breast: Secondary | ICD-10-CM

## 2021-03-04 DIAGNOSIS — C50919 Malignant neoplasm of unspecified site of unspecified female breast: Secondary | ICD-10-CM

## 2021-03-05 ENCOUNTER — Other Ambulatory Visit: Payer: Self-pay | Admitting: General Surgery

## 2021-03-05 DIAGNOSIS — Z170 Estrogen receptor positive status [ER+]: Secondary | ICD-10-CM

## 2021-03-06 ENCOUNTER — Other Ambulatory Visit: Payer: Self-pay | Admitting: General Surgery

## 2021-03-06 NOTE — Progress Notes (Signed)
Subjective:     Patient ID: Gabrielle Davidson is a 70 y.o. female.   HPI   The following portions of the patient's history were reviewed and updated as appropriate.   This a new patient is here today for: office visit. The patient has been referred by Dr. Doy Hutching for evaluation of a newly diagnosed right breast cancer. The patient had an ultrasound guided right breast biopsy done on 02-10-21 by the [facility]. Patient reports she had liposuction 20 + years ago. She reports due to this they called her back every 6 months for mammograms for a period of time. Patient did not feel any breast concerns prior to her recent mammogram. The patient states she is scheduled for a right breast stereotactic biopsy at [facility] today. She reports if they are unable to find the area to biopsy that the radiologist has recommended a breast MRI.    The patient has been on hormone replacement therapy (estradiol/progesterone) since menopause due to profound vasomotor symptoms.  These were recently discontinued.  So far, aggressive vasomotor symptoms have not recurred.   The patient reports her bra size is 36 DDD.   The patient reports paternal aunt may have had breast cancer in her 64s, but lived to her 30s.      Chief Complaint  Patient presents with   Breast Cancer      BP 122/84    Pulse 69    Temp 36.8 C (98.2 F)    Ht 147.6 cm (4' 10.11")    Wt 65.8 kg (145 lb)    LMP  (LMP Unknown)    SpO2 96%    BMI 30.19 kg/m        Past Medical History:  Diagnosis Date   Breast cancer (CMS-HCC) 02/10/2021    right   Cervical polyp     Chicken pox     GERD (gastroesophageal reflux disease)     Hepatitis     Hypertension     Hypothyroidism, postsurgical     Osteopenia     Thyroid cancer (CMS-HCC)             Past Surgical History:  Procedure Laterality Date   COLONOSCOPY   01/29/2009    @ [facility]    COLONOSCOPY   02/05/2014    Dr. Loni Muse. Rhoton @ [place] - Int. Hemorrh., rpt 5 yrs  per proviider   COLONOSCOPY   02/13/2019    PHCP/Non-bleeding internal hemorrhoids - 5 year repeat TKT   LAMINECTOMY LUMBAR SPINE   09/16/2020   THYROIDECTOMY TOTAL   2017   ultrasound guided breast biopsy Right 02/10/2021   UPPER GASTROINTESTINAL ENDOSCOPY   02/05/2014    Dr. Loni Muse. Rhoton @ [facility]                 OB History     Gravida  0   Para  0   Term  0   Preterm  0   AB  0   Living  0      SAB  0   IAB  0   Ectopic  0   Molar  0   Multiple  0   Live Births  0        Obstetric Comments  Age at first period 42             Social History           Socioeconomic History   Marital  status: Married  Tobacco Use   Smoking status: Former      Packs/day: 1.00      Years: 20.00      Pack years: 20.00      Types: Cigarettes      Quit date: 1985      Years since quitting: 37.9   Smokeless tobacco: Never  Vaping Use   Vaping Use: Never used  Substance and Sexual Activity   Alcohol use: Yes      Comment: once per month   Drug use: No   Sexual activity: Not Currently      Partners: Male      Birth control/protection: Post-menopausal        No Known Allergies   Current Medications        Current Outpatient Medications  Medication Sig Dispense Refill   ascorbic acid, vitamin C, (VITAMIN C) 1000 MG tablet Take 1,000 mg by mouth 2 (two) times daily.       aspirin 81 MG EC tablet Take 81 mg by mouth once daily.       calcium carbonate-vit D3-min 600 mg calcium- 400 unit Tab Take by mouth       calcium carbonate-vitamin D3 (CALTRATE 600+D) 600 mg(1,569m) -200 unit tablet Take 1 tablet by mouth 2 (two) times daily with meals.       chlorthalidone 25 MG tablet Take 1 tablet (25 mg total) by mouth once daily 90 tablet 3   cyanocobalamin (VITAMIN B12) 1000 MCG tablet Take 1,000 mcg by mouth once daily.       FLUoxetine (PROZAC) 40 MG capsule Take 1 capsule by mouth once daily 90 capsule 0   fluticasone propionate (FLONASE) 50 mcg/actuation  nasal spray Place 2 sprays into both nostrils at bedtime 16 g 5   levothyroxine (EUTHYROX) 100 MCG tablet Take 1 tablet (100 mcg total) by mouth every morning before breakfast (0630) ON AN EMPTY STOMACH WITH A GLASS OF WATER AT LEAST 30 TO 60 MINUTES BEFORE BREAKFAST 90 tablet 3   losartan (COZAAR) 100 MG tablet Take 1 tablet (100 mg total) by mouth once daily 100 tablet 1   meclizine (ANTIVERT) 25 mg tablet Take 25 mg by mouth 3 (three) times daily as needed       multivitamin tablet Take 1 tablet by mouth once daily.       VITAMIN D3 ORAL Take 1,000 Units by mouth once daily       amitriptyline (ELAVIL) 10 MG tablet Take 1 tablet (10 mg total) by mouth at bedtime (Patient not taking: Reported on 02/18/2021) 30 tablet 5   estradioL (ESTRACE) 1 MG tablet TAKE 1 TABLET BY MOUTH ONCE DAILY  *HAZARDOUS DRUG: WEAR GLOVES* **BOTTLE* (Patient not taking: Reported on 02/18/2021) 90 tablet 1   progesterone (PROMETRIUM) 100 MG capsule TAKE 1 CAPSULE BY MOUTH ONCE DAILY **BOTTLE* (Patient not taking: Reported on 02/18/2021) 90 capsule 1    No current facility-administered medications for this visit.             Family History  Problem Relation Age of Onset   Alzheimer's disease Mother     Hernia Mother     Heart disease Father     No Known Problems Maternal Grandmother     Myocardial Infarction (Heart attack) Maternal Grandfather     Diabetes Paternal Grandmother     No Known Problems Paternal Grandfather     Thyroid cancer Neg Hx          Labs  and Radiology:    February 10, 2021 right breast biopsy:   A. RIGHT BREAST, 1:00; ULTRASOUND-GUIDED BIOPSY:  - INVASIVE MAMMARY CARCINOMA, NO SPECIAL TYPE.   Size of invasive carcinoma: 6 mm in this sample  Histologic grade of invasive carcinoma: Grade 2                       Glandular/tubular differentiation score: 3                       Nuclear pleomorphism score: 2                       Mitotic rate score: 1                       Total score: 6   Ductal carcinoma in situ: Focally present, intermediate grade  Lymphovascular invasion: Not identified    ADDENDUM:  CASE SUMMARY: BREAST BIOMARKER TESTS  Estrogen Receptor (ER) Status: POSITIVE          Percentage of cells with nuclear positivity: 91-100%          Average intensity of staining: Strong   Progesterone Receptor (PgR) Status: POSITIVE          Percentage of cells with nuclear positivity: 91-100%          Average intensity of staining: Moderate   HER2 (by immunohistochemistry): EQUIVOCAL (Score 2+)                       Percentage of cells with uniform intense complete  membrane staining: Less than 10%   Ki-67: Not performed    Imaging review:   Mammograms from December 27, 2019 through February 18, 2021 i and associated ultrasound images were reviewed   2 biopsy sites now identified, 25 mm apart.  Both in the 12-1 o'clock position of the right breast.   Review of Systems  Constitutional: Negative for chills and fever.  Respiratory: Negative for cough.          Objective:   Physical Exam Exam conducted with a chaperone present.  Constitutional:      Appearance: Normal appearance.  Cardiovascular:     Rate and Rhythm: Normal rate and regular rhythm.     Pulses: Normal pulses.     Heart sounds: Normal heart sounds.  Pulmonary:     Effort: Pulmonary effort is normal.     Breath sounds: Normal breath sounds.  Chest:  Breasts:    Right: Normal.     Left: Normal.       Comments: Bilateral ptosis. No skin distortion. No palpable masses.  Musculoskeletal:     Cervical back: Neck supple.  Lymphadenopathy:     Upper Body:     Right upper body: No supraclavicular or axillary adenopathy.     Left upper body: No supraclavicular or axillary adenopathy.  Skin:    General: Skin is warm and dry.  Neurological:     Mental Status: She is alert and oriented to person, place, and time.  Psychiatric:        Mood and Affect: Mood normal.        Behavior: Behavior  normal.           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  HER2/neu pending.  Unlikely contiguous disease between the 2 recently completed biopsy sites.  Plan:     Options for management were reviewed in detail.  Breast conservation with radiation therapy was presented is equivalent to mastectomy.  The patient is at present cool to the idea of radiation, and I reviewed with her the significant increase in recurrence even with estrogen blockade.   She is a Software engineer, and has been reviewing the literature quite extensively on aromatase inhibitors and tamoxifen.  We went over each of these drugs pros and cons.   Where she did have breast conservation she is interested in symmetry surgery and for that reason a referral has been placed to [facility], DO and plastics for her input.   The patient's birthday is in the middle of January and she was planning a trip, and would likely defer surgical intervention until after that is completed.  In light of this, she may be a candidate for the initiation of estrogen blockade in the 4-6 weeks between complete work-up and consultation appointments, vacation plans and surgery.   The patient is scheduled to meet with Dr. Tasia Catchings from medical oncology next week and will discuss with her appropriate estrogen blockade options.   75 minutes was spent reviewing imaging studies, pathology reports and patient exam and consultation.  This note is partially prepared by Ledell Noss, CMA acting as a scribe in the presence of Dr. Hervey Ard, MD.    The documentation recorded by the scribe accurately reflects the service I personally performed and the decisions made by me.    Robert Bellow, MD FACS  March 06, 2021:  Plans for interval use of aromatase inhibitor as surgery deferred until after upcoming birthday trip mid January.   Plastics to assist with oncoplastic resection and contralateral symmetry  procedure.

## 2021-03-10 ENCOUNTER — Encounter: Payer: Self-pay | Admitting: Plastic Surgery

## 2021-03-10 ENCOUNTER — Inpatient Hospital Stay: Payer: Medicare HMO

## 2021-03-10 ENCOUNTER — Other Ambulatory Visit: Payer: Self-pay

## 2021-03-10 DIAGNOSIS — E876 Hypokalemia: Secondary | ICD-10-CM

## 2021-03-10 DIAGNOSIS — C50211 Malignant neoplasm of upper-inner quadrant of right female breast: Secondary | ICD-10-CM | POA: Diagnosis not present

## 2021-03-10 LAB — BASIC METABOLIC PANEL
Anion gap: 8 (ref 5–15)
BUN: 19 mg/dL (ref 8–23)
CO2: 28 mmol/L (ref 22–32)
Calcium: 8.9 mg/dL (ref 8.9–10.3)
Chloride: 98 mmol/L (ref 98–111)
Creatinine, Ser: 0.69 mg/dL (ref 0.44–1.00)
GFR, Estimated: >60 mL/min (ref >60)
Glucose, Bld: 107 mg/dL — ABNORMAL HIGH (ref 70–99)
Potassium: 2.8 mmol/L — ABNORMAL LOW (ref 3.5–5.1)
Sodium: 134 mmol/L — ABNORMAL LOW (ref 135–145)

## 2021-03-11 ENCOUNTER — Telehealth: Payer: Self-pay

## 2021-03-11 ENCOUNTER — Other Ambulatory Visit: Payer: Self-pay | Admitting: Oncology

## 2021-03-11 MED ORDER — POTASSIUM CHLORIDE CRYS ER 20 MEQ PO TBCR: 20.0000 meq | EXTENDED_RELEASE_TABLET | Freq: Two times a day (BID) | ORAL | 0 refills | Status: DC

## 2021-03-11 NOTE — Telephone Encounter (Signed)
-----   Message from Earlie Server, MD sent at 03/11/2021  8:35 AM EST ----- Let her know that her potassium level remains low. This seems to be a chronic issue for her. I recommend her to take potassium pills, rx was refilled for 1 month supply. Would you please contact Dr.Sparks's office to see if they could help managing and monitor her potassium. Thanks.

## 2021-03-11 NOTE — Telephone Encounter (Signed)
Detailed message left on cell phone and Mychart message sent to let pt know of results. Labs results faxed to Dr. Doy Hutching office.

## 2021-03-13 ENCOUNTER — Encounter: Payer: Self-pay | Admitting: Body Imaging

## 2021-03-13 ENCOUNTER — Encounter: Payer: Self-pay | Admitting: Oncology

## 2021-03-17 NOTE — Progress Notes (Signed)
Patient ID: Gabrielle Davidson, female    DOB: [birth year], 71 y.o.   MRN: [id number]  Chief Complaint  Patient presents with   Pre-op Exam      ICD-10-CM   1. Malignant neoplasm of upper-outer quadrant of right female breast, unspecified estrogen receptor status ([place])  C50.411        History of Present Illness: Gabrielle Davidson is a 71 y.o.  female  with a history of right breast cancer.  She presents for preoperative evaluation for upcoming procedure, right side partial mastectomy with oncoplastic bilateral breast reduction, scheduled for 04/08/2021 with Dr. Marla Roe.  The patient has not had problems with anesthesia.  Patient states that she is normally taking the anastrozole for her right-sided breast cancer.  Her hypertension is well controlled.  She had thyroid cancer when she was younger and denies any complications from anesthesia.  NKDA.  Denies tape/dressing allergies.  She is approximately 36 DDD and confirmed that she would like to be a C cup.  She denies any personal history of blood clots or clotting disorder.  Denies any recent infections, traumas, or hospitalizations.  Summary of Previous Visit: Patient was seen for initial consult 02/24/2021 with Dr. Marla Roe.  She reports that she is a preoperative bra size of DDD and like to be a C cup.  She expressed that she would like bilateral oncoplastic breast reductions at time of her right-sided partial mastectomy.  Job: She is a Software engineer, works per [employer].   PMH Significant for: Right breast cancer on anastrozole, HLD, HTN, thyroid cancer.   Past Medical History: Allergies: No Known Allergies  Current Medications:  Current Outpatient Medications:    anastrozole (ARIMIDEX) 1 MG tablet, Take 1 tablet (1 mg total) by mouth daily., Disp: 30 tablet, Rfl: 1   Ascorbic Acid (VITAMIN C) 1000 MG tablet, Take 1,000 mg by mouth in the morning and at bedtime., Disp: , Rfl:    aspirin EC 81 MG tablet, Take 1 tablet (81 mg  total) by mouth at bedtime. Restart 09/23/20, Disp: 30 tablet, Rfl: 11   Calcium Carbonate-Vitamin D3 600-400 MG-UNIT TABS, Take 1-2 tablets by mouth See admin instructions. 1 tablet in the morning and 2 tablets at bedtime, Disp: , Rfl:    chlorthalidone (HYGROTON) 25 MG tablet, Take 25 mg by mouth daily., Disp: , Rfl:    FLUoxetine (PROZAC) 40 MG capsule, Take 40 mg by mouth every morning. , Disp: , Rfl:    gabapentin (NEURONTIN) 300 MG capsule, Take 300 mg by mouth 3 (three) times daily., Disp: , Rfl:    levothyroxine (SYNTHROID) 88 MCG tablet, Take by mouth., Disp: , Rfl:    losartan (COZAAR) 100 MG tablet, Take 100 mg by mouth daily., Disp: , Rfl:    Multiple Vitamin (MULTI-VITAMIN) tablet, Take by mouth., Disp: , Rfl:    potassium chloride SA (KLOR-CON M) 20 MEQ tablet, Take 1 tablet (20 mEq total) by mouth 2 (two) times daily., Disp: 60 tablet, Rfl: 0   tiZANidine (ZANAFLEX) 4 MG tablet, Take 1 tablet (4 mg total) by mouth every 8 (eight) hours as needed for muscle spasms., Disp: 30 tablet, Rfl: 0   vitamin B-12 (CYANOCOBALAMIN) 1000 MCG tablet, Take 1,000 mcg by mouth daily., Disp: , Rfl:   Past Medical Problems: Past Medical History:  Diagnosis Date   Breast cancer ([place])    Hepatitis    A-YEARS AGO   Hypercholesteremia    Hypertension    Hypothyroidism  Osteopenia of multiple sites    Statin-induced myositis    Thyroid cancer [facility])     Past Surgical History: Past Surgical History:  Procedure Laterality Date   BREAST BIOPSY Right 02/10/2021   [place] bx, heart marker, path pending   BREAST BIOPSY Right 02/18/2021   stereo bx, distortion "COIL" clip-path pending   LUMBAR LAMINECTOMY/ DECOMPRESSION WITH MET-RX Right 09/16/2020   Procedure: Right Lumbar five Sacral one Minimally  invasive Discectomy with Metrex;  Surgeon: Judith Part, MD;  Location: [place];  Service: Neurosurgery;  Laterality: Right;   NO PAST SURGERIES     THYROIDECTOMY Bilateral 03/26/2015   Procedure:  THYROIDECTOMY;  Surgeon: Clyde Canterbury, MD;  Location: [facility];  Service: ENT;  Laterality: Bilateral;    Social History: Social History   Socioeconomic History   Marital status: Married    Spouse name: Not on file   Number of children: Not on file   Years of education: Not on file   Highest education level: Not on file  Occupational History   Not on file  Tobacco Use   Smoking status: Former    Packs/day: 1.00    Years: 20.00    Pack years: 20.00    Types: Cigarettes    Quit date: 03/21/1983    Years since quitting: 38.0   Smokeless tobacco: Never  Vaping Use   Vaping Use: Never used  Substance and Sexual Activity   Alcohol use: No    Comment: RARE   Drug use: No   Sexual activity: Not Currently  Other Topics Concern   Not on file  Social History Narrative   Not on file   Social Determinants of Health   Financial Resource Strain: Not on file  Food Insecurity: Not on file  Transportation Needs: Not on file  Physical Activity: Not on file  Stress: Not on file  Social Connections: Not on file  Intimate Partner Violence: Not on file    Family History: Family History  Problem Relation Age of Onset   Alzheimer's disease Mother    Hernia Mother    Heart disease Father    Heart attack Maternal Grandfather    Diabetes Paternal Grandmother    Colon cancer Maternal Aunt    Breast cancer Paternal Aunt 9    Review of Systems: ROS Denies recent fevers, chills, infections, or rashes.  Physical Exam: Vital Signs BP (!) 120/57 (BP Location: Right Arm, Patient Position: Sitting, Cuff Size: Normal)    Pulse 67    Ht 4' 11"  (1.499 m)    Wt 147 lb 12.8 oz (67 kg)    SpO2 96%    BMI 29.85 kg/m   Physical Exam Constitutional:      General: Not in acute distress.    Appearance: Normal appearance. Not ill-appearing.  HENT:     Head: Normocephalic and atraumatic.  Eyes:     Pupils: Pupils are equal, round. Cardiovascular:     Rate and Rhythm: Normal rate.    Pulses:  Normal pulses.  Pulmonary:     Effort: No respiratory distress or increased work of breathing.  Speaks in full sentences. Abdominal:     General: Abdomen is flat. No distension.   Musculoskeletal: Normal range of motion. No lower extremity swelling or edema. No varicosities. Skin:    General: Skin is warm and dry.     Findings: No erythema or rash.  Neurological:     Mental Status: Alert and oriented to person, place, and time.  Psychiatric:        Mood and Affect: Mood normal.        Behavior: Behavior normal.    Assessment/Plan: The patient is scheduled for right-sided partial mastectomy with bilateral oncoplastic breast reduction 04/08/2021 with Dr. Marla Roe.  Risks, benefits, and alternatives of procedure discussed, questions answered and consent obtained.    Smoking Status: Non-smoker. Last Mammogram: 01/2021; Results: BI-RADS Category 5, highly suggestive of malignancy.  12 mm mass in right upper inner breast.  Caprini Score: 7; Risk Factors include: Age, BMI greater than 25, history of cancer, and length of planned surgery. Recommendation for mechanical and possibly pharmacological prophylaxis. Will discuss with surgeon and prescribe Lovenox if needed.  Encourage early ambulation.   Pictures obtained: 02/24/2021  Post-op Rx sent to pharmacy: Norco, Zofran, Keflex.  Patient was provided with the General Surgical Risk consent document and Pain Medication Agreement prior to their appointment.  They had adequate time to read through the risk consent documents and Pain Medication Agreement. We also discussed them in person together during this preop appointment. All of their questions were answered to their satisfaction.  Recommended calling if they have any further questions.  Risk consent form and Pain Medication Agreement to be scanned into patient's chart.  The risk that can be encountered with breast reduction were discussed and include the following but not limited to these:   Breast asymmetry, fluid accumulation, firmness of the breast, inability to breast feed, loss of nipple or areola, skin loss, decrease or no nipple sensation, fat necrosis of the breast tissue, bleeding, infection, healing delay.  There are risks of anesthesia, changes to skin sensation and injury to nerves or blood vessels.  The muscle can be temporarily or permanently injured.  You may have an allergic reaction to tape, suture, glue, blood products which can result in skin discoloration, swelling, pain, skin lesions, poor healing.  Any of these can lead to the need for revisonal surgery or stage procedures.  A reduction has potential to interfere with diagnostic procedures.  Nipple or breast piercing can increase risks of infection.  This procedure is best done when the breast is fully developed.  Changes in the breast will continue to occur over time.  Pregnancy can alter the outcomes of previous breast reduction surgery, weight gain and weigh loss can also effect the long term appearance.     Electronically signed by: Krista Blue, PA-C 03/19/2021 12:29 PM

## 2021-03-18 ENCOUNTER — Other Ambulatory Visit: Payer: Self-pay | Admitting: General Surgery

## 2021-03-18 ENCOUNTER — Inpatient Hospital Stay: Payer: Medicare HMO | Attending: Oncology | Admitting: Hospice and Palliative Medicine

## 2021-03-18 DIAGNOSIS — C50919 Malignant neoplasm of unspecified site of unspecified female breast: Secondary | ICD-10-CM

## 2021-03-18 DIAGNOSIS — Z170 Estrogen receptor positive status [ER+]: Secondary | ICD-10-CM

## 2021-03-18 NOTE — Progress Notes (Signed)
Multidisciplinary Oncology Council Documentation  Sandrine R Melman was presented by our [facility] on 03/18/2021, which included representatives from:  Palliative Care Dietitian  Physical/Occupational Therapist Nurse Navigator Genetics Speech Therapist Social work Survivorship RN Financial Navigator Research RN   San currently presents with history of breast cancer.  We reviewed previous medical and familial history, history of present illness, and recent lab results along with all available histopathologic and imaging studies. The [place] considered available treatment options and made the following recommendations/referrals:  -consider referral to [facility] following breast surgery.  The MOC is a meeting of clinicians from various specialty areas who evaluate and discuss patients for whom a multidisciplinary approach is being considered. Final determinations in the plan of care are those of the provider(s).   Today's extended care, comprehensive team conference, Brenda was not present for the discussion and was not examined.

## 2021-03-19 ENCOUNTER — Ambulatory Visit (INDEPENDENT_AMBULATORY_CARE_PROVIDER_SITE_OTHER): Payer: Medicare HMO | Admitting: Physician Assistant

## 2021-03-19 ENCOUNTER — Other Ambulatory Visit: Payer: Self-pay

## 2021-03-19 ENCOUNTER — Encounter: Payer: Self-pay | Admitting: Physician Assistant

## 2021-03-19 VITALS — BP 120/57 | HR 67 | Ht 59.0 in | Wt 147.8 lb

## 2021-03-19 DIAGNOSIS — C50411 Malignant neoplasm of upper-outer quadrant of right female breast: Secondary | ICD-10-CM

## 2021-03-19 MED ORDER — HYDROCODONE-ACETAMINOPHEN 5-325 MG PO TABS: 1.0000 | ORAL_TABLET | Freq: Four times a day (QID) | ORAL | 0 refills | Status: AC | PRN

## 2021-03-19 MED ORDER — ONDANSETRON 4 MG PO TBDP: 4.0000 mg | ORAL_TABLET | Freq: Three times a day (TID) | ORAL | 0 refills | Status: DC | PRN

## 2021-03-19 MED ORDER — CEPHALEXIN 500 MG PO CAPS: 500.0000 mg | ORAL_CAPSULE | Freq: Four times a day (QID) | ORAL | 0 refills | Status: AC

## 2021-03-24 ENCOUNTER — Encounter: Payer: Self-pay | Admitting: Licensed Clinical Social Worker

## 2021-03-24 ENCOUNTER — Inpatient Hospital Stay: Payer: Medicare HMO | Admitting: Licensed Clinical Social Worker

## 2021-03-24 ENCOUNTER — Inpatient Hospital Stay: Payer: Medicare HMO

## 2021-03-24 ENCOUNTER — Other Ambulatory Visit: Payer: Self-pay

## 2021-03-24 DIAGNOSIS — Z800 Family history of malignant neoplasm of digestive organs: Secondary | ICD-10-CM

## 2021-03-24 DIAGNOSIS — C50919 Malignant neoplasm of unspecified site of unspecified female breast: Secondary | ICD-10-CM

## 2021-03-24 DIAGNOSIS — Z803 Family history of malignant neoplasm of breast: Secondary | ICD-10-CM

## 2021-03-24 NOTE — Progress Notes (Signed)
REFERRING PROVIDER: Earlie Server, MD [place],  [place] [address]  PRIMARY PROVIDER:  Idelle Crouch, MD  PRIMARY REASON FOR VISIT:  1. Invasive carcinoma of breast ([place])   2. Family history of breast cancer   3. Family history of colon cancer      HISTORY OF PRESENT ILLNESS:   Gabrielle Davidson, a 71 y.o. female, was seen for a [facility] cancer genetics consultation at the request of Dr. Tasia Catchings due to a personal and family history of cancer.  Gabrielle Davidson presents to clinic today to discuss the possibility of a hereditary predisposition to cancer, genetic testing, and to further clarify her future cancer risks, as well as potential cancer risks for family members.   In 2022, at the age of 38, Gabrielle Davidson was diagnosed with invasive mammary carcinoma of the right breast, ER/PR+, HER2-. The treatment plan includes lumpectomy scheduled for 04/08/2021, possible chemotherapy, adjuvant radiation and adjuvant endocrine therapy.  Gabrielle Davidson also had thyroid cancer when she was in her early 14s, treated with surgery.   CANCER HISTORY:  Oncology History  Invasive carcinoma of breast ([place])  02/27/2021 Initial Diagnosis   Invasive carcinoma of breast ([facility])   02/27/2021 Cancer Staging   Staging form: Breast, AJCC 8th Edition - Clinical stage from 02/27/2021: Stage IA (cT1c, cN0, cM0, G2, ER+, PR+, HER2-) - Signed by Earlie Server, MD on 02/27/2021 Stage prefix: Initial diagnosis Histologic grading system: 3 grade system       RISK FACTORS:  Menarche was at age 57.  OCP use for approximately "long time" Ovaries intact: yes.  Hysterectomy: no.  Menopausal status: postmenopausal.  HRT use: several years Colonoscopy: yes;  reports few benign polyps .  Past Medical History:  Diagnosis Date   Breast cancer ([facility])    Family history of breast cancer    Family history of colon cancer    Hepatitis    A-YEARS AGO   Hypercholesteremia    Hypertension    Hypothyroidism     Osteopenia of multiple sites    Statin-induced myositis    Thyroid cancer [facility])     Past Surgical History:  Procedure Laterality Date   BREAST BIOPSY Right 02/10/2021   [place] bx, heart marker, path pending   BREAST BIOPSY Right 02/18/2021   stereo bx, distortion "COIL" clip-path pending   LUMBAR LAMINECTOMY/ DECOMPRESSION WITH MET-RX Right 09/16/2020   Procedure: Right Lumbar five Sacral one Minimally  invasive Discectomy with Metrex;  Surgeon: Judith Part, MD;  Location: [facility];  Service: Neurosurgery;  Laterality: Right;   NO PAST SURGERIES     THYROIDECTOMY Bilateral 03/26/2015   Procedure: THYROIDECTOMY;  Surgeon: Clyde Canterbury, MD;  Location: [facility];  Service: ENT;  Laterality: Bilateral;    Social History   Socioeconomic History   Marital status: Married    Spouse name: Not on file   Number of children: Not on file   Years of education: Not on file   Highest education level: Not on file  Occupational History   Not on file  Tobacco Use   Smoking status: Former    Packs/day: 1.00    Years: 20.00    Pack years: 20.00    Types: Cigarettes    Quit date: 03/21/1983    Years since quitting: 38.0   Smokeless tobacco: Never  Vaping Use   Vaping Use: Never used  Substance and Sexual Activity   Alcohol use: No    Comment: RARE   Drug use:  No   Sexual activity: Not Currently  Other Topics Concern   Not on file  Social History Narrative   Not on file   Social Determinants of Health   Financial Resource Strain: Not on file  Food Insecurity: Not on file  Transportation Needs: Not on file  Physical Activity: Not on file  Stress: Not on file  Social Connections: Not on file     FAMILY HISTORY:  We obtained a detailed, 4-generation family history.  Significant diagnoses are listed below: Family History  Problem Relation Age of Onset   Alzheimer's disease Mother    Hernia Mother    Heart disease Father    Colon cancer Maternal Aunt        d. under 73   Breast  cancer Paternal Aunt 33   Heart attack Maternal Grandfather    Diabetes Paternal Grandmother    Breast cancer Cousin 52   Gabrielle Davidson has 1 brother, 65, 2 sisters, 40 and 7. She has 2 nephews and 1 niece. None have had cancer.  Gabrielle Davidson's mother died at 55. Patient had approximately 7 maternal aunts/uncles. One aunt died of colon cancer under the age of 52, the cancer may have started somewhere else. A maternal cousin had breast cancer at 28. Grandfather died of heart issues, grandmother died of internal bleeding.  Gabrielle Davidson's father died at 61. Patient had several paternal aunts and 1 uncle. A paternal aunt had breast cancer, likely in her 35s, and died later in life. No known paternal cousins with cancer. Paternal grandmother died in her 44s-70s, grandfather died under 16.   Gabrielle Davidson is unaware of previous family history of genetic testing for hereditary cancer risks. Patient's maternal ancestors are of [place] descent, and paternal ancestors are of [place] descent. There is no reported Ashkenazi Jewish ancestry. There is no known consanguinity.    GENETIC COUNSELING ASSESSMENT: Gabrielle Davidson is a 71 y.o. female with a personal and family history of breast cancer which is somewhat suggestive of a hereditary cancer syndrome and predisposition to cancer. We, therefore, discussed and recommended the following at today's visit.   DISCUSSION: We discussed that approximately 10% of breast cancer is hereditary. Most cases of hereditary breast cancer are associated with BRCA1/BRCA2 genes, although there are other genes associated with hereditary cancer as well. Cancers and risks are gene specific.  We discussed that testing is beneficial for several reasons including surgical decision-making for breast cancer, knowing about other cancer risks, identifying potential screening and risk-reduction options that may be appropriate, and to understand if other family members could be at risk  for cancer and allow them to undergo genetic testing.   We reviewed the characteristics, features and inheritance patterns of hereditary cancer syndromes. We also discussed genetic testing, including the appropriate family members to test, the process of testing, insurance coverage and turn-around-time for results. We discussed the implications of a negative, positive and/or variant of uncertain significant result. In order to get genetic test results in a timely manner so that Gabrielle Davidson can use these genetic test results for surgical decisions, we recommended Gabrielle Davidson pursue genetic testing for the [employer] BRCAPlus panel. Once complete, we recommend Gabrielle Davidson pursue reflex genetic testing to the CancerNext-Expanded+RNA gene panel.   The CancerNext-Expanded + RNAinsight gene panel offered by [employer] and includes sequencing and rearrangement analysis for the following 77 genes: IP, ALK, APC*, ATM*, AXIN2, BAP1, BARD1, BLM, BMPR1A, BRCA1*, BRCA2*, BRIP1*, CDC73, CDH1*,CDK4, CDKN1B, CDKN2A, CHEK2*, CTNNA1, DICER1, FANCC,  FH, FLCN, GALNT12, KIF1B, LZTR1, MAX, MEN1, MET, MLH1*, MSH2*, MSH3, MSH6*, MUTYH*, NBN, NF1*, NF2, NTHL1, PALB2*, PHOX2B, PMS2*, POT1, PRKAR1A, PTCH1, PTEN*, RAD51C*, RAD51D*,RB1, RECQL, RET, SDHA, SDHAF2, SDHB, SDHC, SDHD, SMAD4, SMARCA4, SMARCB1, SMARCE1, STK11, SUFU, TMEM127, TP53*,TSC1, TSC2, VHL and XRCC2 (sequencing and deletion/duplication); EGFR, EGLN1, HOXB13, KIT, MITF, PDGFRA, POLD1 and POLE (sequencing only); EPCAM and GREM1 (deletion/duplication only).  Based on Ms. Kumpf's personal and family history of cancer, she meets medical criteria for genetic testing. Despite that she meets criteria, she may still have an out of pocket cost.    PLAN: After considering the risks, benefits, and limitations, Gabrielle Davidson provided informed consent to pursue genetic testing and the blood sample was sent to [facility] for analysis of the BRCAPlus +  CancerNext-Expanded+RNA panel. Results should be available within approximately 5-12 days' time, at which point they will be disclosed by telephone to Gabrielle Davidson, as will any additional recommendations warranted by these results. Gabrielle Davidson will receive a summary of her genetic counseling visit and a copy of her results once available. This information will also be available in Epic.   Ms. Prashad's questions were answered to her satisfaction today. Our contact information was provided should additional questions or concerns arise. Thank you for the referral and allowing [place] to share in the care of your patient.   Faith Rogue, MS, [facility] Genetic Counselor [email or web address] Phone: [phone]  The patient was seen for a total of 25 minutes in face-to-face genetic counseling.  Patient was seen alone.  Dr. Grayland Ormond was available for discussion regarding this case.   _______________________________________________________________________ For Office Staff:  Number of people involved in session: 1 Was an Intern/ student involved with case: no

## 2021-03-30 ENCOUNTER — Other Ambulatory Visit
Admission: RE | Admit: 2021-03-30 | Discharge: 2021-03-30 | Disposition: A | Discharge status: Home / Self Care | Payer: Medicare HMO | Source: Ambulatory Visit | Attending: General Surgery | Admitting: General Surgery

## 2021-03-30 ENCOUNTER — Other Ambulatory Visit: Payer: Self-pay

## 2021-03-30 ENCOUNTER — Telehealth: Payer: Self-pay

## 2021-03-30 DIAGNOSIS — Z01812 Encounter for preprocedural laboratory examination: Secondary | ICD-10-CM

## 2021-03-30 NOTE — Telephone Encounter (Signed)
Returned patients call. Instead of doing a partial mastectomy, she is moving forward with total mastectomy of the right and reduction on the left breast.

## 2021-03-30 NOTE — Telephone Encounter (Signed)
Patient called to say she has decided to have a mastectomy with reconstruction of the right breast.  Patient said she has questions about this and the reduction of her left breast.  Please call.

## 2021-03-30 NOTE — Patient Instructions (Addendum)
Your procedure is scheduled on: 04/08/21 - Wednesday Report to the Registration Desk on the 1st floor of the [facility]. To find out your arrival time, please call [phone] between 1PM - 3PM on: 04/07/21 - Tuesday Report [place] for Labs/EKG on 03/31/21 at 1 pm.  REMEMBER: Instructions that are not followed completely may result in serious medical risk, up to and including death; or upon the discretion of your surgeon and anesthesiologist your surgery may need to be rescheduled.  Do not eat food after midnight the night before surgery.  No gum chewing, lozengers or hard candies.  You may however, drink CLEAR liquids up to 2 hours before you are scheduled to arrive for your surgery. Do not drink anything within 2 hours of your scheduled arrival time.  Clear liquids include: - water  - apple juice without pulp - gatorade (not RED, PURPLE, OR BLUE) - black coffee or tea (Do NOT add milk or creamers to the coffee or tea) Do NOT drink anything that is not on this list.  TAKE THESE MEDICATIONS THE MORNING OF SURGERY WITH A SIP OF WATER:  - FLUoxetine (PROZAC) 40 MG capsule - gabapentin (NEURONTIN) 300 MG capsule - levothyroxine (SYNTHROID) 88 MCG tablet   One week prior to surgery: Stop Anti-inflammatories (NSAIDS) such as Advil, Aleve, Ibuprofen, Motrin, Naproxen, Naprosyn and Aspirin based products such as Excedrin, Goodys Powder, BC Powder.  Stop ANY OVER THE COUNTER supplements until after surgery.  You may however, continue to take Tylenol if needed for pain up until the day of surgery.  No Alcohol for 24 hours before or after surgery.  No Smoking including e-cigarettes for 24 hours prior to surgery.  No chewable tobacco products for at least 6 hours prior to surgery.  No nicotine patches on the day of surgery.  Do not use any "recreational" drugs for at least a week prior to your surgery.  Please be advised that the combination of cocaine and anesthesia may  have negative outcomes, up to and including death. If you test positive for cocaine, your surgery will be cancelled.  On the morning of surgery brush your teeth with toothpaste and water, you may rinse your mouth with mouthwash if you wish. Do not swallow any toothpaste or mouthwash.  Use CHG Soap or wipes as directed on instruction sheet.  Do not wear jewelry, make-up, hairpins, clips or nail polish.  Do not wear lotions, powders, or perfumes.   Do not shave body from the neck down 48 hours prior to surgery just in case you cut yourself which could leave a site for infection.  Also, freshly shaved skin may become irritated if using the CHG soap.  Contact lenses, hearing aids and dentures may not be worn into surgery.  Do not bring valuables to the hospital. [facility] is not responsible for any missing/lost belongings or valuables.   Notify your doctor if there is any change in your medical condition (cold, fever, infection).  Wear comfortable clothing (specific to your surgery type) to the hospital.  After surgery, you can help prevent lung complications by doing breathing exercises.  Take deep breaths and cough every 1-2 hours. Your doctor may order a device called an Incentive Spirometer to help you take deep breaths. When coughing or sneezing, hold a pillow firmly against your incision with both hands. This is called splinting. Doing this helps protect your incision. It also decreases belly discomfort.  If you are being admitted to the hospital overnight,  leave your suitcase in the car. After surgery it may be brought to your room.  If you are being discharged the day of surgery, you will not be allowed to drive home. You will need a responsible adult (18 years or older) to drive you home and stay with you that night.   If you are taking public transportation, you will need to have a responsible adult (18 years or older) with you. Please confirm with your physician that it  is acceptable to use public transportation.   Please call the [facility] at [phone] if you have any questions about these instructions.  Surgery Visitation Policy:  Patients undergoing a surgery or procedure may have one family member or support person with them as long as that person is not COVID-19 positive or experiencing its symptoms.  That person may remain in the waiting area during the procedure and may rotate out with other people.  Inpatient Visitation:    Visiting hours are 7 a.m. to 8 p.m. Up to two visitors ages 16+ are allowed at one time in a patient room. The visitors may rotate out with other people during the day. Visitors must check out when they leave, or other visitors will not be allowed. One designated support person may remain overnight. The visitor must pass COVID-19 screenings, use hand sanitizer when entering and exiting the patients room and wear a mask at all times, including in the patients room. Patients must also wear a mask when staff or their visitor are in the room. Masking is required regardless of vaccination status.

## 2021-03-31 ENCOUNTER — Encounter: Payer: Self-pay | Admitting: Plastic Surgery

## 2021-03-31 ENCOUNTER — Other Ambulatory Visit
Admission: RE | Admit: 2021-03-31 | Discharge: 2021-03-31 | Disposition: A | Discharge status: Home / Self Care | Payer: Medicare HMO | Source: Ambulatory Visit | Attending: General Surgery | Admitting: General Surgery

## 2021-03-31 DIAGNOSIS — Z01818 Encounter for other preprocedural examination: Secondary | ICD-10-CM | POA: Diagnosis not present

## 2021-03-31 DIAGNOSIS — Z01812 Encounter for preprocedural laboratory examination: Secondary | ICD-10-CM

## 2021-03-31 LAB — BASIC METABOLIC PANEL
Anion gap: 9 (ref 5–15)
BUN: 19 mg/dL (ref 8–23)
CO2: 30 mmol/L (ref 22–32)
Calcium: 10.5 mg/dL — ABNORMAL HIGH (ref 8.9–10.3)
Chloride: 101 mmol/L (ref 98–111)
Creatinine, Ser: 0.64 mg/dL (ref 0.44–1.00)
GFR, Estimated: >60 mL/min (ref >60)
Glucose, Bld: 88 mg/dL (ref 70–99)
Potassium: 2.9 mmol/L — ABNORMAL LOW (ref 3.5–5.1)
Sodium: 140 mmol/L (ref 135–145)

## 2021-03-31 NOTE — Telephone Encounter (Signed)
Spoke with patient, we are going to keep current surgical plan

## 2021-04-01 ENCOUNTER — Telehealth: Payer: Self-pay | Admitting: Licensed Clinical Social Worker

## 2021-04-01 ENCOUNTER — Telehealth: Payer: Medicare HMO | Admitting: Plastic Surgery

## 2021-04-01 ENCOUNTER — Telehealth: Payer: Self-pay

## 2021-04-01 ENCOUNTER — Encounter: Payer: Self-pay | Admitting: Plastic Surgery

## 2021-04-01 NOTE — Telephone Encounter (Signed)
Patient is having surgery on 04/08/21. Surgery has been changed from lumpectomy to mastectomy on right side.  Patient would like to have Dr. Eusebio Friendly opinion about having a double mastectomy.  She said she is wondering if this would be easier for reconstruction.  Please call.

## 2021-04-01 NOTE — Telephone Encounter (Signed)
Disclosed negative genetic testing on the [employer] panel which includes 8 genes with the highest risk for breast cancer that individuals with breast cancer may use to help make a surgical decision. The remainder of testing is pending and we will call when it is ready.

## 2021-04-06 ENCOUNTER — Encounter: Payer: Self-pay | Admitting: Plastic Surgery

## 2021-04-06 ENCOUNTER — Other Ambulatory Visit: Payer: Self-pay

## 2021-04-06 ENCOUNTER — Telehealth (INDEPENDENT_AMBULATORY_CARE_PROVIDER_SITE_OTHER): Payer: Medicare HMO | Admitting: Plastic Surgery

## 2021-04-06 DIAGNOSIS — C50919 Malignant neoplasm of unspecified site of unspecified female breast: Secondary | ICD-10-CM

## 2021-04-06 NOTE — Progress Notes (Addendum)
° °  Subjective:    Patient ID: Gabrielle Davidson, female    DOB: 08-19-1950, 71 y.o.   MRN: [id number]  The patient is a 71 yrs old female joining me on phone for further discussion about breast reconstruction on the phone.  The patient was diagnosed with right breast cancer of the upper inner quadrant in November.  She was originally interested in breast conserving therapy.  But has since changed her mind because she does not want to get radiation.  She is 4 feet 11 inches tall and weighs 144 pounds.  She had breast liposuction 20 years ago by a dermatologist in [facility].  She thinks that she is a DDD cup size and would like to be a C cup size.  She is not a smoker, does not have diabetes, is not on a blood thinner but does have a past medical history.  This includes thyroid cancer, hyperlipidemia, hypertension and adjustment disorder.     Review of Systems  Constitutional: Negative.   HENT: Negative.    Eyes: Negative.   Respiratory: Negative.    Cardiovascular: Negative.   Gastrointestinal: Negative.   Endocrine: Negative.   Genitourinary: Negative.   Musculoskeletal: Negative.   Skin: Negative.       Objective:   Physical Exam      Assessment & Plan:     ICD-10-CM   1. Invasive carcinoma of breast ([place])  C50.919       The patient has changed her mind and wants to have a right mastectomy with no radiation.  She would like to have immediate breast reconstruction with expander and Flex HD.  She understands that the left breast could still undergo a mastectomy later for a reduction.  Her surgery is scheduled for Wednesday.  I connected with  Roylene R Edmister on 04/06/21 by phone and verified that I am speaking with the correct person using two identifiers.  I spent 25 minutes in the following manner: Discussion with the patient, review of options with the patient, review of chart, discussion with Dr. Bary Castilla and documentation.  I also needed to implants and note by the scheduler  of the change of operative plan.  The patient was at home and I was at the office.   I discussed the limitations of evaluation and management by telemedicine. The patient expressed understanding and agreed to proceed.

## 2021-04-06 NOTE — H&P (View-Only) (Signed)
° °  Subjective:    Patient ID: Gabrielle Davidson, female    DOB: August 23, 1950, 71 y.o.   MRN: [id number]  The patient is a 71 yrs old female joining me on phone for further discussion about breast reconstruction on the phone.  The patient was diagnosed with right breast cancer of the upper inner quadrant in November.  She was originally interested in breast conserving therapy.  But has since changed her mind because she does not want to get radiation.  She is 4 feet 11 inches tall and weighs 144 pounds.  She had breast liposuction 20 years ago by a dermatologist in [facility].  She thinks that she is a DDD cup size and would like to be a C cup size.  She is not a smoker, does not have diabetes, is not on a blood thinner but does have a past medical history.  This includes thyroid cancer, hyperlipidemia, hypertension and adjustment disorder.     Review of Systems  Constitutional: Negative.   HENT: Negative.    Eyes: Negative.   Respiratory: Negative.    Cardiovascular: Negative.   Gastrointestinal: Negative.   Endocrine: Negative.   Genitourinary: Negative.   Musculoskeletal: Negative.   Skin: Negative.       Objective:   Physical Exam      Assessment & Plan:     ICD-10-CM   1. Invasive carcinoma of breast ([place])  C50.919       The patient has changed her mind and wants to have a right mastectomy with no radiation.  She would like to have immediate breast reconstruction with expander and Flex HD.  She understands that the left breast could still undergo a mastectomy later for a reduction.  Her surgery is scheduled for Wednesday.  I connected with  Lashonta R Fullam on 04/06/21 by phone and verified that I am speaking with the correct person using two identifiers.  I spent 25 minutes in the following manner: Discussion with the patient, review of options with the patient, review of chart, discussion with Dr. Bary Castilla and documentation.  I also needed to implants and note by the scheduler  of the change of operative plan.  The patient was at home and I was at the office.   I discussed the limitations of evaluation and management by telemedicine. The patient expressed understanding and agreed to proceed.

## 2021-04-07 ENCOUNTER — Telehealth: Payer: Self-pay | Admitting: Licensed Clinical Social Worker

## 2021-04-08 ENCOUNTER — Ambulatory Visit
Admission: RE | Admit: 2021-04-08 | Discharge: 2021-04-08 | Disposition: A | Discharge status: Home / Self Care | Payer: Medicare HMO | Attending: General Surgery | Admitting: General Surgery

## 2021-04-08 ENCOUNTER — Other Ambulatory Visit: Payer: Self-pay

## 2021-04-08 ENCOUNTER — Ambulatory Visit: Payer: Medicare HMO | Admitting: Anesthesiology

## 2021-04-08 ENCOUNTER — Ambulatory Visit: Payer: Medicare HMO

## 2021-04-08 ENCOUNTER — Encounter
Admission: RE | Disposition: A | Discharge status: Home / Self Care | Payer: Self-pay | Source: Home / Self Care | Attending: General Surgery

## 2021-04-08 ENCOUNTER — Ambulatory Visit
Admission: RE | Admit: 2021-04-08 | Discharge: 2021-04-08 | Disposition: A | Discharge status: Home / Self Care | Payer: Medicare HMO | Source: Ambulatory Visit | Attending: General Surgery | Admitting: General Surgery

## 2021-04-08 ENCOUNTER — Encounter: Payer: Self-pay | Admitting: General Surgery

## 2021-04-08 ENCOUNTER — Ambulatory Visit: Admission: RE | Admit: 2021-04-08 | Payer: Medicare HMO | Source: Ambulatory Visit

## 2021-04-08 DIAGNOSIS — Z87891 Personal history of nicotine dependence: Secondary | ICD-10-CM | POA: Diagnosis not present

## 2021-04-08 DIAGNOSIS — E039 Hypothyroidism, unspecified: Secondary | ICD-10-CM | POA: Diagnosis not present

## 2021-04-08 DIAGNOSIS — Z170 Estrogen receptor positive status [ER+]: Secondary | ICD-10-CM | POA: Diagnosis not present

## 2021-04-08 DIAGNOSIS — I10 Essential (primary) hypertension: Secondary | ICD-10-CM | POA: Diagnosis not present

## 2021-04-08 DIAGNOSIS — C50211 Malignant neoplasm of upper-inner quadrant of right female breast: Secondary | ICD-10-CM | POA: Diagnosis not present

## 2021-04-08 DIAGNOSIS — C50911 Malignant neoplasm of unspecified site of right female breast: Secondary | ICD-10-CM | POA: Insufficient documentation

## 2021-04-08 DIAGNOSIS — C50411 Malignant neoplasm of upper-outer quadrant of right female breast: Secondary | ICD-10-CM

## 2021-04-08 HISTORY — PX: BREAST RECONSTRUCTION WITH PLACEMENT OF TISSUE EXPANDER AND FLEX HD (ACELLULAR HYDRATED DERMIS): SHX6295

## 2021-04-08 HISTORY — PX: MASTECTOMY WITH AXILLARY LYMPH NODE DISSECTION: SHX5661

## 2021-04-08 SURGERY — MASTECTOMY WITH AXILLARY LYMPH NODE DISSECTION
Anesthesia: General | Laterality: Right

## 2021-04-08 MED ORDER — DEXMEDETOMIDINE (PRECEDEX) IN NS 20 MCG/5ML (4 MCG/ML) IV SYRINGE
PREFILLED_SYRINGE | INTRAVENOUS | Status: DC | PRN
  Administered 2021-04-08: 4 ug via INTRAVENOUS

## 2021-04-08 MED ORDER — DIAZEPAM 2 MG PO TABS: 2.0000 mg | ORAL_TABLET | Freq: Three times a day (TID) | ORAL | 0 refills | Status: DC | PRN

## 2021-04-08 MED ORDER — ACETAMINOPHEN 325 MG PO TABS: 650.0000 mg | ORAL_TABLET | ORAL | Status: DC | PRN

## 2021-04-08 MED ORDER — CHLORHEXIDINE GLUCONATE CLOTH 2 % EX PADS: 6.0000 | MEDICATED_PAD | Freq: Once | CUTANEOUS | Status: DC

## 2021-04-08 MED ORDER — ONDANSETRON HCL 4 MG/2ML IJ SOLN: 4.0000 mg | Freq: Once | INTRAMUSCULAR | Status: DC | PRN

## 2021-04-08 MED ORDER — CEFAZOLIN SODIUM-DEXTROSE 2-4 GM/100ML-% IV SOLN: 2.0000 g | INTRAVENOUS | Status: DC

## 2021-04-08 MED ORDER — SEVOFLURANE IN SOLN
RESPIRATORY_TRACT | Status: AC
  Filled 2021-04-08: qty 250

## 2021-04-08 MED ORDER — SODIUM CHLORIDE 0.9% FLUSH: 3.0000 mL | INTRAVENOUS | Status: DC | PRN

## 2021-04-08 MED ORDER — ONDANSETRON HCL 4 MG/2ML IJ SOLN
INTRAMUSCULAR | Status: DC | PRN
  Administered 2021-04-08 (×2): 4 mg via INTRAVENOUS

## 2021-04-08 MED ORDER — SODIUM CHLORIDE 0.9% FLUSH: 3.0000 mL | Freq: Two times a day (BID) | INTRAVENOUS | Status: DC

## 2021-04-08 MED ORDER — ACETAMINOPHEN 650 MG RE SUPP: 650.0000 mg | RECTAL | Status: DC | PRN

## 2021-04-08 MED ORDER — PHENYLEPHRINE HCL-NACL 20-0.9 MG/250ML-% IV SOLN
INTRAVENOUS | Status: DC | PRN
  Administered 2021-04-08: 25 ug/min via INTRAVENOUS

## 2021-04-08 MED ORDER — STERILE WATER FOR IRRIGATION IR SOLN: Status: DC | PRN

## 2021-04-08 MED ORDER — GLYCOPYRROLATE 0.2 MG/ML IJ SOLN
INTRAMUSCULAR | Status: DC | PRN
  Administered 2021-04-08: .2 mg via INTRAVENOUS

## 2021-04-08 MED ORDER — BUPIVACAINE-EPINEPHRINE 0.5% -1:200000 IJ SOLN
INTRAMUSCULAR | Status: DC | PRN
  Administered 2021-04-08: 20 mL

## 2021-04-08 MED ORDER — TECHNETIUM TC 99M TILMANOCEPT KIT
1.0000 | PACK | Freq: Once | INTRAVENOUS | Status: AC | PRN
  Administered 2021-04-08: 1.1 via INTRADERMAL

## 2021-04-08 MED ORDER — HYDROMORPHONE HCL 1 MG/ML IJ SOLN
INTRAMUSCULAR | Status: AC
  Filled 2021-04-08: qty 1

## 2021-04-08 MED ORDER — OXYCODONE HCL 5 MG/5ML PO SOLN: 5.0000 mg | Freq: Once | ORAL | Status: AC | PRN

## 2021-04-08 MED ORDER — LACTATED RINGERS IV SOLN: INTRAVENOUS | Status: DC

## 2021-04-08 MED ORDER — CHLORHEXIDINE GLUCONATE 0.12 % MT SOLN: 15.0000 mL | Freq: Once | OROMUCOSAL | Status: AC

## 2021-04-08 MED ORDER — SODIUM CHLORIDE 0.9 % IR SOLN
Status: DC | PRN
  Administered 2021-04-08: 14:00:00 200 mL

## 2021-04-08 MED ORDER — SUGAMMADEX SODIUM 200 MG/2ML IV SOLN
INTRAVENOUS | Status: DC | PRN
Start: 2021-04-08 — End: 2021-04-08
  Administered 2021-04-08: 200 mg via INTRAVENOUS

## 2021-04-08 MED ORDER — OXYCODONE HCL 5 MG PO TABS: 5.0000 mg | ORAL_TABLET | ORAL | Status: DC | PRN

## 2021-04-08 MED ORDER — MIDAZOLAM HCL 2 MG/2ML IJ SOLN
INTRAMUSCULAR | Status: AC
  Filled 2021-04-08: qty 2

## 2021-04-08 MED ORDER — SUCCINYLCHOLINE CHLORIDE 200 MG/10ML IV SOSY
PREFILLED_SYRINGE | INTRAVENOUS | Status: DC | PRN
  Administered 2021-04-08: 80 mg via INTRAVENOUS

## 2021-04-08 MED ORDER — HYDROMORPHONE HCL 1 MG/ML IJ SOLN
INTRAMUSCULAR | Status: DC | PRN
  Administered 2021-04-08 (×2): .5 mg via INTRAVENOUS

## 2021-04-08 MED ORDER — ROCURONIUM BROMIDE 100 MG/10ML IV SOLN
INTRAVENOUS | Status: DC | PRN
  Administered 2021-04-08: 10 mg via INTRAVENOUS
  Administered 2021-04-08: 40 mg via INTRAVENOUS
  Administered 2021-04-08: 20 mg via INTRAVENOUS

## 2021-04-08 MED ORDER — METHYLENE BLUE 0.5 % INJ SOLN
INTRAVENOUS | Status: DC | PRN
  Administered 2021-04-08: 5 mL

## 2021-04-08 MED ORDER — BUPIVACAINE-EPINEPHRINE (PF) 0.5% -1:200000 IJ SOLN
INTRAMUSCULAR | Status: AC
  Filled 2021-04-08: qty 30

## 2021-04-08 MED ORDER — DEXAMETHASONE SODIUM PHOSPHATE 10 MG/ML IJ SOLN
INTRAMUSCULAR | Status: DC | PRN
  Administered 2021-04-08: 10 mg via INTRAVENOUS

## 2021-04-08 MED ORDER — ACETAMINOPHEN 10 MG/ML IV SOLN: 1000.0000 mg | Freq: Once | INTRAVENOUS | Status: DC | PRN

## 2021-04-08 MED ORDER — BUPIVACAINE-EPINEPHRINE (PF) 0.25% -1:200000 IJ SOLN
INTRAMUSCULAR | Status: AC
  Filled 2021-04-08: qty 30

## 2021-04-08 MED ORDER — METHYLENE BLUE 0.5 % INJ SOLN
INTRAVENOUS | Status: AC
  Filled 2021-04-08: qty 10

## 2021-04-08 MED ORDER — FAMOTIDINE 20 MG PO TABS: 20.0000 mg | ORAL_TABLET | Freq: Once | ORAL | Status: AC

## 2021-04-08 MED ORDER — CEFAZOLIN SODIUM-DEXTROSE 2-4 GM/100ML-% IV SOLN
INTRAVENOUS | Status: AC
  Filled 2021-04-08: qty 100

## 2021-04-08 MED ORDER — FENTANYL CITRATE (PF) 100 MCG/2ML IJ SOLN
INTRAMUSCULAR | Status: AC
  Filled 2021-04-08: qty 2

## 2021-04-08 MED ORDER — FAMOTIDINE 20 MG PO TABS
ORAL_TABLET | ORAL | Status: AC
  Administered 2021-04-08: 10:00:00 20 mg via ORAL
  Filled 2021-04-08: qty 1

## 2021-04-08 MED ORDER — ACETAMINOPHEN 10 MG/ML IV SOLN
INTRAVENOUS | Status: DC | PRN
Start: 2021-04-08 — End: 2021-04-08
  Administered 2021-04-08: 1000 mg via INTRAVENOUS

## 2021-04-08 MED ORDER — SODIUM CHLORIDE 0.9 % IV SOLN: 250.0000 mL | INTRAVENOUS | Status: DC | PRN

## 2021-04-08 MED ORDER — LIDOCAINE HCL (CARDIAC) PF 100 MG/5ML IV SOSY
PREFILLED_SYRINGE | INTRAVENOUS | Status: DC | PRN
Start: 2021-04-08 — End: 2021-04-08
  Administered 2021-04-08: 80 mg via INTRAVENOUS

## 2021-04-08 MED ORDER — FENTANYL CITRATE (PF) 100 MCG/2ML IJ SOLN
25.0000 ug | INTRAMUSCULAR | Status: DC | PRN
  Administered 2021-04-08: 15:00:00 25 ug via INTRAVENOUS

## 2021-04-08 MED ORDER — EPHEDRINE SULFATE (PRESSORS) 50 MG/ML IJ SOLN
INTRAMUSCULAR | Status: DC | PRN
Start: 2021-04-08 — End: 2021-04-08
  Administered 2021-04-08 (×4): 5 mg via INTRAVENOUS

## 2021-04-08 MED ORDER — PROPOFOL 10 MG/ML IV BOLUS
INTRAVENOUS | Status: DC | PRN
  Administered 2021-04-08: 170 mg via INTRAVENOUS

## 2021-04-08 MED ORDER — CEFAZOLIN SODIUM-DEXTROSE 2-4 GM/100ML-% IV SOLN
2.0000 g | INTRAVENOUS | Status: AC
  Administered 2021-04-08: 12:00:00 2 g via INTRAVENOUS

## 2021-04-08 MED ORDER — STERILE WATER FOR IRRIGATION IR SOLN
Status: DC | PRN
  Administered 2021-04-08: 200 mL

## 2021-04-08 MED ORDER — FENTANYL CITRATE (PF) 100 MCG/2ML IJ SOLN
INTRAMUSCULAR | Status: DC | PRN
  Administered 2021-04-08: 50 ug via INTRAVENOUS

## 2021-04-08 MED ORDER — OXYCODONE HCL 5 MG PO TABS
5.0000 mg | ORAL_TABLET | Freq: Once | ORAL | Status: AC | PRN
Start: 2021-04-08 — End: 2021-04-08
  Administered 2021-04-08: 15:00:00 5 mg via ORAL

## 2021-04-08 MED ORDER — ACETAMINOPHEN 10 MG/ML IV SOLN
INTRAVENOUS | Status: AC
  Filled 2021-04-08: qty 100

## 2021-04-08 MED ORDER — CHLORHEXIDINE GLUCONATE 0.12 % MT SOLN
OROMUCOSAL | Status: AC
  Administered 2021-04-08: 10:00:00 15 mL via OROMUCOSAL
  Filled 2021-04-08: qty 15

## 2021-04-08 MED ORDER — ORAL CARE MOUTH RINSE: 15.0000 mL | Freq: Once | OROMUCOSAL | Status: AC

## 2021-04-08 MED ORDER — MIDAZOLAM HCL 2 MG/2ML IJ SOLN
INTRAMUSCULAR | Status: DC | PRN
Start: 2021-04-08 — End: 2021-04-08
  Administered 2021-04-08: 2 mg via INTRAVENOUS

## 2021-04-08 MED ORDER — OXYCODONE HCL 5 MG PO TABS
ORAL_TABLET | ORAL | Status: AC
  Filled 2021-04-08: qty 1

## 2021-04-08 MED ORDER — FENTANYL CITRATE (PF) 100 MCG/2ML IJ SOLN: 25.0000 ug | INTRAMUSCULAR | Status: DC | PRN

## 2021-04-08 SURGICAL SUPPLY — 73 items
BAG DECANTER FOR FLEXI CONT (MISCELLANEOUS) ×2 IMPLANT
BINDER BREAST XLRG (GAUZE/BANDAGES/DRESSINGS) ×1 IMPLANT
BIOPATCH WHT 1IN DISK W/4.0 H (GAUZE/BANDAGES/DRESSINGS) ×1 IMPLANT
BLADE BOVIE TIP EXT 4 (BLADE) ×2 IMPLANT
BLADE PHOTON ILLUMINATED (MISCELLANEOUS) ×1 IMPLANT
BLADE SURG 15 STRL LF DISP TIS (BLADE) ×1 IMPLANT
BLADE SURG 15 STRL SS (BLADE) ×1
BLADE SURG 15 STRL SS SAFETY (BLADE) ×2 IMPLANT
BNDG GAUZE ELAST 4 BULKY (GAUZE/BANDAGES/DRESSINGS) ×3 IMPLANT
BULB RESERV EVAC DRAIN JP 100C (MISCELLANEOUS) ×1 IMPLANT
CHLORAPREP W/TINT 26 (MISCELLANEOUS) ×2 IMPLANT
CNTNR SPEC 2.5X3XGRAD LEK (MISCELLANEOUS) ×1
CONT SPEC 4OZ STER OR WHT (MISCELLANEOUS) ×1
CONT SPEC 4OZ STRL OR WHT (MISCELLANEOUS) ×1
CONTAINER SPEC 2.5X3XGRAD LEK (MISCELLANEOUS) ×3 IMPLANT
DERMABOND ADVANCED (GAUZE/BANDAGES/DRESSINGS) ×1
DERMABOND ADVANCED .7 DNX12 (GAUZE/BANDAGES/DRESSINGS) ×2 IMPLANT
DRAIN CHANNEL JP 19F (MISCELLANEOUS) ×1 IMPLANT
DRAPE LAPAROTOMY TRNSV 106X77 (MISCELLANEOUS) ×2 IMPLANT
DRSG OPSITE POSTOP 4X10 (GAUZE/BANDAGES/DRESSINGS) ×1 IMPLANT
ELECT CAUTERY BLADE 6.4 (BLADE) ×2 IMPLANT
ELECT CAUTERY BLADE TIP 2.5 (TIP) ×4
ELECT REM PT RETURN 9FT ADLT (ELECTROSURGICAL) ×2
ELECTRODE CAUTERY BLDE TIP 2.5 (TIP) ×2 IMPLANT
ELECTRODE REM PT RTRN 9FT ADLT (ELECTROSURGICAL) ×1 IMPLANT
GLOVE SURG ENC MOIS LTX SZ6.5 (GLOVE) ×6 IMPLANT
GLOVE SURG ENC MOIS LTX SZ7.5 (GLOVE) ×2 IMPLANT
GLOVE SURG UNDER LTX SZ8 (GLOVE) ×2 IMPLANT
GOWN STRL REUS W/ TWL LRG LVL3 (GOWN DISPOSABLE) ×6 IMPLANT
GOWN STRL REUS W/TWL LRG LVL3 (GOWN DISPOSABLE) ×10
GRAFT FLEX HD 6X16 PLIABLE (Tissue) ×1 IMPLANT
ILLUMINATOR WAVEGUIDE N/F (MISCELLANEOUS) ×1 IMPLANT
IMPL EXPANDER BREAST 535CC (Breast) IMPLANT
IMPLANT BREAST 535CC (Breast) ×1 IMPLANT
IMPLANT EXPANDER BREAST 535CC (Breast) ×1 IMPLANT
KIT FILL ASEPTIC TRANSFER (MISCELLANEOUS) ×2 IMPLANT
LABEL OR SOLS (LABEL) ×2 IMPLANT
MANIFOLD NEPTUNE II (INSTRUMENTS) ×3 IMPLANT
NDL SPNL 20GX3.5 QUINCKE YW (NEEDLE) IMPLANT
NEEDLE SPNL 20GX3.5 QUINCKE YW (NEEDLE) ×2 IMPLANT
PACK BASIN MAJOR ARMC (MISCELLANEOUS) ×2 IMPLANT
PACK BASIN MINOR ARMC (MISCELLANEOUS) ×2 IMPLANT
PAD ABD DERMACEA PRESS 5X9 (GAUZE/BANDAGES/DRESSINGS) ×6 IMPLANT
SLEVE PROBE SENORX GAMMA FIND (MISCELLANEOUS) ×2 IMPLANT
SOL PREP PVP 2OZ (MISCELLANEOUS) ×4
SOLUTION PREP PVP 2OZ (MISCELLANEOUS) ×2 IMPLANT
SPONGE T-LAP 18X18 ~~LOC~~+RFID (SPONGE) ×9 IMPLANT
STRIP CLOSURE SKIN 1/2X4 (GAUZE/BANDAGES/DRESSINGS) ×2 IMPLANT
SUT MNCRL 3-0 UNDYED SH (SUTURE) ×2 IMPLANT
SUT MNCRL 4-0 (SUTURE) ×3
SUT MNCRL 4-0 27XMFL (SUTURE) ×3
SUT MNCRL+ 5-0 UNDYED PC-3 (SUTURE) ×2 IMPLANT
SUT MONOCRYL 3-0 UNDYED (SUTURE) ×3
SUT MONOCRYL 5-0 (SUTURE) ×2
SUT PDS 3-0 (SUTURE) ×2
SUT PDS PLUS 2 (SUTURE) ×12
SUT PDS PLUS AB 2-0 CT-1 (SUTURE) ×6 IMPLANT
SUT PDS PLUS AB 3-0 PC-5 (SUTURE) IMPLANT
SUT SILK 2 0 (SUTURE) ×1
SUT SILK 2-0 30XBRD TIE 12 (SUTURE) ×1 IMPLANT
SUT SILK 3 0 (SUTURE) ×2
SUT SILK 3 0 SH 30 (SUTURE) ×1 IMPLANT
SUT SILK 3-0 18XBRD TIE 12 (SUTURE) ×1 IMPLANT
SUT SILK 4 0 SH (SUTURE) ×3 IMPLANT
SUT VIC AB 3-0 SH 27 (SUTURE) ×4
SUT VIC AB 3-0 SH 27X BRD (SUTURE) ×1 IMPLANT
SUTURE MNCRL 4-0 27XMF (SUTURE) ×2 IMPLANT
SYR 10ML LL (SYRINGE) ×4 IMPLANT
SYR 20ML LL LF (SYRINGE) ×1 IMPLANT
SYR BULB IRRIG 60ML STRL (SYRINGE) ×2 IMPLANT
TAPE TRANSPORE STRL 2 31045 (GAUZE/BANDAGES/DRESSINGS) ×2 IMPLANT
TOWEL OR 17X26 4PK STRL BLUE (TOWEL DISPOSABLE) ×2 IMPLANT
WATER STERILE IRR 1000ML POUR (IV SOLUTION) ×1 IMPLANT

## 2021-04-08 NOTE — Transfer of Care (Signed)
Immediate Anesthesia Transfer of Care Note  Patient: Gabrielle Davidson  Procedure(s) Performed: MASTECTOMY WITH AXILLARY LYMPH NODE DISSECTION (Right) RIGHT BREAST RECONSTRUCTION WITH PLACEMENT OF TISSUE EXPANDER AND FLEX HD (ACELLULAR HYDRATED DERMIS) (Right)  Patient Location: PACU  Anesthesia Type:General  Level of Consciousness: awake, drowsy and patient cooperative  Airway & Oxygen Therapy: Patient Spontanous Breathing and Patient connected to face mask oxygen  Post-op Assessment: Report given to RN and Post -op Vital signs reviewed and stable  Post vital signs: Reviewed and stable  Last Vitals:  Vitals Value Taken Time  BP 131/63 (82)   Temp    Pulse 82 04/08/21 1437  Resp 14   SpO2 100 % 04/08/21 1437  Vitals shown include unvalidated device data.  Last Pain:  Vitals:   04/08/21 0933  TempSrc: Temporal  PainSc: 0-No pain         Complications: No notable events documented.

## 2021-04-08 NOTE — Anesthesia Postprocedure Evaluation (Signed)
Anesthesia Post Note  Patient: Gabrielle Davidson  Procedure(s) Performed: MASTECTOMY WITH AXILLARY LYMPH NODE DISSECTION (Right) RIGHT BREAST RECONSTRUCTION WITH PLACEMENT OF TISSUE EXPANDER AND FLEX HD (ACELLULAR HYDRATED DERMIS) (Right)  Patient location during evaluation: PACU Anesthesia Type: General Level of consciousness: awake and alert, oriented and patient cooperative Pain management: pain level controlled Vital Signs Assessment: post-procedure vital signs reviewed and stable Respiratory status: spontaneous breathing, nonlabored ventilation and respiratory function stable Cardiovascular status: blood pressure returned to baseline and stable Postop Assessment: adequate PO intake Anesthetic complications: no   No notable events documented.   Last Vitals:  Vitals:   04/08/21 1500 04/08/21 1515  BP: 98/78 119/72  Pulse: 79 80  Resp: 12 16  Temp: 36.4 C 36.7 C  SpO2: 98% 98%    Last Pain:  Vitals:   04/08/21 1515  TempSrc:   PainSc: [place]

## 2021-04-08 NOTE — H&P (Signed)
Gabrielle Davidson [id number] July 05, 1950     HPI: 71 y/o woman with an invasive mammary carcinoma of the right breast.  Original plan was for resection and contralateral mastopexy.  On review of results without radiation therapy, which she reported she did not want to consider, and has decided to undergo right mastectomy with planned implant reconstruction.  The contralateral breast will be reduced at the time of exchange of the expander for the permanent implant.   Medications Prior to Admission  Medication Sig Dispense Refill Last Dose   amitriptyline (ELAVIL) 25 MG tablet Take 25 mg by mouth at bedtime.   04/07/2021   Ascorbic Acid (VITAMIN C) 1000 MG tablet Take 1,000 mg by mouth in the morning and at bedtime.   04/05/2021   aspirin EC 81 MG tablet Take 1 tablet (81 mg total) by mouth at bedtime. Restart 09/23/20 30 tablet 11 04/04/2021   Biotin 10000 MCG TABS Take 10,000 mcg by mouth in the morning and at bedtime.   04/07/2021   Calcium Carbonate-Vitamin D3 600-400 MG-UNIT TABS Take 1-2 tablets by mouth See admin instructions. 1 tablet in the morning and 2 tablets at bedtime   04/07/2021   chlorthalidone (HYGROTON) 25 MG tablet Take 25 mg by mouth in the morning.   04/07/2021   FLUoxetine (PROZAC) 40 MG capsule Take 40 mg by mouth every morning.    04/08/2021   gabapentin (NEURONTIN) 300 MG capsule Take 300 mg by mouth in the morning and at bedtime.   04/08/2021   hydrOXYzine (ATARAX) 10 MG tablet Take 10 mg by mouth 3 (three) times daily as needed for itching.   prn   ibuprofen (ADVIL) 200 MG tablet Take 400-800 mg by mouth every 8 (eight) hours as needed (PAIN.).   04/04/2021   levothyroxine (SYNTHROID) 88 MCG tablet Take 88 mcg by mouth daily before breakfast.   04/07/2021   losartan (COZAAR) 100 MG tablet Take 100 mg by mouth in the morning.   04/07/2021   meclizine (ANTIVERT) 25 MG tablet Take 25 mg by mouth 3 (three) times daily as needed for dizziness.   prn   Multiple Vitamin (MULTIVITAMIN WITH  MINERALS) TABS tablet Take 1 tablet by mouth daily.   04/07/2021   potassium chloride SA (KLOR-CON M) 20 MEQ tablet Take 1 tablet (20 mEq total) by mouth 2 (two) times daily. 60 tablet 0 04/07/2021   tiZANidine (ZANAFLEX) 4 MG tablet Take 1 tablet (4 mg total) by mouth every 8 (eight) hours as needed for muscle spasms. 30 tablet 0 04/06/2021   vitamin B-12 (CYANOCOBALAMIN) 1000 MCG tablet Take 1,000 mcg by mouth in the morning.   04/07/2021   anastrozole (ARIMIDEX) 1 MG tablet Take 1 tablet (1 mg total) by mouth daily. (Patient not taking: Reported on 03/19/2021) 30 tablet 1 Not Taking   lidocaine-prilocaine (EMLA) cream Apply 1 application topically as directed. USE PRE OPERATIVELY THE MORNING OF PROCEDURE.   prn   ondansetron (ZOFRAN-ODT) 4 MG disintegrating tablet Take 1 tablet (4 mg total) by mouth every 8 (eight) hours as needed for nausea or vomiting. 20 tablet 0 prn   No Known Allergies Past Medical History:  Diagnosis Date   Breast cancer ([place])    Family history of breast cancer    Family history of colon cancer    Hepatitis    A-YEARS AGO   Hypercholesteremia    Hypertension    Hypothyroidism    Osteopenia of multiple sites    Statin-induced myositis  Thyroid cancer [facility])    Past Surgical History:  Procedure Laterality Date   BREAST BIOPSY Right 02/10/2021   [place] bx, heart marker, path pending   BREAST BIOPSY Right 02/18/2021   stereo bx, distortion "COIL" clip-path pending   LUMBAR LAMINECTOMY/ DECOMPRESSION WITH MET-RX Right 09/16/2020   Procedure: Right Lumbar five Sacral one Minimally  invasive Discectomy with Metrex;  Surgeon: Judith Part, MD;  Location: [place];  Service: Neurosurgery;  Laterality: Right;   NO PAST SURGERIES     THYROIDECTOMY Bilateral 03/26/2015   Procedure: THYROIDECTOMY;  Surgeon: Clyde Canterbury, MD;  Location: [facility];  Service: ENT;  Laterality: Bilateral;   Social History   Socioeconomic History   Marital status: Married    Spouse name: Not  on file   Number of children: Not on file   Years of education: Not on file   Highest education level: Not on file  Occupational History   Not on file  Tobacco Use   Smoking status: Former    Packs/day: 1.00    Years: 20.00    Pack years: 20.00    Types: Cigarettes    Quit date: 03/21/1983    Years since quitting: 38.0   Smokeless tobacco: Never  Vaping Use   Vaping Use: Never used  Substance and Sexual Activity   Alcohol use: No    Comment: RARE   Drug use: No   Sexual activity: Not Currently  Other Topics Concern   Not on file  Social History Narrative   Not on file   Social Determinants of Health   Financial Resource Strain: Not on file  Food Insecurity: Not on file  Transportation Needs: Not on file  Physical Activity: Not on file  Stress: Not on file  Social Connections: Not on file  Intimate Partner Violence: Not on file   Social History   Social History Narrative   Not on file     ROS: Negative.     PE: HEENT: Negative. Lungs: Clear. Cardio: RR.   Assessment/Plan:  Proceed with planned right mastectomy with expander implantation.  Forest Gleason [facility] 04/08/2021

## 2021-04-08 NOTE — Op Note (Signed)
Preoperative diagnosis: Invasive mammary carcinoma of the right breast.  Postoperative diagnosis: Same.  Operative procedure: Right simple mastectomy with sentinel node biopsy, ultrasound guidance.  Operating Surgeon: Hervey Ard, MD.  Anesthesia: General endotracheal; Marcaine 0.5% with 1: 200,000 units of epinephrine, pectoralis block.  Estimated blood loss: 5 cc.  Clinical note: This 71 year old woman was recent identified with breast cancer.  After reviewing all options for lumpectomy with or without radiation or mastectomy with reconstruction she is chosen the latter.  She received Ancef prior to the procedure.  SCD stockings for DVT prevention.  She underwent injection with technetium sulfur colloid prior to presentation of the operating theater.  Operative note: With the patient under adequate general endotracheal anesthesia the periareolar skin was cleansed with alcohol and 5 cc of 0.5% methylene blue instilled in the subareolar plexus.  The breast, axilla and neck was then cleansed was then cleansed with ChloraPrep and draped.  Under ultrasound guidance pectoralis block was placed, image placed in the chart for permanent record.  The prior biopsy site was identified in the upper inner quadrant of the breast fairly close to the skin.  This was identified to have the mastectomy incision outside of it.  The patient had a generous triple D breast.  Flaps were outlined in elliptical fashion and the inframammary fold was preserved.  The skin was incised sharply and the remaining dissection completed with electrocautery.  Hemostasis was electrocautery and 3-0 Vicryl ties and for 1 intercostal perforator a 3-0 Vicryl suture ligature.  The skin and soft tissue was elevated off the breast envelope to the clavicle superiorly, sternum medially, inframammary fold inferiorly and the serratus muscle laterally.  Once the breast was elevated off the underlying pectoralis muscle, taking the fascia with the  specimen, attention was turned to the axilla.  8 mm hot, blue nodes were identified with counts up to 9000.  3 small 5 mm nodes were identified with counts ranging from 2000-500.  These 5 nodes were sent together as sentinel nodes 1-5.  Good hemostasis was noted in the axillary envelope.  The breast specimen was sent to pathology per protocol.  At this point Dr. Marla Roe joined the procedure for planned placement of a tissue expander behind the pectoralis muscle.

## 2021-04-08 NOTE — Anesthesia Preprocedure Evaluation (Addendum)
Anesthesia Evaluation  Patient identified by MRN, date of birth, ID band Patient awake    Reviewed: Allergy & Precautions, NPO status , Patient's Chart, lab work & pertinent test results  History of Anesthesia Complications Negative for: history of anesthetic complications  Airway Mallampati: II   Neck ROM: Full    Dental no notable dental hx.    Pulmonary former smoker (quit 1985),    Pulmonary exam normal breath sounds clear to auscultation       Cardiovascular hypertension, Normal cardiovascular exam Rhythm:Regular Rate:Normal  ECG 03/31/21:  Normal sinus rhythm Possible Anterior infarct (cited on or before 17-Aug-2020) Abnormal ECG When compared with ECG of 17-Aug-2020 14:52, No significant change was found   Neuro/Psych negative neurological ROS     GI/Hepatic negative GI ROS,   Endo/Other  Hypothyroidism Thyroid CA s/p thyroidectomy; obesity   Renal/GU negative Renal ROS     Musculoskeletal   Abdominal   Peds  Hematology Breast CA   Anesthesia Other Findings   Reproductive/Obstetrics                            Anesthesia Physical Anesthesia Plan  ASA: 2  Anesthesia Plan: General   Post-op Pain Management:    Induction: Intravenous  PONV Risk Score and Plan: 3 and Ondansetron, Dexamethasone and Treatment may vary due to age or medical condition  Airway Management Planned: Oral ETT  Additional Equipment:   Intra-op Plan:   Post-operative Plan: Extubation in OR  Informed Consent: I have reviewed the patients History and Physical, chart, labs and discussed the procedure including the risks, benefits and alternatives for the proposed anesthesia with the patient or authorized representative who has indicated his/her understanding and acceptance.     Dental advisory given  Plan Discussed with: CRNA  Anesthesia Plan Comments: (Patient consented for risks of anesthesia  including but not limited to:  - adverse reactions to medications - damage to eyes, teeth, lips or other oral mucosa - nerve damage due to positioning  - sore throat or hoarseness - damage to heart, brain, nerves, lungs, other parts of body or loss of life  Informed patient about role of CRNA in peri- and intra-operative care.  Patient voiced understanding.)        Anesthesia Quick Evaluation

## 2021-04-08 NOTE — Interval H&P Note (Signed)
History and Physical Interval Note:  04/08/2021 12:14 PM  Gabrielle Davidson  has presented today for surgery, with the diagnosis of right breast cancer.  The various methods of treatment have been discussed with the patient and family. After consideration of risks, benefits and other options for treatment, the patient has consented to  Procedure(s): MASTECTOMY WITH AXILLARY LYMPH NODE DISSECTION (Right) RIGHT BREAST RECONSTRUCTION WITH PLACEMENT OF TISSUE EXPANDER AND FLEX HD (ACELLULAR HYDRATED DERMIS) (Right) as a surgical intervention.  The patient's history has been reviewed, patient examined, no change in status, stable for surgery.  I have reviewed the patient's chart and labs.  Questions were answered to the patient's satisfaction.     [name]

## 2021-04-08 NOTE — Op Note (Signed)
Op report    DATE OF OPERATION:  04/08/2021  LOCATION:  [facility]  SURGICAL DIVISION: Plastic Surgery  PREOPERATIVE DIAGNOSES:  1. Right Breast cancer.    POSTOPERATIVE DIAGNOSES:  1. Right Breast cancer.   PROCEDURE:  1. Right immediate breast reconstruction with placement of Acellular Dermal Matrix and tissue expanders.  SURGEON: Suhaib Guzzo Sanger Makenna Macaluso, DO  ANESTHESIA:  General.   COMPLICATIONS: None.   IMPLANTS: Right - Mentor 535 cc. Ref #[id number].  50 cc of injectable saline placed in the expander. Acellular Dermal Matrix 6 x 16 cm Flex HD  INDICATIONS FOR PROCEDURE:  The patient, Gabrielle Davidson, is a 71 y.o. female born on [birth year], is here for  immediate first stage breast reconstruction with placement of right tissue expander and Acellular dermal matrix. MRN: [id number]  CONSENT:  Informed consent was obtained directly from the patient. Risks, benefits and alternatives were fully discussed. Specific risks including but not limited to bleeding, infection, hematoma, seroma, scarring, pain, implant infection, implant extrusion, capsular contracture, asymmetry, wound healing problems, and need for further surgery were all discussed. The patient did have an ample opportunity to have her questions answered to her satisfaction.   DESCRIPTION OF PROCEDURE:  The patient was taken to the operating room by the general surgery team. SCDs were placed and IV antibiotics were given. The patient's chest was prepped and draped in a sterile fashion. A time out was performed and the implants to be used were identified.  A right mastectomy was performed.  Once the general surgery team had completed their portion of the case the patient was rendered to the plastic and reconstructive surgery team.  Right:  The pectoralis major muscle was lifted from the chest wall with release of the lateral edge and lateral inframammary fold.  The pocket was irrigated with antibiotic  solution and hemostasis was achieved with electrocautery.  The ADM was then prepared according to the manufacture guidelines and slits placed to help with postoperative fluid management.  The ADM was then sutured to the inferior and lateral edge of the inframammary fold with 2-0 PDS starting with an interrupted stitch and then a running stitch.  The lateral portion was sutured to with interrupted sutures after the expander was placed.  The expander was prepared according to the manufacture guidelines, the air evacuated and then it was placed under the ADM and pectoralis major muscle.  The inferior and lateral tabs were used to secure the expander to the chest wall with 2-0 PDS.  The skin was very tight. The drain was placed laterally and secured to the skin with 3-0 Silk.  The deep layers were closed with 2-0 PDS and 3-0 PDS.  The skin was closed with 3-0 and 4-0 Monocryl and then dermabond was applied.  The ABDs and breast binder were placed.  The patient tolerated the procedure well and there were no complications.  The patient was allowed to wake from anesthesia and taken to the recovery room in satisfactory condition.

## 2021-04-08 NOTE — Discharge Instructions (Addendum)
INSTRUCTIONS FOR AFTER BREAST SURGERY   You will likely have some questions about what to expect following your operation.  The following information will help you and your family understand what to expect when you are discharged from the hospital.  Following these guidelines will help ensure a smooth recovery and reduce risks of complications.  Postoperative instructions include information on: diet, wound care, medications and physical activity.  AFTER SURGERY Expect to go home after the procedure.  In some cases, you may need to spend one night in the hospital for observation.  DIET Breast surgery does not require a specific diet.  However, the healthier you eat the better your body can start healing. It is important to increasing your protein intake.  This means limiting the foods with sugar and carbohydrates.  Focus on vegetables and some meat.  If you have any liposuction during your procedure be sure to drink water.  If your urine is bright yellow, then it is concentrated, and you need to drink more water.  As a general rule after surgery, you should have 8 ounces of water every hour while awake.  If you find you are persistently nauseated or unable to take in liquids let us know.  NO TOBACCO USE or EXPOSURE.  This will slow your healing process and increase the risk of a wound.  WOUND CARE Leave the ACE wrap or binder on for 3 days . Use fragrance free soap.   After 3 days you can remove the ACE wrap or binder to shower. Once dry apply ACE wrap, binder or sports bra.  Use a mild soap like Dial, Dove and [place]. You may have Topifoam or Lipofoam on.  It is soft and spongy and helps keep you from getting creases if you have liposuction.  This can be removed before the shower and then replaced.  If you need more it is available on Amazon (Lipofoam). If you have steri-strips / tape directly attached to your skin leave them in place. It is OK to get these wet.   No baths, pools or hot tubs for four  weeks. We close your incision to leave the smallest and best-looking scar. No ointment or creams on your incisions until given the go ahead.  Especially not Neosporin (Too many skin reactions with this one).  A few weeks after surgery you can use Mederma and start massaging the scar. We ask you to wear your binder or sports bra for the first 6 weeks around the clock, including while sleeping. This provides added comfort and helps reduce the fluid accumulation at the surgery site.  ACTIVITY No heavy lifting until cleared by the doctor.  This usually means no more than a half-gallon of milk.  It is OK to walk and climb stairs. In fact, moving your legs is very important to decrease your risk of a blood clot.  It will also help keep you from getting deconditioned.  Every 1 to 2 hours get up and walk for 5 minutes. This will help with a quicker recovery back to normal.  Let pain be your guide so you don't do too much.  This is not the time for spring cleaning and don't plan on taking care of anyone else.  This time is for you to recover,  You will be more comfortable if you sleep and rest with your head elevated either with a few pillows under you or in a recliner.  No stomach sleeping for a three months.  WORK Everyone  returns to work at different times. As a rough guide, most people take at least 1 - 2 weeks off prior to returning to work. If you need documentation for your job, bring the forms to your postoperative follow up visit.  DRIVING Arrange for someone to bring you home from the hospital.  You may be able to drive a few days after surgery but not while taking any narcotics or valium.  BOWEL MOVEMENTS Constipation can occur after anesthesia and while taking pain medication.  It is important to stay ahead for your comfort.  We recommend taking Milk of Magnesia (2 tablespoons; twice a day) while taking the pain pills.  MEDICATIONS You may be prescribed should start after surgery At your  preoperative visit for you history and physical you may have been given the following medications: An antibiotic: Start this medication when you get home and take according to the instructions on the bottle. Zofran 4 mg:  This is to treat nausea and vomiting.  You can take this every 6 hours as needed and only if needed. Valium 2 mg: This is for muscle tightness if you have an implant or expander. This will help relax your muscle which also helps with pain control.  This can be taken every 12 hours as needed. Don't drive after taking this medication. Norco (hydrocodone/acetaminophen) 5/325 mg:  This is only to be used after you have taken the motrin or the tylenol. Every 8 hours as needed.   Over the counter Medication to take: Ibuprofen (Motrin) 600 mg:  Take this every 6 hours.  If you have additional pain then take 500 mg of the tylenol every 8 hours.  Only take the Norco after you have tried these two. Miralax or stool softener of choice: Take this according to the bottle if you take the [place] Call your surgeon's office if any of the following occur: Fever 101 degrees F or greater Excessive bleeding or fluid from the incision site. Pain that increases over time without aid from the medications Redness, warmth, or pus draining from incision sites Persistent nausea or inability to take in liquids Severe misshapen area that underwent the operation.  Here are some resources:  Plastic surgery website: https://www.plasticsurgery.org/for-medical-professionals/education-and-resources/publications/breast-reconstruction-magazine Breast Reconstruction Awareness Campaign:  [employer]nz Plastic surgery Implant information:  https://www.plasticsurgery.org/patient-safety/breast-implant-safety  AMBULATORY SURGERY  DISCHARGE INSTRUCTIONS   The drugs that you were given will stay in your system until tomorrow so for the next 24 hours you should not:  Drive an  automobile Make any legal decisions Drink any alcoholic beverage   You may resume regular meals tomorrow.  Today it is better to start with liquids and gradually work up to solid foods.  You may eat anything you prefer, but it is better to start with liquids, then soup and crackers, and gradually work up to solid foods.   Please notify your doctor immediately if you have any unusual bleeding, trouble breathing, redness and pain at the surgery site, drainage, fever, or pain not relieved by medication.    Your post-operative visit with Dr.                                       is: Date:   04/21/2021                   Time:  0930am  Please call to schedule your post-operative visit.  Additional Instructions:

## 2021-04-08 NOTE — Anesthesia Procedure Notes (Signed)
Procedure Name: Intubation Date/Time: 04/08/2021 11:58 AM Performed by: Kelton Pillar, CRNA Pre-anesthesia Checklist: Patient identified, Emergency Drugs available, Suction available and Patient being monitored Patient Re-evaluated:Patient Re-evaluated prior to induction Oxygen Delivery Method: Circle system utilized Preoxygenation: Pre-oxygenation with 100% oxygen Induction Type: IV induction Ventilation: Mask ventilation without difficulty Laryngoscope Size: McGraph and 3 Grade View: Grade I Tube type: Oral Tube size: 6.5 mm Number of attempts: 1 Airway Equipment and Method: Stylet and Oral airway Placement Confirmation: ETT inserted through vocal cords under direct vision, positive ETCO2, breath sounds checked- equal and bilateral and CO2 detector Secured at: 21 cm Tube secured with: Tape Dental Injury: Teeth and Oropharynx as per pre-operative assessment

## 2021-04-09 ENCOUNTER — Encounter: Payer: Self-pay | Admitting: Plastic Surgery

## 2021-04-10 ENCOUNTER — Encounter: Payer: Self-pay | Admitting: General Surgery

## 2021-04-10 ENCOUNTER — Telehealth: Payer: Self-pay

## 2021-04-10 NOTE — Telephone Encounter (Signed)
Spoke to pt and she reported that there is still no drainage in the bulb. She confirmed that the bulb is squeezed but the drainage and blood seems to be coming out onto the binder instead of through the drain and into the bulb. I asked pt if there was anything in the tubing of the drain and she stated she didn't know, that she couldn't see it because she had her binder on and her husband is not there to help. She stated her husband will not be back x several hours. I adv her that it would be hard to assist her without seeing the drain. Adv that there are not any providers here as it was after 4 pm.   Per Donna Christen (by phone call), pt can apply guaze, abd pads or maxi pads for reinforcement until Monday. She can also wash binder so that she is not sitting in blood. Dr. Marla Roe and Catalina Antigua are in office that afternoon. Have FD reach out to pt to bring in Monday when they are are here. If they are not able to see her then ask Dr. Erin Hearing if he'd be willing to examine (she is not his pt).

## 2021-04-10 NOTE — Telephone Encounter (Signed)
Patient received right breast surgery on Wednesday. She is not able to measure the drainage because the drainage tubing is not functioning. She is seeing blood drainage on the binder. Patient would like a follow up call.   Call back phone [phone].

## 2021-04-10 NOTE — Telephone Encounter (Signed)
Adv pt of suggestions, she is conveyed understanding. Also aware she's been scheduled to see Dr. Marla Roe on Monday, 1/30 @ 2:45 pm. If her sx worsen, pt is aware to call the office and the call will forward to the provider on call.

## 2021-04-13 ENCOUNTER — Ambulatory Visit: Payer: Medicare HMO | Admitting: Surgical

## 2021-04-13 ENCOUNTER — Telehealth: Payer: Self-pay

## 2021-04-13 LAB — SURGICAL PATHOLOGY

## 2021-04-13 NOTE — Telephone Encounter (Signed)
Per Colletta Maryland, pt cx appt for today @ 2:45 pm with Dr. Marla Roe.

## 2021-04-13 NOTE — Telephone Encounter (Signed)
Patient called to say her drainage tube started working on Saturday and she said it is working fine right now.  Patient would like to know if she still needs to come in for her appointment today.  Please call.

## 2021-04-14 ENCOUNTER — Telehealth: Payer: Self-pay

## 2021-04-14 ENCOUNTER — Other Ambulatory Visit: Payer: Self-pay | Admitting: Oncology

## 2021-04-14 NOTE — Telephone Encounter (Signed)
Request for Oncotype testing has been faxed to [facility] @ [phone] on breast Specimen [phone].   [id number]

## 2021-04-14 NOTE — Telephone Encounter (Signed)
-----   Message from Earlie Server, MD sent at 04/13/2021 11:02 PM EST ----- Please send oncotype dx on her surgical specimen on 04/08/21.  Schedule MD  follow up 2- 3 weeks after oncotype dx is sent. Thanks.

## 2021-04-15 NOTE — Telephone Encounter (Signed)
Spoke to patient who states that she did not have lumpectomy as planned, but a mastectomy instead and that aromatase inhibitor was discontinued by Dr. Bary Castilla.She did not know if she still needed to be seen. She was informed that the visit is to discuss Oncotype testing results. Pt verbalized understanding and accepted appt for 2/22.

## 2021-04-17 ENCOUNTER — Other Ambulatory Visit: Payer: Self-pay

## 2021-04-17 ENCOUNTER — Ambulatory Visit (INDEPENDENT_AMBULATORY_CARE_PROVIDER_SITE_OTHER): Payer: Medicare HMO | Admitting: Plastic Surgery

## 2021-04-17 DIAGNOSIS — C50919 Malignant neoplasm of unspecified site of unspecified female breast: Secondary | ICD-10-CM

## 2021-04-17 MED ORDER — DIAZEPAM 2 MG PO TABS: 2.0000 mg | ORAL_TABLET | Freq: Two times a day (BID) | ORAL | 0 refills | Status: DC | PRN

## 2021-04-17 NOTE — Progress Notes (Signed)
° °  Subjective:    Patient ID: Gabrielle Davidson, female    DOB: 1950-04-27, 71 y.o.   MRN: [id number]  HPI The patient is a 71 year old female here for follow-up after right breast reconstruction.  She had the mastectomy and an expander placed on 1/25.  We placed 50 cc in the expander.  Her skin flaps look good.  No sign of hematoma or seroma.   Review of Systems  Constitutional:  Positive for activity change. Negative for appetite change.  Eyes: Negative.   Respiratory: Negative.    Cardiovascular: Negative.  Negative for palpitations.  Gastrointestinal: Negative.   Genitourinary: Negative.       Objective:   Physical Exam Constitutional:      Appearance: Normal appearance.  Cardiovascular:     Rate and Rhythm: Normal rate.     Pulses: Normal pulses.  Skin:    Capillary Refill: Capillary refill takes less than 2 seconds.  Neurological:     Mental Status: She is alert and oriented to person, place, and time.       Assessment & Plan:  No diagnosis found.   We placed injectable saline in the Expander using a sterile technique: Right: 50 cc for a total of 100 / 535 cc  We will plan to remove the drain next visit.  I will refill her Valium but encouraged her to just take it after her expansions.

## 2021-04-20 NOTE — Telephone Encounter (Signed)
Please see Mychart message from patient. Shes requesting appt on 2/22 to be moved to the afternoon or another day. If no room, another that week if fine. Please inform pt of updated appts

## 2021-04-27 ENCOUNTER — Encounter: Payer: Self-pay | Admitting: Oncology

## 2021-04-27 NOTE — Telephone Encounter (Signed)
Oncotype results scanned in media.  

## 2021-05-01 ENCOUNTER — Ambulatory Visit (INDEPENDENT_AMBULATORY_CARE_PROVIDER_SITE_OTHER): Payer: Medicare HMO | Admitting: Surgical

## 2021-05-01 ENCOUNTER — Other Ambulatory Visit: Payer: Self-pay

## 2021-05-01 DIAGNOSIS — C50411 Malignant neoplasm of upper-outer quadrant of right female breast: Secondary | ICD-10-CM

## 2021-05-01 DIAGNOSIS — C50919 Malignant neoplasm of unspecified site of unspecified female breast: Secondary | ICD-10-CM

## 2021-05-01 NOTE — Progress Notes (Signed)
71 year old female here for follow-up on her right breast reconstruction.  She had mastectomy with expander placement on 04/08/21.  She reports overall she is doing well, she reports that she has been using the Valium as needed for muscle spasms, she reports it makes her sleepy.  She reports that she has had Flexeril in the past, however that makes her much more sleepy than the Valium.  She reports that JP drain output from the right breast has been approximately 30 cc per 24 hours.  She is not having any infectious symptoms.  She does report some numbness of her right breast, extending into her arm  Chaperone present on exam On exam right breast incision is intact, serosanguineous drainage in canister, there is no erythema or cellulitic changes.  No subcutaneous fluid collections noted with palpation.  No ecchymosis.  We placed injectable saline in the Expander using a sterile technique: Right: 70 cc for a total of 170 / 535 cc  Recommend following up in 1 week for reevaluation and removal of right JP drain.

## 2021-05-06 ENCOUNTER — Ambulatory Visit: Payer: Medicare HMO | Admitting: Oncology

## 2021-05-06 ENCOUNTER — Ambulatory Visit: Payer: Self-pay | Admitting: Licensed Clinical Social Worker

## 2021-05-06 ENCOUNTER — Telehealth: Payer: Self-pay | Admitting: Licensed Clinical Social Worker

## 2021-05-06 ENCOUNTER — Encounter: Payer: Self-pay | Admitting: Licensed Clinical Social Worker

## 2021-05-06 ENCOUNTER — Telehealth: Payer: Self-pay

## 2021-05-06 DIAGNOSIS — Z1379 Encounter for other screening for genetic and chromosomal anomalies: Secondary | ICD-10-CM | POA: Insufficient documentation

## 2021-05-06 NOTE — Telephone Encounter (Signed)
Patient called to say she has an appointment scheduled with [place] tomorrow to have her drains removed.  She said she has already drained 50 cc's this morning, so she isn't sure whether or not she needs to keep her appointment with [place] tomorrow.  Please call.

## 2021-05-06 NOTE — Telephone Encounter (Signed)
Returned patients call. 04/08/2021 patient had reconstruction of the right breast with expander placed with. Dr. Marla Roe. Patient has been averaging 50cc's daily output. Advised she needs to keep tomorrows appointment to continue to have her expander filled as well as having a consult for BL blepharoplasty with Dr. Erin Hearing. Patient will conveyed and will keep her appointment.

## 2021-05-06 NOTE — Progress Notes (Signed)
HPI:  Gabrielle Davidson was previously seen in the [place] clinic due to a personal and family history of cancer and concerns regarding a hereditary predisposition to cancer. Please refer to our prior cancer genetics clinic note for more information regarding our discussion, assessment and recommendations, at the time. Gabrielle Davidson's recent genetic test results were disclosed to her, as were recommendations warranted by these results. These results and recommendations are discussed in more detail below.  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 The final report date is 04/03/2021.  The CancerNext-Expanded + RNAinsight gene panel offered by [employer] and includes sequencing and rearrangement analysis for the following 77 genes: IP, ALK, APC*, ATM*, AXIN2, BAP1, BARD1, BLM, BMPR1A, BRCA1*, BRCA2*, BRIP1*, CDC73, CDH1*,CDK4, CDKN1B, CDKN2A, CHEK2*, CTNNA1, DICER1, FANCC, FH, FLCN, GALNT12, KIF1B, LZTR1, MAX, MEN1, MET, MLH1*, MSH2*, MSH3, MSH6*, MUTYH*, NBN, NF1*, NF2, NTHL1, PALB2*, PHOX2B, PMS2*, POT1, PRKAR1A, PTCH1, PTEN*, RAD51C*, RAD51D*,RB1, RECQL, RET, SDHA, SDHAF2, SDHB, SDHC, SDHD, SMAD4, SMARCA4, SMARCB1, SMARCE1, STK11, SUFU, TMEM127, TP53*,TSC1, TSC2, VHL and XRCC2 (sequencing and deletion/duplication); EGFR, EGLN1, HOXB13, KIT, MITF, PDGFRA, POLD1 and POLE (sequencing only); EPCAM and GREM1 (deletion/duplication only).     FAMILY HISTORY:  We obtained a detailed,  4-generation family history.  Significant diagnoses are listed below: Family History  Problem Relation Age of Onset   Alzheimer's disease Mother    Hernia Mother    Heart disease Father    Colon cancer Maternal Aunt        d. under 44   Breast cancer Paternal Aunt 87   Heart attack Maternal Grandfather    Diabetes Paternal Grandmother    Breast cancer Cousin 36   Gabrielle Davidson has 1 brother, 68, 2 sisters, 48 and 50. She has 2 nephews and 1 niece. None have had cancer.   Gabrielle Davidson mother died at 57. Gabrielle Davidson had approximately 7 maternal aunts/uncles. One aunt died of colon cancer under the age of 59, the cancer may have started somewhere else. A maternal cousin had breast cancer at 32. Grandfather died of heart issues, grandmother died of internal bleeding.   Gabrielle Davidson father died at 41. Gabrielle Davidson had several paternal aunts and 1 uncle. A paternal aunt had breast cancer, likely in her 56s, and died later in life. No known paternal cousins with cancer. Paternal grandmother died in her 74s-70s, grandfather died under 50.    Gabrielle Davidson is unaware of previous family history of genetic testing for hereditary cancer risks. Gabrielle Davidson's maternal ancestors are of [place] descent, and paternal ancestors are of [place] descent. There is no reported Ashkenazi Jewish ancestry. There is no known consanguinity.       GENETIC TEST RESULTS: Genetic testing reported out on 04/03/2021 through the [employer] CancerNext-Expanded+RNA cancer panel found no pathogenic mutations.   The CancerNext-Expanded + RNAinsight gene panel offered by [employer] and includes sequencing and rearrangement analysis for the following 77 genes: IP, ALK, APC*, ATM*, AXIN2, BAP1, BARD1, BLM, BMPR1A, BRCA1*, BRCA2*, BRIP1*, CDC73, CDH1*,CDK4, CDKN1B, CDKN2A, CHEK2*, CTNNA1,  DICER1, FANCC, FH, FLCN, GALNT12, KIF1B, LZTR1, MAX, MEN1, MET, MLH1*, MSH2*, MSH3, MSH6*, MUTYH*, NBN, NF1*, NF2, NTHL1, PALB2*, PHOX2B, PMS2*, POT1,  PRKAR1A, PTCH1, PTEN*, RAD51C*, RAD51D*,RB1, RECQL, RET, SDHA, SDHAF2, SDHB, SDHC, SDHD, SMAD4, SMARCA4, SMARCB1, SMARCE1, STK11, SUFU, TMEM127, TP53*,TSC1, TSC2, VHL and XRCC2 (sequencing and deletion/duplication); EGFR, EGLN1, HOXB13, KIT, MITF, PDGFRA, POLD1 and POLE (sequencing only); EPCAM and GREM1 (deletion/duplication only).   The test report has been scanned into EPIC and is located under the Molecular Pathology section of the Results Review tab.  A portion of the result report is included below for reference.    We discussed that because current genetic testing is not perfect, it is possible there may be a gene mutation in one of these genes that current testing cannot detect, but that chance is small.  There could be another gene that has not yet been discovered, or that we have not yet tested, that is responsible for the cancer diagnoses in the family. It is also possible there is a hereditary cause for the cancer in the family that Gabrielle Davidson did not inherit and therefore was not identified in her testing.  Therefore, it is important to remain in touch with cancer genetics in the future so that we can continue to offer Gabrielle Davidson the most up to date genetic testing.   Genetic testing did identify a variant of uncertain significance (VUS) in the DICER1 gene called c.4091G>C.  At this time, it is unknown if this variant is associated with increased cancer risk or if this is a normal finding, but most variants such as this get reclassified to being inconsequential. It should not be used to make medical management decisions. With time, we suspect the lab will determine the significance of this variant, if any. If we do learn more about it we will try to contact Gabrielle Davidson to discuss it further. However, it is important to stay in touch with [place] periodically and keep the address and phone number up to date.  ADDITIONAL GENETIC TESTING: We discussed with Gabrielle Davidson that her genetic  testing was fairly extensive.  If there are genes identified to increase cancer risk that can be analyzed in the future, we would be happy to discuss and coordinate this testing at that time.    CANCER SCREENING RECOMMENDATIONS: Gabrielle Davidson test result is considered negative (normal).  This means that we have not identified a hereditary cause for her  personal and family history of cancer at this time. Most cancers happen by chance and this negative test suggests that her cancer may fall into this category.    While reassuring, this does not definitively rule out a hereditary predisposition to cancer. It is still possible that there could be genetic mutations that are undetectable by current technology. There could be genetic mutations in genes that have not been tested or identified to increase cancer risk.  Therefore, it is recommended she continue to follow the cancer management and screening guidelines provided by her oncology and primary healthcare provider.   An individual's cancer risk and medical management are not determined by genetic test results alone. Overall cancer risk assessment incorporates additional factors, including personal medical history, family history, and any available genetic information that may result in a personalized plan for cancer prevention and surveillance.  RECOMMENDATIONS FOR FAMILY MEMBERS:  Relatives in this family might be at some increased risk of developing cancer, over the general population risk, simply due to the family history of cancer.  We  recommended female relatives in this family have a yearly mammogram beginning at age 70, or 79 years younger than the earliest onset of cancer, an annual clinical breast exam, and perform monthly breast self-exams. Female relatives in this family should also have a gynecological exam as recommended by their primary provider.  All family members should be referred for colonoscopy starting at age 38.    It is also  possible there is a hereditary cause for the cancer in Gabrielle Davidson family that she did not inherit and therefore was not identified in her.  Based on Gabrielle Davidson family history, we recommended those related to her pat and mat aunt who had cancer under 20 have genetic counseling and testing. Gabrielle Davidson will let us know if we can be of any assistance in coordinating genetic counseling and/or testing for these family members.  FOLLOW-UP: Lastly, we discussed with Gabrielle Davidson that cancer genetics is a rapidly advancing field and it is possible that new genetic tests will be appropriate for her and/or her family members in the future. We encouraged her to remain in contact with cancer genetics on an annual basis so we can update her personal and family histories and let her know of advances in cancer genetics that may benefit this family.   Our contact number was provided. Gabrielle Davidson's questions were answered to her satisfaction, and she knows she is welcome to call us at anytime with additional questions or concerns.   Faith Rogue, MS, [facility] Genetic Counselor [email or web address] Phone: [phone]

## 2021-05-06 NOTE — Telephone Encounter (Signed)
Revealed negative genetic testing.  Revealed that a VUS in DICER1 was identified. This normal result is reassuring and indicates that it is unlikely Gabrielle Davidson's cancer is due to a hereditary cause.  It is unlikely that there is an increased risk of another cancer due to a mutation in one of these genes.  However, genetic testing is not perfect, and cannot definitively rule out a hereditary cause.  It will be important for her to keep in contact with genetics to learn if any additional testing may be needed in the future.

## 2021-05-06 NOTE — Telephone Encounter (Signed)
Attempted to reach pt x3 with remainder of genetic testing. Will send patient a letter with information on how to contact us to discuss genetic test results.

## 2021-05-07 ENCOUNTER — Encounter: Payer: Self-pay | Admitting: Plastic Surgery

## 2021-05-07 ENCOUNTER — Ambulatory Visit (INDEPENDENT_AMBULATORY_CARE_PROVIDER_SITE_OTHER): Payer: Medicare HMO | Admitting: Plastic Surgery

## 2021-05-07 ENCOUNTER — Ambulatory Visit: Payer: Medicare HMO | Admitting: Physician Assistant

## 2021-05-07 DIAGNOSIS — H02831 Dermatochalasis of right upper eyelid: Secondary | ICD-10-CM

## 2021-05-07 DIAGNOSIS — H02834 Dermatochalasis of left upper eyelid: Secondary | ICD-10-CM

## 2021-05-07 NOTE — Progress Notes (Signed)
Referring Provider Idelle Crouch, MD [address],  [place] [id number]   CC:  upper eyelid dermatochalasis  Gabrielle Davidson is an 71 y.o. female.  HPI: Patient is a 71 year old who presents for evaluation of eyelids.  She notices upper eyelid dermatochalasis which may be affecting her peripheral vision.  She is also interested in cosmetic improvement of her lower eyelids.  She recently had breast reconstruction and is also here for a follow-up visit for her breast reconstruction.  Her drain has been putting out 90 ml in 24 hours.  She currently has 170 ml and a 535 ml expander.  No Known Allergies  Outpatient Encounter Medications as of 05/07/2021  Medication Sig Note   amitriptyline (ELAVIL) 25 MG tablet Take 25 mg by mouth at bedtime.    Ascorbic Acid (VITAMIN C) 1000 MG tablet Take 1,000 mg by mouth in the morning and at bedtime.    aspirin EC 81 MG tablet Take 1 tablet (81 mg total) by mouth at bedtime. Restart 09/23/20    Biotin 10000 MCG TABS Take 10,000 mcg by mouth in the morning and at bedtime.    Calcium Carbonate-Vitamin D3 600-400 MG-UNIT TABS Take 1-2 tablets by mouth See admin instructions. 1 tablet in the morning and 2 tablets at bedtime    chlorthalidone (HYGROTON) 25 MG tablet Take 25 mg by mouth in the morning.    diazepam (VALIUM) 2 MG tablet Take 1 tablet (2 mg total) by mouth every 8 (eight) hours as needed for anxiety.    diazepam (VALIUM) 2 MG tablet Take 1 tablet (2 mg total) by mouth every 12 (twelve) hours as needed for anxiety.    FLUoxetine (PROZAC) 40 MG capsule Take 40 mg by mouth every morning.     gabapentin (NEURONTIN) 300 MG capsule Take 300 mg by mouth in the morning and at bedtime.    hydrOXYzine (ATARAX) 10 MG tablet Take 10 mg by mouth 3 (three) times daily as needed for itching.    ibuprofen (ADVIL) 200 MG tablet Take 400-800 mg by mouth every 8 (eight) hours as needed (PAIN.).    levothyroxine (SYNTHROID) 88  MCG tablet Take 88 mcg by mouth daily before breakfast.    lidocaine-prilocaine (EMLA) cream Apply 1 application topically as directed. USE PRE OPERATIVELY THE MORNING OF PROCEDURE.    losartan (COZAAR) 100 MG tablet Take 100 mg by mouth in the morning.    meclizine (ANTIVERT) 25 MG tablet Take 25 mg by mouth 3 (three) times daily as needed for dizziness.    Multiple Vitamin (MULTIVITAMIN WITH MINERALS) TABS tablet Take 1 tablet by mouth daily.    ondansetron (ZOFRAN-ODT) 4 MG disintegrating tablet Take 1 tablet (4 mg total) by mouth every 8 (eight) hours as needed for nausea or vomiting. 03/19/2021: POST OPERATIVE USE ONLY    potassium chloride SA (KLOR-CON M) 20 MEQ tablet Take 1 tablet (20 mEq total) by mouth 2 (two) times daily.    tiZANidine (ZANAFLEX) 4 MG tablet Take 1 tablet (4 mg total) by mouth every 8 (eight) hours as needed for muscle spasms.    vitamin B-12 (CYANOCOBALAMIN) 1000 MCG tablet Take 1,000 mcg by mouth in the morning.    No facility-administered encounter medications on file as of 05/07/2021.     Past Medical History:  Diagnosis Date   Breast cancer [facility])    Family history of breast cancer    Family history of colon cancer    Hepatitis  A-YEARS AGO   Hypercholesteremia    Hypertension    Hypothyroidism    Osteopenia of multiple sites    Statin-induced myositis    Thyroid cancer [facility])     Past Surgical History:  Procedure Laterality Date   BREAST BIOPSY Right 02/10/2021   [place] bx, heart marker, path pending   BREAST BIOPSY Right 02/18/2021   stereo bx, distortion "COIL" clip-path pending   BREAST RECONSTRUCTION WITH PLACEMENT OF TISSUE EXPANDER AND FLEX HD (ACELLULAR HYDRATED DERMIS) Right 04/08/2021   Procedure: RIGHT BREAST RECONSTRUCTION WITH PLACEMENT OF TISSUE EXPANDER AND FLEX HD (ACELLULAR HYDRATED DERMIS);  Surgeon: Wallace Going, DO;  Location: [facility];  Service: Plastics;  Laterality: Right;   LUMBAR LAMINECTOMY/ DECOMPRESSION WITH MET-RX  Right 09/16/2020   Procedure: Right Lumbar five Sacral one Minimally  invasive Discectomy with Metrex;  Surgeon: Judith Part, MD;  Location: [facility];  Service: Neurosurgery;  Laterality: Right;   MASTECTOMY WITH AXILLARY LYMPH NODE DISSECTION Right 04/08/2021   Procedure: MASTECTOMY WITH AXILLARY LYMPH NODE DISSECTION;  Surgeon: Robert Bellow, MD;  Location: [facility] OR[facility];  Service: General;  Laterality: Right;   NO PAST SURGERIES     THYROIDECTOMY Bilateral 03/26/2015   Procedure: THYROIDECTOMY;  Surgeon: Clyde Canterbury, MD;  Location: [facility] OR[facility];  Service: ENT;  Laterality: Bilateral;    Family History  Problem Relation Age of Onset   Alzheimer's disease Mother    Hernia Mother    Heart disease Father    Colon cancer Maternal Aunt        d. under 43   Breast cancer Paternal Aunt 74   Heart attack Maternal Grandfather    Diabetes Paternal Grandmother    Breast cancer Cousin 63    Social History   Social History Narrative   Not on file     Review of Systems General: Denies fevers, chills, weight loss CV: Denies chest pain, shortness of breath, palpitations   Physical Exam Vitals with BMI 04/08/2021 04/08/2021 04/08/2021  Height - - -  Weight - - -  BMI - - -  Systolic 709 643 98  Diastolic 64 72 78  Pulse - 80 79    General:  No acute distress,  Alert and oriented, Non-Toxic, Normal speech and affect HEENT: Marginal reflex distance 3 mm bilaterally.  Skin resting on the lashes bilaterally upper eyelids.  She has some prominence of her tear trough of her lower eyelids as well as some puffiness of her lower eyelids.  She has some descent of her tear trough bilaterally. Breast: Incisions clean dry and intact, no erythema, straw-colored serous drainage from right breast.   Assessment/Plan Upper eyelids: Recommend taped and untaped visual field test.  The patient may have a functional benefit for upper blepharoplasty.  Lower eyelid: Patient is a good candidate for extended  lower blepharoplasty.  She may have softening of her tear trough and we can also raise her tear trough.  Breast: 70 cc injected for a total of 240 cc and a 535 tissue expander.  Recommend the patient follow-up next week for evaluation of her drain.  Lennice Sites 05/07/2021, 10:26 AM

## 2021-05-07 NOTE — Addendum Note (Signed)
Addended by: Harl Bowie on: 05/07/2021 04:09 PM   Modules accepted: Orders

## 2021-05-08 ENCOUNTER — Inpatient Hospital Stay: Payer: Medicare HMO | Admitting: Oncology

## 2021-05-15 ENCOUNTER — Other Ambulatory Visit: Payer: Self-pay

## 2021-05-15 ENCOUNTER — Ambulatory Visit (INDEPENDENT_AMBULATORY_CARE_PROVIDER_SITE_OTHER): Payer: Medicare HMO | Admitting: Plastic Surgery

## 2021-05-15 ENCOUNTER — Encounter: Payer: Self-pay | Admitting: Plastic Surgery

## 2021-05-15 DIAGNOSIS — C50919 Malignant neoplasm of unspecified site of unspecified female breast: Secondary | ICD-10-CM

## 2021-05-15 MED ORDER — DOXYCYCLINE HYCLATE 100 MG PO TABS: 100.0000 mg | ORAL_TABLET | Freq: Two times a day (BID) | ORAL | 0 refills | Status: AC

## 2021-05-15 NOTE — Progress Notes (Signed)
The patient is a 71 year old female here for follow-up on her right breast reconstruction.  She is still getting fair bit of serous fluid from the drain.  I am going to leave it in for 1 more week and put more in the expander to see if that helps.  I think it is probably lymph drainage.  I am also going to send in an antibiotic just to be sure that is not a component of this. ? ?We placed injectable saline in the Expander using a sterile technique: ?Right: 50 cc for a total of 290 / 535 cc ? ? ?

## 2021-05-18 ENCOUNTER — Telehealth: Payer: Self-pay | Admitting: Oncology

## 2021-05-18 NOTE — Telephone Encounter (Signed)
Attempt made to reach patient to reschedule her missed appointment on 2/24. Left her a VM.  ?

## 2021-05-21 ENCOUNTER — Telehealth: Payer: Self-pay

## 2021-05-21 NOTE — Telephone Encounter (Signed)
-----   Message from Evelina Dun, RN sent at 05/18/2021 10:06 AM EST ----- ?Patient called in FEB to cancel appt. Scheduling tried to reach her again and she said she would call back to schedule when she is ready.  ? ?----- Message ----- ?From: Earlie Server, MD ?Sent: 05/16/2021   4:21 PM EST ?To: Evelina Dun, RN, Amy Unice Bailey, CMA ? ?She needs a follow up with me, MD only.  ? ? ?

## 2021-05-22 ENCOUNTER — Ambulatory Visit (INDEPENDENT_AMBULATORY_CARE_PROVIDER_SITE_OTHER): Payer: Medicare HMO | Admitting: Plastic Surgery

## 2021-05-22 ENCOUNTER — Encounter: Payer: Self-pay | Admitting: Plastic Surgery

## 2021-05-22 ENCOUNTER — Other Ambulatory Visit: Payer: Self-pay

## 2021-05-22 DIAGNOSIS — C50919 Malignant neoplasm of unspecified site of unspecified female breast: Secondary | ICD-10-CM

## 2021-05-22 MED ORDER — DOXYCYCLINE HYCLATE 100 MG PO TABS: 100.0000 mg | ORAL_TABLET | Freq: Two times a day (BID) | ORAL | 0 refills | Status: AC

## 2021-05-22 NOTE — Progress Notes (Signed)
? ?  Subjective:  ? ? Patient ID: Gabrielle Davidson, female    DOB: [birth year], 71 y.o.   MRN: [id number] ? ?The patient is a 71 year old female here for follow-up on her right breast reconstruction.  She had a 535 cc expander placed at the time of her right mastectomy.  She has been getting fills and tolerating it well.  She has had some redness on the lower flap and hot drainage in her tube.  That has improved markedly.  Her current volume is 290 cc.  The patient finished up the doxycycline within the last 2 days. ? ? ? ? ?Review of Systems  ?Constitutional: Negative.   ?Eyes: Negative.   ?Respiratory: Negative.    ?Cardiovascular: Negative.   ?Gastrointestinal: Negative.   ?Endocrine: Negative.   ?Genitourinary: Negative.   ?Neurological: Negative.   ?Hematological: Negative.   ?   ?Objective:  ? Physical Exam ?Constitutional:   ?   Appearance: Normal appearance.  ?Cardiovascular:  ?   Rate and Rhythm: Normal rate.  ?   Pulses: Normal pulses.  ?Pulmonary:  ?   Effort: Pulmonary effort is normal.  ?Neurological:  ?   Mental Status: She is alert and oriented to person, place, and time.  ?Psychiatric:     ?   Mood and Affect: Mood normal.     ?   Behavior: Behavior normal.     ?   Thought Content: Thought content normal.  ? ? ?   ?Assessment & Plan:  ? ?  ICD-10-CM   ?1. 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  ?  ?We placed injectable saline in the Expander using a sterile technique: ?Right: 70 cc for a total of 360 / 535 cc ? ?Removed the right drain. ?Will send in doxy just in case. ? ?We will move ahead with at least getting her on the schedule for removal and replacement of her expander with an implant.  We will do at least 2 more fills.  We will also plan to do a left breast mastopexy reduction at the same time. ?

## 2021-05-25 ENCOUNTER — Telehealth: Payer: Self-pay | Admitting: *Deleted

## 2021-05-25 NOTE — Telephone Encounter (Signed)
REPLACEMENT TISSUE EXPANDER W/PERMANENT IMPLANT, SUSPENSION OF BREAST, BREAST REDUCTION- Outpatient ?[facility] ?Date: 05/25/2021 ?[employer]~ [employer] ?CPT C50.919 Dx: [id number], [id number], [id number] ?PA submitted/ approved ?Certified in [place] Number: [id number] ? 06/25/2021- 11/25/2021 ?[employer], LPN/ [employer]  ?

## 2021-05-26 NOTE — Progress Notes (Deleted)
Patient is a pleasant 71 year old female with PMH of right-sided breast cancer s/p right-sided mastectomy with immediate reconstruction performed 04/08/2021 by Dr. Marla Roe who presents to clinic for postoperative follow-up.   ? ?She was last seen here in clinic on 05/22/2021.  At that time, saline was placed in expander for a total of 360/535 cc.  Her right-sided JP drain was removed and she was prescribed doxycycline.  There had been concern about redness inferiorly.  Plan was for at least two additional expander fills prior to implant exchange. ? ?Today, ? ? ?

## 2021-05-27 NOTE — Telephone Encounter (Signed)
Noted authorization approved under incorrect provider. Resubmitted with correct information.  ? ?REPLACEMENT TISSUE EXPANDER W/PERMANENT IMPLANT, SUSPENSION OF BREAST, BREAST REDUCTION- Outpatient ?[place] [facility] ?Date: 05/27/2021 ?[employer]~ [phone]~ telephone ?CPT C50.919 Dx: [id number], [id number], [id number] ?PA submitted/ approved ?Certified in [place] Number:  [id number] ?06/25/2021- 11/25/2021 ?[employer], LPN/ RSA  ?

## 2021-05-28 ENCOUNTER — Ambulatory Visit: Payer: Medicare HMO | Admitting: Physician Assistant

## 2021-06-04 ENCOUNTER — Other Ambulatory Visit: Payer: Self-pay

## 2021-06-04 ENCOUNTER — Ambulatory Visit (INDEPENDENT_AMBULATORY_CARE_PROVIDER_SITE_OTHER): Payer: Self-pay | Admitting: Physician Assistant

## 2021-06-04 DIAGNOSIS — Z9889 Other specified postprocedural states: Secondary | ICD-10-CM

## 2021-06-04 NOTE — H&P (View-Only) (Signed)
Patient is a 71 year old female with PMH of right-sided breast cancer s/p right-sided mastectomy with immediate reconstruction performed 04/08/2021 by Dr. Marla Roe who presents to clinic for postoperative follow-up. ? ?She was last seen here in clinic on 05/22/2021.  At that time, she had just finished a course of doxycycline for intermittent redness and concern for infection.  Exam was reassuring, but she was prescribed an additional course of doxycycline to err on the side of caution.  JP drain was removed.  Expander fill was performed for a total of 360/535 cc.  Plan was for at least 2 more fills prior to implant exchange.  Plan is also for her to have left-sided oncoplastic reduction and mastopexy at time of implant exchange. ? ?Today, patient is doing well.  States that her redness had not returned so she held off on the additional prescription of doxycycline.  She feels prepared for next expander fill.  Denies any significant pain symptoms at home. ? ?Physical exam is entirely reassuring.  Right-sided expander placement without evidence of erythema or other concerning findings otherwise concerning for infection.  No obvious subcutaneous fluid collections.  No wounds or dehiscence noted. ? ?We placed injectable saline in the Expander using a sterile technique: ?Right: 50 cc for a total of 410 / 535 cc ? ?Return to clinic in 10 to 14 days for possibly final expander fill as well as photos.  She will call clinic should she develop any questions or concerns in interim. ?

## 2021-06-04 NOTE — Progress Notes (Signed)
Patient is a 71 year old female with PMH of right-sided breast cancer s/p right-sided mastectomy with immediate reconstruction performed 04/08/2021 by Dr. Marla Roe who presents to clinic for postoperative follow-up. ? ?She was last seen here in clinic on 05/22/2021.  At that time, she had just finished a course of doxycycline for intermittent redness and concern for infection.  Exam was reassuring, but she was prescribed an additional course of doxycycline to err on the side of caution.  JP drain was removed.  Expander fill was performed for a total of 360/535 cc.  Plan was for at least 2 more fills prior to implant exchange.  Plan is also for her to have left-sided oncoplastic reduction and mastopexy at time of implant exchange. ? ?Today, patient is doing well.  States that her redness had not returned so she held off on the additional prescription of doxycycline.  She feels prepared for next expander fill.  Denies any significant pain symptoms at home. ? ?Physical exam is entirely reassuring.  Right-sided expander placement without evidence of erythema or other concerning findings otherwise concerning for infection.  No obvious subcutaneous fluid collections.  No wounds or dehiscence noted. ? ?We placed injectable saline in the Expander using a sterile technique: ?Right: 50 cc for a total of 410 / 535 cc ? ?Return to clinic in 10 to 14 days for possibly final expander fill as well as photos.  She will call clinic should she develop any questions or concerns in interim. ?

## 2021-06-12 ENCOUNTER — Encounter: Payer: Self-pay | Admitting: Surgical

## 2021-06-12 ENCOUNTER — Other Ambulatory Visit: Payer: Self-pay

## 2021-06-12 ENCOUNTER — Ambulatory Visit (INDEPENDENT_AMBULATORY_CARE_PROVIDER_SITE_OTHER): Payer: Medicare HMO | Admitting: Surgical

## 2021-06-12 VITALS — BP 146/89 | HR 60 | Ht 59.0 in | Wt 152.0 lb

## 2021-06-12 DIAGNOSIS — C50919 Malignant neoplasm of unspecified site of unspecified female breast: Secondary | ICD-10-CM

## 2021-06-12 DIAGNOSIS — C50411 Malignant neoplasm of upper-outer quadrant of right female breast: Secondary | ICD-10-CM

## 2021-06-12 DIAGNOSIS — Z9889 Other specified postprocedural states: Secondary | ICD-10-CM

## 2021-06-12 MED ORDER — CEPHALEXIN 500 MG PO CAPS: 500.0000 mg | ORAL_CAPSULE | Freq: Four times a day (QID) | ORAL | 0 refills | Status: AC

## 2021-06-12 MED ORDER — HYDROCODONE-ACETAMINOPHEN 5-325 MG PO TABS: 1.0000 | ORAL_TABLET | Freq: Four times a day (QID) | ORAL | 0 refills | Status: AC | PRN

## 2021-06-12 NOTE — Progress Notes (Signed)
? ?  Patient ID: Gabrielle Davidson, female    DOB: [birth year], 71 y.o.   MRN: [id number] ? ?Chief Complaint  ?Patient presents with  ? Pre-op Exam  ? ? ?  ICD-10-CM   ?1. S/P breast reconstruction  Z98.890   ?  ?2. Invasive carcinoma of breast ([place])  C50.919   ?  ?3. Malignant neoplasm of upper-outer quadrant of right female breast, unspecified estrogen receptor status ([place])  C50.411   ?  ? ? ?History of Present Illness: ?Gabrielle Davidson is a 71 y.o.  female  with a history of right breast cancer and subsequent right breast reconstruction with expander placement.  She presents for preoperative evaluation for upcoming procedure, removal of right breast tissue expander and placement of right breast implant, left breast reduction/mastopexy for symmetry, scheduled for 06/25/2021 with Dr. Marla Roe. ? ?The patient has not had problems with anesthesia. No history of DVT/PE.  No family history of DVT/PE.  No family or personal history of bleeding or clotting disorders.  Patient is not currently taking any blood thinners.  No history of CVA/MI.  ? ?Job: Pharmacist ? ?PMH Significant for: Right breast cancer, HLD, HTN, thyroid cancer ? ?She is on ASA 81 mg daily, she reports she previously stopped this prior to her mastectomy and she is planning to stop this again for 5 to 7 days prior to surgery.  She reports she is no longer taking anastrozole. ? ?Patient reports she is feeling well.  No recent changes to her health.  She is ready for exchange of her expander to implant and left breast reduction/mastopexy.  She would like an additional fill today to the right breast. ? ? ?Past Medical History: ?Allergies: ?No Known Allergies ? ?Current Medications: ? ?Current Outpatient Medications:  ?  amitriptyline (ELAVIL) 25 MG tablet, Take 25 mg by mouth at bedtime., Disp: , Rfl:  ?  Ascorbic Acid (VITAMIN C) 1000 MG tablet, Take 1,000 mg by mouth in the morning and at bedtime., Disp: , Rfl:  ?  aspirin EC 81 MG tablet, Take 1  tablet (81 mg total) by mouth at bedtime. Restart 09/23/20, Disp: 30 tablet, Rfl: 11 ?  Biotin 10000 MCG TABS, Take 10,000 mcg by mouth in the morning and at bedtime., Disp: , Rfl:  ?  Calcium Carbonate-Vitamin D3 600-400 MG-UNIT TABS, Take 1-2 tablets by mouth See admin instructions. 1 tablet in the morning and 2 tablets at bedtime, Disp: , Rfl:  ?  cephALEXin (KEFLEX) 500 MG capsule, Take 1 capsule (500 mg total) by mouth 4 (four) times daily for 5 days., Disp: 20 capsule, Rfl: 0 ?  chlorthalidone (HYGROTON) 25 MG tablet, Take 25 mg by mouth in the morning., Disp: , Rfl:  ?  diazepam (VALIUM) 2 MG tablet, Take 1 tablet (2 mg total) by mouth every 8 (eight) hours as needed for anxiety., Disp: 15 tablet, Rfl: 0 ?  diazepam (VALIUM) 2 MG tablet, Take 1 tablet (2 mg total) by mouth every 12 (twelve) hours as needed for anxiety., Disp: 30 tablet, Rfl: 0 ?  FLUoxetine (PROZAC) 40 MG capsule, Take 40 mg by mouth every morning. , Disp: , Rfl:  ?  gabapentin (NEURONTIN) 300 MG capsule, Take 300 mg by mouth in the morning and at bedtime., Disp: , Rfl:  ?  HYDROcodone-acetaminophen (NORCO) 5-325 MG tablet, Take 1 tablet by mouth every 6 (six) hours as needed for up to 5 days for severe pain., Disp: 20 tablet, Rfl: 0 ?  hydrOXYzine (  ATARAX) 10 MG tablet, Take 10 mg by mouth 3 (three) times daily as needed for itching., Disp: , Rfl:  ?  ibuprofen (ADVIL) 200 MG tablet, Take 400-800 mg by mouth every 8 (eight) hours as needed (PAIN.)., Disp: , Rfl:  ?  levothyroxine (SYNTHROID) 88 MCG tablet, Take 88 mcg by mouth daily before breakfast., Disp: , Rfl:  ?  losartan (COZAAR) 100 MG tablet, Take 100 mg by mouth in the morning., Disp: , Rfl:  ?  meclizine (ANTIVERT) 25 MG tablet, Take 25 mg by mouth 3 (three) times daily as needed for dizziness., Disp: , Rfl:  ?  Multiple Vitamin (MULTIVITAMIN WITH MINERALS) TABS tablet, Take 1 tablet by mouth daily., Disp: , Rfl:  ?  potassium chloride SA (KLOR-CON M) 20 MEQ tablet, Take 1 tablet  (20 mEq total) by mouth 2 (two) times daily., Disp: 60 tablet, Rfl: 0 ?  tiZANidine (ZANAFLEX) 4 MG tablet, Take 1 tablet (4 mg total) by mouth every 8 (eight) hours as needed for muscle spasms., Disp: 30 tablet, Rfl: 0 ?  vitamin B-12 (CYANOCOBALAMIN) 1000 MCG tablet, Take 1,000 mcg by mouth in the morning., Disp: , Rfl:  ? ?Past Medical Problems: ?Past Medical History:  ?Diagnosis Date  ? Breast cancer ([place])   ? Family history of breast cancer   ? Family history of colon cancer   ? Hepatitis   ? A-YEARS AGO  ? Hypercholesteremia   ? Hypertension   ? Hypothyroidism   ? Osteopenia of multiple sites   ? Statin-induced myositis   ? Thyroid cancer ([place])   ? ? ?Past Surgical History: ?Past Surgical History:  ?Procedure Laterality Date  ? BREAST BIOPSY Right 02/10/2021  ? [place] bx, heart marker, path pending  ? BREAST BIOPSY Right 02/18/2021  ? stereo bx, distortion "COIL" clip-path pending  ? BREAST RECONSTRUCTION WITH PLACEMENT OF TISSUE EXPANDER AND FLEX HD (ACELLULAR HYDRATED DERMIS) Right 04/08/2021  ? Procedure: RIGHT BREAST RECONSTRUCTION WITH PLACEMENT OF TISSUE EXPANDER AND FLEX HD (ACELLULAR HYDRATED DERMIS);  Surgeon: Wallace Going, DO;  Location: [facility];  Service: Plastics;  Laterality: Right;  ? LUMBAR LAMINECTOMY/ DECOMPRESSION WITH MET-RX Right 09/16/2020  ? Procedure: Right Lumbar five Sacral one Minimally  invasive Discectomy with Metrex;  Surgeon: Judith Part, MD;  Location: [facility];  Service: Neurosurgery;  Laterality: Right;  ? MASTECTOMY WITH AXILLARY LYMPH NODE DISSECTION Right 04/08/2021  ? Procedure: MASTECTOMY WITH AXILLARY LYMPH NODE DISSECTION;  Surgeon: Robert Bellow, MD;  Location: [facility];  Service: General;  Laterality: Right;  ? NO PAST SURGERIES    ? THYROIDECTOMY Bilateral 03/26/2015  ? Procedure: THYROIDECTOMY;  Surgeon: Clyde Canterbury, MD;  Location: [facility];  Service: ENT;  Laterality: Bilateral;  ? ? ?Social History: ?Social History  ? ?Socioeconomic History  ? Marital  status: Married  ?  Spouse name: Not on file  ? Number of children: Not on file  ? Years of education: Not on file  ? Highest education level: Not on file  ?Occupational History  ? Not on file  ?Tobacco Use  ? Smoking status: Former  ?  Packs/day: 1.00  ?  Years: 20.00  ?  Pack years: 20.00  ?  Types: Cigarettes  ?  Quit date: 03/21/1983  ?  Years since quitting: 38.2  ? Smokeless tobacco: Never  ?Vaping Use  ? Vaping Use: Never used  ?Substance and Sexual Activity  ? Alcohol use: No  ?  Comment: RARE  ? Drug use:  No  ? Sexual activity: Not Currently  ?Other Topics Concern  ? Not on file  ?Social History Narrative  ? Not on file  ? ?Social Determinants of Health  ? ?Financial Resource Strain: Not on file  ?Food Insecurity: Not on file  ?Transportation Needs: Not on file  ?Physical Activity: Not on file  ?Stress: Not on file  ?Social Connections: Not on file  ?Intimate Partner Violence: Not on file  ? ? ?Family History: ?Family History  ?Problem Relation Age of Onset  ? Alzheimer's disease Mother   ? Hernia Mother   ? Heart disease Father   ? Colon cancer Maternal Aunt   ?     d. under 57  ? Breast cancer Paternal Aunt 51  ? Heart attack Maternal Grandfather   ? Diabetes Paternal Grandmother   ? Breast cancer Cousin 62  ? ? ?Review of Systems: ?ROS ? ?Physical Exam: ?Vital Signs ?BP (!) 146/89 (BP Location: Right Arm, Patient Position: Sitting, Cuff Size: Normal)   Pulse 60   Ht 4' 11" (1.499 m)   Wt 152 lb (68.9 kg)   SpO2 99%   BMI 30.70 kg/m?  ? ?Physical Exam  ?Constitutional:   ?   General: Not in acute distress. ?   Appearance: Normal appearance. Not ill-appearing.  ?HENT:  ?   Head: Normocephalic and atraumatic.  ?Eyes:  ?   Pupils: Pupils are equal, round ?Neck:  ?   Musculoskeletal: Normal range of motion.  ?Cardiovascular:  ?   Rate and Rhythm: Normal rate ?   Pulses: Normal pulses.  ?Pulmonary:  ?   Effort: Pulmonary effort is normal. No respiratory distress.  ?Abdominal:  ?   General: Abdomen is flat.  There is no distension.  ?Breast: Right breast incision is intact, well-healed ?Musculoskeletal: Normal range of motion.  ?Skin: ?   General: Skin is warm and dry.  ?   Findings: No erythema or rash.  ?Neurolog

## 2021-06-12 NOTE — H&P (View-Only) (Signed)
? ?  Patient ID: Gabrielle Davidson, female    DOB: [birth year], 71 y.o.   MRN: [id number] ? ?Chief Complaint  ?Patient presents with  ? Pre-op Exam  ? ? ?  ICD-10-CM   ?1. S/P breast reconstruction  Z98.890   ?  ?2. Invasive carcinoma of breast ([place])  C50.919   ?  ?3. Malignant neoplasm of upper-outer quadrant of right female breast, unspecified estrogen receptor status ([place])  C50.411   ?  ? ? ?History of Present Illness: ?Gabrielle Davidson is a 71 y.o.  female  with a history of right breast cancer and subsequent right breast reconstruction with expander placement.  She presents for preoperative evaluation for upcoming procedure, removal of right breast tissue expander and placement of right breast implant, left breast reduction/mastopexy for symmetry, scheduled for 06/25/2021 with Dr. Marla Roe. ? ?The patient has not had problems with anesthesia. No history of DVT/PE.  No family history of DVT/PE.  No family or personal history of bleeding or clotting disorders.  Patient is not currently taking any blood thinners.  No history of CVA/MI.  ? ?Job: Pharmacist ? ?PMH Significant for: Right breast cancer, HLD, HTN, thyroid cancer ? ?She is on ASA 81 mg daily, she reports she previously stopped this prior to her mastectomy and she is planning to stop this again for 5 to 7 days prior to surgery.  She reports she is no longer taking anastrozole. ? ?Patient reports she is feeling well.  No recent changes to her health.  She is ready for exchange of her expander to implant and left breast reduction/mastopexy.  She would like an additional fill today to the right breast. ? ? ?Past Medical History: ?Allergies: ?No Known Allergies ? ?Current Medications: ? ?Current Outpatient Medications:  ?  amitriptyline (ELAVIL) 25 MG tablet, Take 25 mg by mouth at bedtime., Disp: , Rfl:  ?  Ascorbic Acid (VITAMIN C) 1000 MG tablet, Take 1,000 mg by mouth in the morning and at bedtime., Disp: , Rfl:  ?  aspirin EC 81 MG tablet, Take 1  tablet (81 mg total) by mouth at bedtime. Restart 09/23/20, Disp: 30 tablet, Rfl: 11 ?  Biotin 10000 MCG TABS, Take 10,000 mcg by mouth in the morning and at bedtime., Disp: , Rfl:  ?  Calcium Carbonate-Vitamin D3 600-400 MG-UNIT TABS, Take 1-2 tablets by mouth See admin instructions. 1 tablet in the morning and 2 tablets at bedtime, Disp: , Rfl:  ?  cephALEXin (KEFLEX) 500 MG capsule, Take 1 capsule (500 mg total) by mouth 4 (four) times daily for 5 days., Disp: 20 capsule, Rfl: 0 ?  chlorthalidone (HYGROTON) 25 MG tablet, Take 25 mg by mouth in the morning., Disp: , Rfl:  ?  diazepam (VALIUM) 2 MG tablet, Take 1 tablet (2 mg total) by mouth every 8 (eight) hours as needed for anxiety., Disp: 15 tablet, Rfl: 0 ?  diazepam (VALIUM) 2 MG tablet, Take 1 tablet (2 mg total) by mouth every 12 (twelve) hours as needed for anxiety., Disp: 30 tablet, Rfl: 0 ?  FLUoxetine (PROZAC) 40 MG capsule, Take 40 mg by mouth every morning. , Disp: , Rfl:  ?  gabapentin (NEURONTIN) 300 MG capsule, Take 300 mg by mouth in the morning and at bedtime., Disp: , Rfl:  ?  HYDROcodone-acetaminophen (NORCO) 5-325 MG tablet, Take 1 tablet by mouth every 6 (six) hours as needed for up to 5 days for severe pain., Disp: 20 tablet, Rfl: 0 ?  hydrOXYzine (  ATARAX) 10 MG tablet, Take 10 mg by mouth 3 (three) times daily as needed for itching., Disp: , Rfl:  ?  ibuprofen (ADVIL) 200 MG tablet, Take 400-800 mg by mouth every 8 (eight) hours as needed (PAIN.)., Disp: , Rfl:  ?  levothyroxine (SYNTHROID) 88 MCG tablet, Take 88 mcg by mouth daily before breakfast., Disp: , Rfl:  ?  losartan (COZAAR) 100 MG tablet, Take 100 mg by mouth in the morning., Disp: , Rfl:  ?  meclizine (ANTIVERT) 25 MG tablet, Take 25 mg by mouth 3 (three) times daily as needed for dizziness., Disp: , Rfl:  ?  Multiple Vitamin (MULTIVITAMIN WITH MINERALS) TABS tablet, Take 1 tablet by mouth daily., Disp: , Rfl:  ?  potassium chloride SA (KLOR-CON M) 20 MEQ tablet, Take 1 tablet  (20 mEq total) by mouth 2 (two) times daily., Disp: 60 tablet, Rfl: 0 ?  tiZANidine (ZANAFLEX) 4 MG tablet, Take 1 tablet (4 mg total) by mouth every 8 (eight) hours as needed for muscle spasms., Disp: 30 tablet, Rfl: 0 ?  vitamin B-12 (CYANOCOBALAMIN) 1000 MCG tablet, Take 1,000 mcg by mouth in the morning., Disp: , Rfl:  ? ?Past Medical Problems: ?Past Medical History:  ?Diagnosis Date  ? Breast cancer ([place])   ? Family history of breast cancer   ? Family history of colon cancer   ? Hepatitis   ? A-YEARS AGO  ? Hypercholesteremia   ? Hypertension   ? Hypothyroidism   ? Osteopenia of multiple sites   ? Statin-induced myositis   ? Thyroid cancer ([place])   ? ? ?Past Surgical History: ?Past Surgical History:  ?Procedure Laterality Date  ? BREAST BIOPSY Right 02/10/2021  ? [place] bx, heart marker, path pending  ? BREAST BIOPSY Right 02/18/2021  ? stereo bx, distortion "COIL" clip-path pending  ? BREAST RECONSTRUCTION WITH PLACEMENT OF TISSUE EXPANDER AND FLEX HD (ACELLULAR HYDRATED DERMIS) Right 04/08/2021  ? Procedure: RIGHT BREAST RECONSTRUCTION WITH PLACEMENT OF TISSUE EXPANDER AND FLEX HD (ACELLULAR HYDRATED DERMIS);  Surgeon: Wallace Going, DO;  Location: [facility];  Service: Plastics;  Laterality: Right;  ? LUMBAR LAMINECTOMY/ DECOMPRESSION WITH MET-RX Right 09/16/2020  ? Procedure: Right Lumbar five Sacral one Minimally  invasive Discectomy with Metrex;  Surgeon: Judith Part, MD;  Location: [facility];  Service: Neurosurgery;  Laterality: Right;  ? MASTECTOMY WITH AXILLARY LYMPH NODE DISSECTION Right 04/08/2021  ? Procedure: MASTECTOMY WITH AXILLARY LYMPH NODE DISSECTION;  Surgeon: Robert Bellow, MD;  Location: [facility];  Service: General;  Laterality: Right;  ? NO PAST SURGERIES    ? THYROIDECTOMY Bilateral 03/26/2015  ? Procedure: THYROIDECTOMY;  Surgeon: Clyde Canterbury, MD;  Location: [facility];  Service: ENT;  Laterality: Bilateral;  ? ? ?Social History: ?Social History  ? ?Socioeconomic History  ? Marital  status: Married  ?  Spouse name: Not on file  ? Number of children: Not on file  ? Years of education: Not on file  ? Highest education level: Not on file  ?Occupational History  ? Not on file  ?Tobacco Use  ? Smoking status: Former  ?  Packs/day: 1.00  ?  Years: 20.00  ?  Pack years: 20.00  ?  Types: Cigarettes  ?  Quit date: 03/21/1983  ?  Years since quitting: 38.2  ? Smokeless tobacco: Never  ?Vaping Use  ? Vaping Use: Never used  ?Substance and Sexual Activity  ? Alcohol use: No  ?  Comment: RARE  ? Drug use:  No  ? Sexual activity: Not Currently  ?Other Topics Concern  ? Not on file  ?Social History Narrative  ? Not on file  ? ?Social Determinants of Health  ? ?Financial Resource Strain: Not on file  ?Food Insecurity: Not on file  ?Transportation Needs: Not on file  ?Physical Activity: Not on file  ?Stress: Not on file  ?Social Connections: Not on file  ?Intimate Partner Violence: Not on file  ? ? ?Family History: ?Family History  ?Problem Relation Age of Onset  ? Alzheimer's disease Mother   ? Hernia Mother   ? Heart disease Father   ? Colon cancer Maternal Aunt   ?     d. under 57  ? Breast cancer Paternal Aunt 51  ? Heart attack Maternal Grandfather   ? Diabetes Paternal Grandmother   ? Breast cancer Cousin 62  ? ? ?Review of Systems: ?ROS ? ?Physical Exam: ?Vital Signs ?BP (!) 146/89 (BP Location: Right Arm, Patient Position: Sitting, Cuff Size: Normal)   Pulse 60   Ht 4' 11" (1.499 m)   Wt 152 lb (68.9 kg)   SpO2 99%   BMI 30.70 kg/m?  ? ?Physical Exam  ?Constitutional:   ?   General: Not in acute distress. ?   Appearance: Normal appearance. Not ill-appearing.  ?HENT:  ?   Head: Normocephalic and atraumatic.  ?Eyes:  ?   Pupils: Pupils are equal, round ?Neck:  ?   Musculoskeletal: Normal range of motion.  ?Cardiovascular:  ?   Rate and Rhythm: Normal rate ?   Pulses: Normal pulses.  ?Pulmonary:  ?   Effort: Pulmonary effort is normal. No respiratory distress.  ?Abdominal:  ?   General: Abdomen is flat.  There is no distension.  ?Breast: Right breast incision is intact, well-healed ?Musculoskeletal: Normal range of motion.  ?Skin: ?   General: Skin is warm and dry.  ?   Findings: No erythema or rash.  ?Neurolog

## 2021-06-16 ENCOUNTER — Encounter (HOSPITAL_BASED_OUTPATIENT_CLINIC_OR_DEPARTMENT_OTHER): Payer: Self-pay | Admitting: Plastic Surgery

## 2021-06-16 ENCOUNTER — Other Ambulatory Visit: Payer: Self-pay

## 2021-06-18 ENCOUNTER — Encounter (HOSPITAL_BASED_OUTPATIENT_CLINIC_OR_DEPARTMENT_OTHER)
Admission: RE | Admit: 2021-06-18 | Discharge: 2021-06-18 | Disposition: A | Discharge status: Home / Self Care | Payer: Medicare HMO | Source: Ambulatory Visit | Attending: Plastic Surgery | Admitting: Plastic Surgery

## 2021-06-18 DIAGNOSIS — Z01812 Encounter for preprocedural laboratory examination: Secondary | ICD-10-CM | POA: Insufficient documentation

## 2021-06-18 LAB — BASIC METABOLIC PANEL
Anion gap: 9 (ref 5–15)
BUN: 9 mg/dL (ref 8–23)
CO2: 24 mmol/L (ref 22–32)
Calcium: 10.1 mg/dL (ref 8.9–10.3)
Chloride: 105 mmol/L (ref 98–111)
Creatinine, Ser: 0.72 mg/dL (ref 0.44–1.00)
GFR, Estimated: >60 mL/min (ref >60)
Glucose, Bld: 96 mg/dL (ref 70–99)
Potassium: 4.2 mmol/L (ref 3.5–5.1)
Sodium: 138 mmol/L (ref 135–145)

## 2021-06-25 ENCOUNTER — Encounter (HOSPITAL_BASED_OUTPATIENT_CLINIC_OR_DEPARTMENT_OTHER): Payer: Self-pay | Admitting: Plastic Surgery

## 2021-06-25 ENCOUNTER — Ambulatory Visit (HOSPITAL_BASED_OUTPATIENT_CLINIC_OR_DEPARTMENT_OTHER): Payer: Medicare HMO | Admitting: Anesthesiology

## 2021-06-25 ENCOUNTER — Encounter (HOSPITAL_BASED_OUTPATIENT_CLINIC_OR_DEPARTMENT_OTHER)
Admission: RE | Disposition: A | Discharge status: Home / Self Care | Payer: Self-pay | Source: Home / Self Care | Attending: Plastic Surgery

## 2021-06-25 ENCOUNTER — Other Ambulatory Visit: Payer: Self-pay

## 2021-06-25 ENCOUNTER — Ambulatory Visit (HOSPITAL_BASED_OUTPATIENT_CLINIC_OR_DEPARTMENT_OTHER)
Admission: RE | Admit: 2021-06-25 | Discharge: 2021-06-25 | Disposition: A | Discharge status: Home / Self Care | Payer: Medicare HMO | Attending: Plastic Surgery | Admitting: Plastic Surgery

## 2021-06-25 DIAGNOSIS — Z853 Personal history of malignant neoplasm of breast: Secondary | ICD-10-CM | POA: Diagnosis not present

## 2021-06-25 DIAGNOSIS — Z9013 Acquired absence of bilateral breasts and nipples: Secondary | ICD-10-CM

## 2021-06-25 DIAGNOSIS — I10 Essential (primary) hypertension: Secondary | ICD-10-CM | POA: Insufficient documentation

## 2021-06-25 DIAGNOSIS — N6489 Other specified disorders of breast: Secondary | ICD-10-CM | POA: Diagnosis not present

## 2021-06-25 DIAGNOSIS — Z421 Encounter for breast reconstruction following mastectomy: Secondary | ICD-10-CM | POA: Diagnosis not present

## 2021-06-25 DIAGNOSIS — E785 Hyperlipidemia, unspecified: Secondary | ICD-10-CM | POA: Diagnosis not present

## 2021-06-25 DIAGNOSIS — Z87891 Personal history of nicotine dependence: Secondary | ICD-10-CM | POA: Diagnosis not present

## 2021-06-25 DIAGNOSIS — Z85850 Personal history of malignant neoplasm of thyroid: Secondary | ICD-10-CM | POA: Insufficient documentation

## 2021-06-25 DIAGNOSIS — N651 Disproportion of reconstructed breast: Secondary | ICD-10-CM | POA: Diagnosis not present

## 2021-06-25 DIAGNOSIS — C50411 Malignant neoplasm of upper-outer quadrant of right female breast: Secondary | ICD-10-CM | POA: Insufficient documentation

## 2021-06-25 DIAGNOSIS — E039 Hypothyroidism, unspecified: Secondary | ICD-10-CM | POA: Diagnosis not present

## 2021-06-25 DIAGNOSIS — Z791 Long term (current) use of non-steroidal anti-inflammatories (NSAID): Secondary | ICD-10-CM | POA: Insufficient documentation

## 2021-06-25 DIAGNOSIS — Z9889 Other specified postprocedural states: Secondary | ICD-10-CM | POA: Diagnosis not present

## 2021-06-25 HISTORY — PX: REMOVAL OF BILATERAL TISSUE EXPANDERS WITH PLACEMENT OF BILATERAL BREAST IMPLANTS: SHX6431

## 2021-06-25 HISTORY — PX: MASTOPEXY: SHX5358

## 2021-06-25 HISTORY — PX: BREAST REDUCTION SURGERY: SHX8

## 2021-06-25 SURGERY — REMOVAL, TISSUE EXPANDER, BREAST, BILATERAL, WITH BILATERAL IMPLANT IMPLANT INSERTION
Anesthesia: General | Site: Breast | Laterality: Right

## 2021-06-25 MED ORDER — ACETAMINOPHEN 500 MG PO TABS
1000.0000 mg | ORAL_TABLET | Freq: Once | ORAL | Status: AC
  Administered 2021-06-25: 1000 mg via ORAL

## 2021-06-25 MED ORDER — SODIUM CHLORIDE 0.9% FLUSH: 3.0000 mL | INTRAVENOUS | Status: DC | PRN

## 2021-06-25 MED ORDER — FENTANYL CITRATE (PF) 100 MCG/2ML IJ SOLN
INTRAMUSCULAR | Status: AC
  Filled 2021-06-25: qty 2

## 2021-06-25 MED ORDER — FENTANYL CITRATE (PF) 100 MCG/2ML IJ SOLN
25.0000 ug | INTRAMUSCULAR | Status: DC | PRN
  Administered 2021-06-25: 25 ug via INTRAVENOUS

## 2021-06-25 MED ORDER — EPHEDRINE SULFATE (PRESSORS) 50 MG/ML IJ SOLN
INTRAMUSCULAR | Status: DC | PRN
  Administered 2021-06-25: 15 mg via INTRAVENOUS
  Administered 2021-06-25: 10 mg via INTRAVENOUS

## 2021-06-25 MED ORDER — ACETAMINOPHEN 500 MG PO TABS
ORAL_TABLET | ORAL | Status: AC
  Filled 2021-06-25: qty 2

## 2021-06-25 MED ORDER — ATROPINE SULFATE 0.4 MG/ML IV SOLN
INTRAVENOUS | Status: AC
  Filled 2021-06-25: qty 1

## 2021-06-25 MED ORDER — KETAMINE HCL 50 MG/5ML IJ SOSY
PREFILLED_SYRINGE | INTRAMUSCULAR | Status: AC
  Filled 2021-06-25: qty 5

## 2021-06-25 MED ORDER — LIDOCAINE HCL (CARDIAC) PF 100 MG/5ML IV SOSY
PREFILLED_SYRINGE | INTRAVENOUS | Status: DC | PRN
  Administered 2021-06-25: 60 mg via INTRAVENOUS

## 2021-06-25 MED ORDER — ONDANSETRON HCL 4 MG/2ML IJ SOLN
INTRAMUSCULAR | Status: DC | PRN
  Administered 2021-06-25: 4 mg via INTRAVENOUS

## 2021-06-25 MED ORDER — CHLORHEXIDINE GLUCONATE CLOTH 2 % EX PADS: 6.0000 | MEDICATED_PAD | Freq: Once | CUTANEOUS | Status: DC

## 2021-06-25 MED ORDER — SODIUM CHLORIDE 0.9% FLUSH: 3.0000 mL | Freq: Two times a day (BID) | INTRAVENOUS | Status: DC

## 2021-06-25 MED ORDER — SUCCINYLCHOLINE CHLORIDE 200 MG/10ML IV SOSY
PREFILLED_SYRINGE | INTRAVENOUS | Status: AC
  Filled 2021-06-25: qty 10

## 2021-06-25 MED ORDER — SODIUM CHLORIDE 0.9 % IV SOLN
INTRAVENOUS | Status: AC
  Filled 2021-06-25: qty 10

## 2021-06-25 MED ORDER — DEXAMETHASONE SODIUM PHOSPHATE 4 MG/ML IJ SOLN
INTRAMUSCULAR | Status: DC | PRN
  Administered 2021-06-25: 10 mg via INTRAVENOUS

## 2021-06-25 MED ORDER — PROPOFOL 10 MG/ML IV BOLUS
INTRAVENOUS | Status: DC | PRN
  Administered 2021-06-25: 150 mg via INTRAVENOUS

## 2021-06-25 MED ORDER — OXYCODONE HCL 5 MG PO TABS: 5.0000 mg | ORAL_TABLET | ORAL | Status: DC | PRN

## 2021-06-25 MED ORDER — CEFAZOLIN SODIUM-DEXTROSE 2-4 GM/100ML-% IV SOLN
INTRAVENOUS | Status: AC
  Filled 2021-06-25: qty 100

## 2021-06-25 MED ORDER — CEFAZOLIN SODIUM-DEXTROSE 2-4 GM/100ML-% IV SOLN
2.0000 g | INTRAVENOUS | Status: AC
  Administered 2021-06-25: 2 g via INTRAVENOUS

## 2021-06-25 MED ORDER — EPHEDRINE 5 MG/ML INJ
INTRAVENOUS | Status: AC
  Filled 2021-06-25: qty 5

## 2021-06-25 MED ORDER — MIDAZOLAM HCL 5 MG/5ML IJ SOLN
INTRAMUSCULAR | Status: DC | PRN
  Administered 2021-06-25: 2 mg via INTRAVENOUS

## 2021-06-25 MED ORDER — MIDAZOLAM HCL 2 MG/2ML IJ SOLN
INTRAMUSCULAR | Status: AC
  Filled 2021-06-25: qty 2

## 2021-06-25 MED ORDER — OXYCODONE HCL 5 MG PO TABS
5.0000 mg | ORAL_TABLET | Freq: Once | ORAL | Status: AC
  Administered 2021-06-25: 5 mg via ORAL

## 2021-06-25 MED ORDER — FENTANYL CITRATE (PF) 100 MCG/2ML IJ SOLN
INTRAMUSCULAR | Status: DC | PRN
  Administered 2021-06-25 (×2): 50 ug via INTRAVENOUS

## 2021-06-25 MED ORDER — LACTATED RINGERS IV SOLN: INTRAVENOUS | Status: DC

## 2021-06-25 MED ORDER — ONDANSETRON HCL 4 MG/2ML IJ SOLN
INTRAMUSCULAR | Status: AC
  Filled 2021-06-25: qty 2

## 2021-06-25 MED ORDER — LIDOCAINE 2% (20 MG/ML) 5 ML SYRINGE
INTRAMUSCULAR | Status: AC
  Filled 2021-06-25: qty 5

## 2021-06-25 MED ORDER — PHENYLEPHRINE 40 MCG/ML (10ML) SYRINGE FOR IV PUSH (FOR BLOOD PRESSURE SUPPORT)
PREFILLED_SYRINGE | INTRAVENOUS | Status: AC
  Filled 2021-06-25: qty 10

## 2021-06-25 MED ORDER — KETAMINE HCL 10 MG/ML IJ SOLN
INTRAMUSCULAR | Status: DC | PRN
  Administered 2021-06-25: 30 mg via INTRAVENOUS
  Administered 2021-06-25: 20 mg via INTRAVENOUS

## 2021-06-25 MED ORDER — OXYCODONE HCL 5 MG PO TABS
ORAL_TABLET | ORAL | Status: AC
  Filled 2021-06-25: qty 1

## 2021-06-25 MED ORDER — SODIUM CHLORIDE 0.9 % IV SOLN
INTRAVENOUS | Status: DC | PRN
  Administered 2021-06-25: 500 mL

## 2021-06-25 MED ORDER — SODIUM CHLORIDE 0.9 % IV SOLN
250.0000 mL | INTRAVENOUS | Status: DC | PRN
Start: 2021-06-25 — End: 2021-06-25

## 2021-06-25 MED ORDER — LIDOCAINE-EPINEPHRINE 1 %-1:100000 IJ SOLN
INTRAMUSCULAR | Status: DC | PRN
  Administered 2021-06-25: 42 mL via INTRAMUSCULAR

## 2021-06-25 MED ORDER — DEXAMETHASONE SODIUM PHOSPHATE 10 MG/ML IJ SOLN
INTRAMUSCULAR | Status: AC
  Filled 2021-06-25: qty 1

## 2021-06-25 MED ORDER — FENTANYL CITRATE (PF) 100 MCG/2ML IJ SOLN
INTRAMUSCULAR | Status: AC
Start: 2021-06-25 — End: ?
  Filled 2021-06-25: qty 2

## 2021-06-25 SURGICAL SUPPLY — 58 items
ADH SKN CLS APL DERMABOND .7 (GAUZE/BANDAGES/DRESSINGS) ×4
BAG DECANTER FOR FLEXI CONT (MISCELLANEOUS) ×3 IMPLANT
BINDER BREAST XLRG (GAUZE/BANDAGES/DRESSINGS) ×1 IMPLANT
BLADE HEX COATED 2.75 (ELECTRODE) ×3 IMPLANT
BLADE KNIFE PERSONA 10 (BLADE) ×6 IMPLANT
BLADE SURG 10 STRL SS (BLADE) ×3 IMPLANT
BLADE SURG 15 STRL LF DISP TIS (BLADE) ×4 IMPLANT
BLADE SURG 15 STRL SS (BLADE) ×6
CANISTER SUCT 1200ML W/VALVE (MISCELLANEOUS) ×4 IMPLANT
COVER BACK TABLE 60X90IN (DRAPES) ×3 IMPLANT
COVER MAYO STAND STRL (DRAPES) ×3 IMPLANT
DERMABOND ADVANCED (GAUZE/BANDAGES/DRESSINGS) ×2
DERMABOND ADVANCED .7 DNX12 (GAUZE/BANDAGES/DRESSINGS) ×4 IMPLANT
DRAPE LAPAROSCOPIC ABDOMINAL (DRAPES) ×3 IMPLANT
DRSG OPSITE POSTOP 4X6 (GAUZE/BANDAGES/DRESSINGS) ×6 IMPLANT
DRSG PAD ABDOMINAL 8X10 ST (GAUZE/BANDAGES/DRESSINGS) ×6 IMPLANT
ELECT BLADE 4.0 EZ CLEAN MEGAD (MISCELLANEOUS) ×3
ELECT COATED BLADE 2.86 ST (ELECTRODE) ×1 IMPLANT
ELECT REM PT RETURN 9FT ADLT (ELECTROSURGICAL) ×3
ELECTRODE BLDE 4.0 EZ CLN MEGD (MISCELLANEOUS) ×2 IMPLANT
ELECTRODE REM PT RTRN 9FT ADLT (ELECTROSURGICAL) ×2 IMPLANT
FUNNEL KELLER 2 DISP (MISCELLANEOUS) ×1 IMPLANT
GAUZE SPONGE 4X4 12PLY STRL (GAUZE/BANDAGES/DRESSINGS) ×1 IMPLANT
GLOVE BIO SURGEON STRL SZ 6.5 (GLOVE) ×9 IMPLANT
GLOVE SURG ENC MOIS LTX SZ7 (GLOVE) ×3 IMPLANT
GLOVE SURG ENC TEXT LTX SZ7.5 (GLOVE) ×3 IMPLANT
GOWN STRL REUS W/ TWL LRG LVL3 (GOWN DISPOSABLE) ×6 IMPLANT
GOWN STRL REUS W/ TWL XL LVL3 (GOWN DISPOSABLE) ×2 IMPLANT
GOWN STRL REUS W/TWL LRG LVL3 (GOWN DISPOSABLE) ×9
GOWN STRL REUS W/TWL XL LVL3 (GOWN DISPOSABLE) ×3
IMPL BREAST GEL 535CC (Breast) IMPLANT
IMPLANT BREAST GEL 535CC (Breast) ×3 IMPLANT
NDL HYPO 25X1 1.5 SAFETY (NEEDLE) ×2 IMPLANT
NDL SAFETY ECLIPSE 18X1.5 (NEEDLE) ×2 IMPLANT
NEEDLE HYPO 18GX1.5 SHARP (NEEDLE) ×3
NEEDLE HYPO 25X1 1.5 SAFETY (NEEDLE) ×3 IMPLANT
NS IRRIG 1000ML POUR BTL (IV SOLUTION) ×3 IMPLANT
PACK BASIN DAY SURGERY FS (CUSTOM PROCEDURE TRAY) ×3 IMPLANT
PENCIL SMOKE EVACUATOR (MISCELLANEOUS) ×3 IMPLANT
SIZER BREAST REUSE 535CC (SIZER) ×3
SIZER BRST REUSE ULT HI 535CC (SIZER) IMPLANT
SLEEVE SCD COMPRESS KNEE MED (STOCKING) ×3 IMPLANT
SPONGE T-LAP 18X18 ~~LOC~~+RFID (SPONGE) ×6 IMPLANT
STRIP CLOSURE SKIN 1/2X4 (GAUZE/BANDAGES/DRESSINGS) ×2 IMPLANT
SUT MNCRL AB 4-0 PS2 18 (SUTURE) ×12 IMPLANT
SUT MON AB 3-0 SH 27 (SUTURE) ×15
SUT MON AB 3-0 SH27 (SUTURE) ×8 IMPLANT
SUT MON AB 5-0 PS2 18 (SUTURE) IMPLANT
SUT PDS 3-0 CT2 (SUTURE) ×9
SUT PDS II 3-0 CT2 27 ABS (SUTURE) ×4 IMPLANT
SUT SILK 3 0 PS 1 (SUTURE) IMPLANT
SYR BULB IRRIG 60ML STRL (SYRINGE) ×3 IMPLANT
SYR CONTROL 10ML LL (SYRINGE) ×3 IMPLANT
TOWEL GREEN STERILE FF (TOWEL DISPOSABLE) ×6 IMPLANT
TRAY DSU PREP LF (CUSTOM PROCEDURE TRAY) ×3 IMPLANT
TUBE CONNECTING 20X1/4 (TUBING) ×3 IMPLANT
UNDERPAD 30X36 HEAVY ABSORB (UNDERPADS AND DIAPERS) ×6 IMPLANT
YANKAUER SUCT BULB TIP NO VENT (SUCTIONS) ×3 IMPLANT

## 2021-06-25 NOTE — Transfer of Care (Signed)
Immediate Anesthesia Transfer of Care Note ? ?Patient: Irlanda Croghan Truett ? ?Procedure(s) Performed: REMOVAL OF RIGH TISSUE EXPANDERS WITH PLACEMENT OF RIGHT BREAST IMPLANTS (Right: Breast) ?MAMMARY REDUCTION  (BREAST) LEFT BREAST (Left: Breast) ?MASTOPEXY LEFT BREAST (Left: Breast) ? ?Patient Location: PACU ? ?Anesthesia Type:General ? ?Level of Consciousness: awake, alert , oriented, drowsy and patient cooperative ? ?Airway & Oxygen Therapy: Patient Spontanous Breathing and Patient connected to face mask oxygen ? ?Post-op Assessment: Report given to RN and Post -op Vital signs reviewed and stable ? ?Post vital signs: Reviewed and stable ? ?Last Vitals:  ?Vitals Value Taken Time  ?BP    ?Temp    ?Pulse 70 06/25/21 1238  ?Resp 13 06/25/21 1238  ?SpO2 100 % 06/25/21 1238  ?Vitals shown include unvalidated device data. ? ?Last Pain:  ?Vitals:  ? 06/25/21 0855  ?TempSrc: Oral  ?PainSc: 2   ?   ? ?  ? ?Complications: No notable events documented. ?

## 2021-06-25 NOTE — Anesthesia Postprocedure Evaluation (Signed)
Anesthesia Post Note ? ?Patient: Gabrielle Davidson ? ?Procedure(s) Performed: REMOVAL OF RIGH TISSUE EXPANDERS WITH PLACEMENT OF RIGHT BREAST IMPLANTS (Right: Breast) ?MAMMARY REDUCTION  (BREAST) LEFT BREAST (Left: Breast) ?MASTOPEXY LEFT BREAST (Left: Breast) ? ?  ? ?Patient location during evaluation: PACU ?Anesthesia Type: General ?Level of consciousness: awake and alert ?Pain management: pain level controlled ?Vital Signs Assessment: post-procedure vital signs reviewed and stable ?Respiratory status: spontaneous breathing, nonlabored ventilation, respiratory function stable and patient connected to nasal cannula oxygen ?Cardiovascular status: blood pressure returned to baseline and stable ?Postop Assessment: no apparent nausea or vomiting ?Anesthetic complications: no ? ? ?No notable events documented. ? ?Last Vitals:  ?Vitals:  ? 06/25/21 1245 06/25/21 1257  ?BP: (!) 120/41   ?Pulse: 71 68  ?Resp: 15 12  ?Temp:    ?SpO2: 100% 95%  ?  ?Last Pain:  ?Vitals:  ? 06/25/21 1303  ?TempSrc:   ?PainSc: 2   ? ? ?  ?  ?  ?  ?  ?  ? ?Leeanna Slaby L Otis Burress ? ? ? ? ?

## 2021-06-25 NOTE — Interval H&P Note (Signed)
History and Physical Interval Note: ? ?06/25/2021 ?9:39 AM ? ?Gabrielle Davidson  has presented today for surgery, with the diagnosis of Invasive carcinoma of breast.  The various methods of treatment have been discussed with the patient and family. After consideration of risks, benefits and other options for treatment, the patient has consented to  Procedure(s) with comments: ?REMOVAL OF RIGH TISSUE EXPANDERS WITH PLACEMENT OF RIGHT BREAST IMPLANTS (Right) - 1.5 hours ?MAMMARY REDUCTION  (BREAST) LEFT BREAST (Left) ?MASTOPEXY LEFT BREAST (Left) as a surgical intervention.  The patient's history has been reviewed, patient examined, no change in status, stable for surgery.  I have reviewed the patient's chart and labs.  Questions were answered to the patient's satisfaction.   ? ? ?Gabrielle Davidson ? ? ?

## 2021-06-25 NOTE — Anesthesia Preprocedure Evaluation (Addendum)
Anesthesia Evaluation  ?Patient identified by MRN, date of birth, ID band ?Patient awake ? ? ? ?Reviewed: ?Allergy & Precautions, NPO status , Patient's Chart, lab work & pertinent test results ? ?Airway ?Mallampati: II ? ?TM Distance: >3 FB ?Neck ROM: Full ? ? ? Dental ?no notable dental hx. ?(+) Teeth Intact, Dental Advisory Given ?  ?Pulmonary ?neg pulmonary ROS, former smoker,  ?  ?Pulmonary exam normal ?breath sounds clear to auscultation ? ? ? ? ? ? Cardiovascular ?hypertension, Pt. on medications ?Normal cardiovascular exam ?Rhythm:Regular Rate:Normal ? ? ?  ?Neuro/Psych ?negative neurological ROS ? negative psych ROS  ? GI/Hepatic ?negative GI ROS, (+) Hepatitis -, A  ?Endo/Other  ?Hypothyroidism  ? Renal/GU ?negative Renal ROS  ?negative genitourinary ?  ?Musculoskeletal ?negative musculoskeletal ROS ?(+)  ? Abdominal ?  ?Peds ? Hematology ?negative hematology ROS ?(+)   ?Anesthesia Other Findings ? ? Reproductive/Obstetrics ? ?  ? ? ? ? ? ? ? ? ? ? ? ? ? ?  ?  ? ? ? ? ? ? ? ?Anesthesia Physical ?Anesthesia Plan ? ?ASA: 2 ? ?Anesthesia Plan: General  ? ?Post-op Pain Management: Tylenol PO (pre-op)* and Ketamine IV*  ? ?Induction: Intravenous ? ?PONV Risk Score and Plan: 3 and Ondansetron, Dexamethasone and Midazolam ? ?Airway Management Planned: LMA ? ?Additional Equipment:  ? ?Intra-op Plan:  ? ?Post-operative Plan: Extubation in OR ? ?Informed Consent: I have reviewed the patients History and Physical, chart, labs and discussed the procedure including the risks, benefits and alternatives for the proposed anesthesia with the patient or authorized representative who has indicated his/her understanding and acceptance.  ? ? ? ?Dental advisory given ? ?Plan Discussed with: CRNA ? ?Anesthesia Plan Comments:   ? ? ? ? ? ? ?Anesthesia Quick Evaluation ? ?

## 2021-06-25 NOTE — Interval H&P Note (Signed)
History and Physical Interval Note: ? ?06/25/2021 ?9:39 AM ? ?Gabrielle Davidson  has presented today for surgery, with the diagnosis of Invasive carcinoma of breast.  The various methods of treatment have been discussed with the patient and family. After consideration of risks, benefits and other options for treatment, the patient has consented to  Procedure(s) with comments: ?REMOVAL OF RIGH TISSUE EXPANDERS WITH PLACEMENT OF RIGHT BREAST IMPLANTS (Right) - 1.5 hours ?MAMMARY REDUCTION  (BREAST) LEFT BREAST (Left) ?MASTOPEXY LEFT BREAST (Left) as a surgical intervention.  The patient's history has been reviewed, patient examined, no change in status, stable for surgery.  I have reviewed the patient's chart and labs.  Questions were answered to the patient's satisfaction.   ? ? ?Loel Lofty Sloka Volante ? ? ?

## 2021-06-25 NOTE — Discharge Instructions (Addendum)
INSTRUCTIONS FOR AFTER BREAST SURGERY   You will likely have some questions about what to expect following your operation.  The following information will help you and your family understand what to expect when you are discharged from the hospital.  Following these guidelines will help ensure a smooth recovery and reduce risks of complications.  Postoperative instructions include information on: diet, wound care, medications and physical activity.  AFTER SURGERY Expect to go home after the procedure.  In some cases, you may need to spend one night in the hospital for observation.  DIET Breast surgery does not require a specific diet.  However, the healthier you eat the better your body can start healing. It is important to increasing your protein intake.  This means limiting the foods with sugar and carbohydrates.  Focus on vegetables and some meat.  If you have any liposuction during your procedure be sure to drink water.  If your urine is bright yellow, then it is concentrated, and you need to drink more water.  As a general rule after surgery, you should have 8 ounces of water every hour while awake.  If you find you are persistently nauseated or unable to take in liquids let us know.  NO TOBACCO USE or EXPOSURE.  This will slow your healing process and increase the risk of a wound.  WOUND CARE Leave the ACE wrap or binder on for 3 days . Use fragrance free soap.   After 3 days you can remove the ACE wrap or binder to shower. Once dry apply ACE wrap, binder or sports bra.  Use a mild soap like Dial, Dove and Ivory. You may have Topifoam or Lipofoam on.  It is soft and spongy and helps keep you from getting creases if you have liposuction.  This can be removed before the shower and then replaced.  If you need more it is available on [employer] (Lipofoam). If you have steri-strips / tape directly attached to your skin leave them in place. It is OK to get these wet.   No baths, pools or hot tubs for four  weeks. We close your incision to leave the smallest and best-looking scar. No ointment or creams on your incisions until given the go ahead.  Especially not Neosporin (Too many skin reactions with this one).  A few weeks after surgery you can use Mederma and start massaging the scar. We ask you to wear your binder or sports bra for the first 6 weeks around the clock, including while sleeping. This provides added comfort and helps reduce the fluid accumulation at the surgery site.  ACTIVITY No heavy lifting until cleared by the doctor.  This usually means no more than a half-gallon of milk.  It is OK to walk and climb stairs. In fact, moving your legs is very important to decrease your risk of a blood clot.  It will also help keep you from getting deconditioned.  Every 1 to 2 hours get up and walk for 5 minutes. This will help with a quicker recovery back to normal.  Let pain be your guide so you don't do too much.  This is not the time for spring cleaning and don't plan on taking care of anyone else.  This time is for you to recover,  You will be more comfortable if you sleep and rest with your head elevated either with a few pillows under you or in a recliner.  No stomach sleeping for a three months.  WORK Everyone   returns to work at different times. As a rough guide, most people take at least 1 - 2 weeks off prior to returning to work. If you need documentation for your job, bring the forms to your postoperative follow up visit. ? ?DRIVING ?Arrange for someone to bring you home from the hospital.  You may be able to drive a few days after surgery but not while taking any narcotics or valium. ? ?BOWEL MOVEMENTS ?Constipation can occur after anesthesia and while taking pain medication.  It is important to stay ahead for your comfort.  We recommend taking Milk of Magnesia (2 tablespoons; twice a day) while taking the pain pills. ? ?MEDICATIONS You may be prescribed should start after surgery ?At your  preoperative visit for you history and physical you may have been given the following medications: ?An antibiotic: Start this medication when you get home and take according to the instructions on the bottle. ?Zofran 4 mg:  This is to treat nausea and vomiting.  You can take this every 6 hours as needed and only if needed. ?Valium 2 mg: This is for muscle tightness if you have an implant or expander. This will help relax your muscle which also helps with pain control.  This can be taken every 12 hours as needed. Don't drive after taking this medication. ?Norco (hydrocodone/acetaminophen) 5/325 mg:  This is only to be used after you have taken the motrin or the tylenol. Every 8 hours as needed. ? ? ?Over the counter Medication to take: ?Ibuprofen (Motrin) 600 mg:  Take this every 6 hours.  If you have additional pain then take 500 mg of the tylenol every 8 hours.  Only take the Norco after you have tried these two. ?Miralax or stool softener of choice: Take this according to the bottle if you take the Norco. ? ?WHEN TO CALL ?Call your surgeon's office if any of the following occur: ?Fever 101 degrees F or greater ?Excessive bleeding or fluid from the incision site. ?Pain that increases over time without aid from the medications ?Redness, warmth, or pus draining from incision sites ?Persistent nausea or inability to take in liquids ?Severe misshapen area that underwent the operation. ? ?Post Anesthesia Home Care Instructions ? ?Activity: ?Get plenty of rest for the remainder of the day. A responsible individual must stay with you for 24 hours following the procedure.  ?For the next 24 hours, DO NOT: ?-Drive a car ?-Paediatric nurse ?-Drink alcoholic beverages ?-Take any medication unless instructed by your physician ?-Make any legal decisions or sign important papers. ? ?Meals: ?Start with liquid foods such as gelatin or soup. Progress to regular foods as tolerated. Avoid greasy, spicy, heavy foods. If nausea and/or  vomiting occur, drink only clear liquids until the nausea and/or vomiting subsides. Call your physician if vomiting continues. ? ?Special Instructions/Symptoms: ?Your throat may feel dry or sore from the anesthesia or the breathing tube placed in your throat during surgery. If this causes discomfort, gargle with warm salt water. The discomfort should disappear within 24 hours. ? ?

## 2021-06-25 NOTE — Anesthesia Procedure Notes (Signed)
Procedure Name: LMA Insertion ?Date/Time: 06/25/2021 10:28 AM ?Performed by: Willa Frater, CRNA ?Pre-anesthesia Checklist: Patient identified, Emergency Drugs available, Suction available and Patient being monitored ?Patient Re-evaluated:Patient Re-evaluated prior to induction ?Oxygen Delivery Method: Circle system utilized ?Preoxygenation: Pre-oxygenation with 100% oxygen ?Induction Type: IV induction ?Ventilation: Mask ventilation without difficulty ?LMA: LMA inserted ?LMA Size: 4.0 ?Number of attempts: 1 ?Airway Equipment and Method: Bite block ?Placement Confirmation: positive ETCO2 ?Tube secured with: Tape ?Dental Injury: Teeth and Oropharynx as per pre-operative assessment  ? ? ? ? ?

## 2021-06-25 NOTE — Op Note (Addendum)
Op report Bilateral Exchange   DATE OF OPERATION: 06/25/2021  LOCATION: [facility]  SURGICAL DIVISION: Plastic Surgery  PREOPERATIVE DIAGNOSIS:  History of right breast cancer.  2. Acquired absence of bilateral breast.   POSTOPERATIVE DIAGNOSIS:  1. History of right breast cancer.  2. Acquired absence of bilateral breast.   PROCEDURE:  1. Right breast exchange of tissue expander for implant.  2. Left breast reduction after reconstruction 475 cc.  SURGEON: Ayda Tancredi Sanger Jabar Krysiak, DO  ASSISTANT: Roetta Sessions, PA  ANESTHESIA:  General.   COMPLICATIONS: None.   IMPLANTS: Right - Mentor Smooth Round Ultra High Profile Gel 535cc.   INDICATIONS FOR PROCEDURE:  The patient, Gabrielle Davidson, is a 71 y.o. female born on Nov 19, 1950, is here for treatment after a right mastectomy.  She had a tissue expander placed at the time of the mastectomy. She now presents for exchange of her expander for an implant.  She requires capsulotomies to better position the implant. MRN: [id number]  CONSENT:  Informed consent was obtained directly from the patient. Risks, benefits and alternatives were fully discussed. Specific risks including but not limited to bleeding, infection, hematoma, seroma, scarring, pain, implant infection, implant extrusion, capsular contracture, asymmetry, wound healing problems, and need for further surgery were all discussed. The patient did have an ample opportunity to have her questions answered to her satisfaction.   DESCRIPTION OF PROCEDURE:  The patient was taken to the operating room. SCDs were placed and IV antibiotics were given. The patient's chest was prepped and draped in a sterile fashion. A time out was performed and the implants to be used were identified.    Right breast: One percent Lidocaine with epinephrine was used to infiltrate at the incision site. The old mastectomy scar was incised laterally.  The mastectomy flaps from the  superior and inferior flaps were raised over the pectoralis major muscle for several centimeters to minimize tension for the closure. The expander had rotated sideways.  The ADM was well incorporated into the muscle but the muscle had split along the pectoralis muscle.  Inspection of the pocket showed a normal healthy capsule.  The ADM was no incorporated into the mastectomy flap.  The pocket was irrigated with antibiotic solution.  Medial and superior capsulotomies were performed to allow for breast pocket expansion.  Measurements were made and a sizer used to confirm adequate pocket size for the implant dimensions.  Hemostasis was ensured with electrocautery. New gloves were placed. The implant was soaked in antibiotic solution and then placed in the pocket and oriented appropriately. The pectoralis major muscle and capsule on the anterior surface were closed with a 3-0 PDS suture. The remaining skin was closed with 3-0 Monocryl deep dermal and 4-0 Monocryl subcuticular stitches.   Left breast:  Preoperative markings were confirmed.  Incision lines were injected with local with epinephrine.  After waiting for vasoconstriction, the marked lines were incised.  A Wise-pattern superomedial breast reduction was performed by de-epithelializing the pedicle, using bovie to create the superomedial pedicle, and removing breast tissue from the superior, lateral, and inferior portions of the breast.  Care was taken to not undermine the breast pedicle. Hemostasis was achieved.  The nipple was gently rotated into position and the soft tissue was closed with 4-0 Monocryl.  The patient was sat upright and size and shape symmetry was confirmed.  The pocket was irrigated and hemostasis confirmed.  The deep tissues were approximated with 3-0 PDS and 3-0 Monocryl sutures and the skin  was closed with deep dermal and subcuticular 4-0 Monocryl sutures.  Dermabond was applied.  A breast binder and ABDs were placed.  The nipple and skin  flaps had good capillary refill at the end of the procedure.  The patient tolerated the procedure well. The patient was allowed to wake from anesthesia and taken to the recovery room in satisfactory condition.  The advanced practice practitioner (APP) assisted throughout the case.  The APP was essential in retraction and counter traction when needed to make the case progress smoothly.  This retraction and assistance made it possible to see the tissue plans for the procedure.  The assistance was needed for blood control, tissue re-approximation and assisted with closure of the incision site.

## 2021-06-26 ENCOUNTER — Encounter (HOSPITAL_BASED_OUTPATIENT_CLINIC_OR_DEPARTMENT_OTHER): Payer: Self-pay | Admitting: Plastic Surgery

## 2021-06-26 NOTE — Progress Notes (Signed)
Left message stating courtesy call and if any questions or concerns please call the doctors office.  

## 2021-06-29 LAB — SURGICAL PATHOLOGY

## 2021-07-06 NOTE — Progress Notes (Signed)
Patient is a 71 year old female here for follow-up after removal of right breast expander and placement of right breast silicone implant and left breast reduction with Dr. Marla Roe on 06/25/2021.  She is 12 days postop. ? ?She had a Mentor smooth round ultrahigh profile gel 535 cc implant placed in the right breast. ? ?She reports overall she is doing well, does not report any pain.  She reports some itching as well as some irritation from the breast binder.  She has some questions about the size discrepancy of the left versus right breast.  She feels as if the left side is much larger. ? ?Chaperone present on exam ?On exam left breast incisions are intact, left NAC is viable.  She may have a subcutaneous fluid collection of the left breast, however no overlying skin changes.  The skin is not taut. ? ?Right breast incision is intact, healing well.  She has some ecchymosis noted medially.  There is no subcutaneous fluid collection noted.  Some irritation surrounding the lateral incision noted, appears to be a maculopapular rash, likely from the Dermabond or Steri-Strips. ? ?We discussed the size discrepancy may slightly improve over the next few weeks as she has some swelling in the left breast and likely a small fluid collection.  We did not attempt to aspirate today as I suspect her body will absorb the fluid over the next few weeks.  We can attempt aspiration at next appointment if she has signs of seroma at that time.  We discussed options for improving symmetry including fat grafting to right breast.  We discussed this would occur a few months postoperatively. ? ?Recommend following up in 2 to 3 weeks for reevaluation.  No signs of infection on exam.  Recommend calling with questions or concerns. ?

## 2021-07-07 ENCOUNTER — Ambulatory Visit (INDEPENDENT_AMBULATORY_CARE_PROVIDER_SITE_OTHER): Payer: Medicare HMO | Admitting: Surgical

## 2021-07-07 DIAGNOSIS — C50919 Malignant neoplasm of unspecified site of unspecified female breast: Secondary | ICD-10-CM

## 2021-07-07 DIAGNOSIS — C50411 Malignant neoplasm of upper-outer quadrant of right female breast: Secondary | ICD-10-CM

## 2021-07-07 DIAGNOSIS — Z9889 Other specified postprocedural states: Secondary | ICD-10-CM

## 2021-07-22 ENCOUNTER — Telehealth: Payer: Self-pay

## 2021-07-22 NOTE — Telephone Encounter (Addendum)
Called patient, LMVM. Inquired if she would like to move forward with blepharoplasty surgery with Dr. Erin Hearing. If so, call our office. Noted she has a follow up appointment with [facility] 07/29/2021 in regards to previous breast surgery. ?

## 2021-07-29 ENCOUNTER — Ambulatory Visit (INDEPENDENT_AMBULATORY_CARE_PROVIDER_SITE_OTHER): Payer: Medicare HMO | Admitting: Surgical

## 2021-07-29 DIAGNOSIS — C50411 Malignant neoplasm of upper-outer quadrant of right female breast: Secondary | ICD-10-CM

## 2021-07-29 DIAGNOSIS — Z9889 Other specified postprocedural states: Secondary | ICD-10-CM

## 2021-07-29 DIAGNOSIS — C50919 Malignant neoplasm of unspecified site of unspecified female breast: Secondary | ICD-10-CM

## 2021-07-29 NOTE — Progress Notes (Signed)
Patient is a 71 year old female here for follow-up after removal of right breast expander and placement of right breast silicone implant and left breast reduction with Dr. Marla Roe on 06/25/2021.  She is 1 month postop. ? ?She reports overall she is feeling well, has some itching to bilateral breast incisions.  She reports she has removed some of the sutures herself.  She is still bothered by the asymmetry, would like to have fat grafting as soon as possible.  She does not feel as if there has been much change over the past few weeks with swelling improving. ? ?Chaperone present on exam ?On exam bilateral breast incisions intact, left NAC is viable.  No subcutaneous fluid collection noted palpation.  No erythema or cellulitic changes. ?She is asymmetric, left side is larger. ? ?We discussed she can use scar creams at this time, continue compression 24/7 for 2 more weeks.  Continue with lifting restrictions.  No restrictions after 2 more weeks. ? ?Recommend following up in 4 to 6 weeks for reevaluation and to discuss fat grafting with Dr. Marla Roe. ?Pictures were obtained of the patient and placed in the chart with the patient's or guardian's permission. ? ?

## 2021-09-01 ENCOUNTER — Encounter: Payer: Self-pay | Admitting: Plastic Surgery

## 2021-09-01 ENCOUNTER — Ambulatory Visit: Payer: Medicare HMO | Admitting: Plastic Surgery

## 2021-09-01 DIAGNOSIS — Z719 Counseling, unspecified: Secondary | ICD-10-CM

## 2021-09-01 DIAGNOSIS — C50919 Malignant neoplasm of unspecified site of unspecified female breast: Secondary | ICD-10-CM

## 2021-09-01 NOTE — Progress Notes (Addendum)
   Subjective:    Patient ID: Gabrielle Davidson, female    DOB: July 05, 1950, 71 y.o.   MRN: [id number]  The patient is a 71 year old female here for further evaluation of her breasts.  The patient was diagnosed with breast cancer in 2022 and underwent a right mastectomy.  She had reconstruction and has a Mentor smooth round ultrahigh profile 535 gel implant in the right breast.  This was completed in April 2023.  The patient now has some asymmetry with some volume loss in the medial and superior medial aspect of the right breast that she would like improvement on.      Review of Systems  Constitutional: Negative.   Eyes: Negative.   Respiratory: Negative.    Cardiovascular: Negative.   Gastrointestinal: Negative.   Endocrine: Negative.   Genitourinary: Negative.        Objective:   Physical Exam Constitutional:      Appearance: Normal appearance.  Cardiovascular:     Rate and Rhythm: Normal rate.     Pulses: Normal pulses.  Pulmonary:     Effort: Pulmonary effort is normal.  Neurological:     Mental Status: She is alert and oriented to person, place, and time.  Psychiatric:        Mood and Affect: Mood normal.        Behavior: Behavior normal.        Thought Content: Thought content normal.        Assessment & Plan:     ICD-10-CM   1. Invasive carcinoma of breast ([place])  C50.919       Pictures were obtained of the patient and placed in the chart with the patient's or guardian's permission.  The patient would like to move forward with fat grafting for sometime in August.  This would be done to the right breast.  Plan for right breast lipo filling for symmetry for reconstruction

## 2021-09-16 ENCOUNTER — Telehealth: Payer: Self-pay | Admitting: Plastic Surgery

## 2021-09-16 NOTE — Telephone Encounter (Signed)
LVM to discuss surgery dates.

## 2021-09-29 ENCOUNTER — Other Ambulatory Visit: Payer: Self-pay | Admitting: Podiatry

## 2021-10-06 ENCOUNTER — Telehealth: Payer: Self-pay

## 2021-10-06 NOTE — Telephone Encounter (Signed)
[employer] Approval for [phone] Good through 10/13/21-03/19/2022 SX is scheduled 12/09/2021

## 2021-10-08 ENCOUNTER — Encounter
Admission: RE | Admit: 2021-10-08 | Discharge: 2021-10-08 | Disposition: A | Discharge status: Home / Self Care | Payer: Medicare HMO | Source: Ambulatory Visit | Attending: Podiatry | Admitting: Podiatry

## 2021-10-08 ENCOUNTER — Other Ambulatory Visit: Payer: Self-pay

## 2021-10-08 HISTORY — DX: Pneumonia, unspecified organism: J18.9

## 2021-10-08 NOTE — Patient Instructions (Addendum)
Your procedure is scheduled on: 10/15/21 - Thursday Report to the Registration Desk on the 1st floor of the [facility]. To find out your arrival time, please call [phone] between 1PM - 3PM on: 10/14/21 - Wednesday If your arrival time is 6:00 am, do not arrive prior to that time as the [facility] entrance doors do not open until 6:00 am.  REMEMBER: Instructions that are not followed completely may result in serious medical risk, up to and including death; or upon the discretion of your surgeon and anesthesiologist your surgery may need to be rescheduled.  Do not eat food after midnight the night before surgery.  No gum chewing, lozengers or hard candies.  You may however, drink CLEAR liquids up to 2 hours before you are scheduled to arrive for your surgery. Do not drink anything within 2 hours of your scheduled arrival time.  Clear liquids include: - water  - apple juice without pulp - gatorade (not RED colors) - black coffee or tea (Do NOT add milk or creamers to the coffee or tea) Do NOT drink anything that is not on this list  TAKE THESE MEDICATIONS THE MORNING OF SURGERY WITH A SIP OF WATER:  - FLUoxetine (PROZAC)  - gabapentin (NEURONTIN) - levothyroxine (SYNTHROID)  - potassium chloride SA   Follow recommendations from Cardiologist, Pulmonologist or PCP regarding stopping Aspirin, Coumadin, Plavix, Eliquis, Pradaxa, or Pletal. Do not take Aspirin on the day of surgery.  One week prior to surgery: Stop Anti-inflammatories (NSAIDS) such as Advil, Aleve, Ibuprofen, Motrin, Naproxen, Naprosyn and Aspirin based products such as Excedrin, Goodys Powder, BC Powder.   Stop ANY OVER THE COUNTER supplements until after surgery.Ascorbic Acid (VITAMIN C), Biotin, Calcium Carbonate-Vitamin D3, Multiple Vitamin.   metFORMIN (GLUCOPHAGE-XR)  - Stop taking  beginning 10/13/21 , may resume taking the day after surgery.  You may take Tylenol if needed for pain up until the day of  surgery.  No Alcohol for 24 hours before or after surgery.  No Smoking including e-cigarettes for 24 hours prior to surgery.  No chewable tobacco products for at least 6 hours prior to surgery.  No nicotine patches on the day of surgery.  Do not use any "recreational" drugs for at least a week prior to your surgery.  Please be advised that the combination of cocaine and anesthesia may have negative outcomes, up to and including death. If you test positive for cocaine, your surgery will be cancelled.  On the morning of surgery brush your teeth with toothpaste and water, you may rinse your mouth with mouthwash if you wish. Do not swallow any toothpaste or mouthwash.  Use CHG Soap or wipes as directed on instruction sheet.  Do not wear jewelry, make-up, hairpins, clips or nail polish.  Do not wear lotions, powders, or perfumes.   Do not shave body from the neck down 48 hours prior to surgery just in case you cut yourself which could leave a site for infection.  Also, freshly shaved skin may become irritated if using the CHG soap.  Contact lenses, hearing aids and dentures may not be worn into surgery.  Do not bring valuables to the hospital. [facility] is not responsible for any missing/lost belongings or valuables.   Notify your doctor if there is any change in your medical condition (cold, fever, infection).  Wear comfortable clothing (specific to your surgery type) to the hospital.  After surgery, you can help prevent lung complications by doing breathing exercises.  Take  deep breaths and cough every 1-2 hours. Your doctor may order a device called an Incentive Spirometer to help you take deep breaths. When coughing or sneezing, hold a pillow firmly against your incision with both hands. This is called "splinting." Doing this helps protect your incision. It also decreases belly discomfort.  If you are being admitted to the hospital overnight, leave your suitcase in the  car. After surgery it may be brought to your room.  If you are being discharged the day of surgery, you will not be allowed to drive home. You will need a responsible adult (18 years or older) to drive you home and stay with you that night.   If you are taking public transportation, you will need to have a responsible adult (18 years or older) with you. Please confirm with your physician that it is acceptable to use public transportation.   Please call the [facility] at [phone] if you have any questions about these instructions.  Surgery Visitation Policy:  Patients undergoing a surgery or procedure may have two family members or support persons with them as long as the person is not COVID-19 positive or experiencing its symptoms.   Inpatient Visitation:    Visiting hours are 7 a.m. to 8 p.m. Up to four visitors are allowed at one time in a patient room, including children. The visitors may rotate out with other people during the day. One designated support person (adult) may remain overnight.

## 2021-10-14 MED ORDER — CEFAZOLIN SODIUM-DEXTROSE 2-4 GM/100ML-% IV SOLN
2.0000 g | INTRAVENOUS | Status: AC
  Administered 2021-10-15: 2 g via INTRAVENOUS
  Filled 2021-10-14 (×2): qty 100

## 2021-10-14 MED ORDER — FAMOTIDINE 20 MG PO TABS
20.0000 mg | ORAL_TABLET | Freq: Once | ORAL | Status: AC
  Filled 2021-10-14 (×2): qty 1

## 2021-10-14 MED ORDER — ORAL CARE MOUTH RINSE
15.0000 mL | Freq: Once | OROMUCOSAL | Status: AC
  Filled 2021-10-14: qty 15

## 2021-10-14 MED ORDER — LACTATED RINGERS IV SOLN
INTRAVENOUS | Status: DC
  Filled 2021-10-14: qty 1000

## 2021-10-14 MED ORDER — CHLORHEXIDINE GLUCONATE 0.12 % MT SOLN
15.0000 mL | Freq: Once | OROMUCOSAL | Status: AC
  Filled 2021-10-14 (×2): qty 15

## 2021-10-15 ENCOUNTER — Other Ambulatory Visit: Payer: Self-pay

## 2021-10-15 ENCOUNTER — Ambulatory Visit: Payer: Medicare HMO

## 2021-10-15 ENCOUNTER — Ambulatory Visit: Payer: Medicare HMO | Admitting: Urgent Care

## 2021-10-15 ENCOUNTER — Encounter: Payer: Self-pay | Admitting: Podiatry

## 2021-10-15 ENCOUNTER — Encounter
Admission: RE | Disposition: A | Discharge status: Home / Self Care | Payer: Self-pay | Source: Home / Self Care | Attending: Podiatry

## 2021-10-15 ENCOUNTER — Ambulatory Visit
Admission: RE | Admit: 2021-10-15 | Discharge: 2021-10-15 | Disposition: A | Discharge status: Home / Self Care | Payer: Medicare HMO | Attending: Podiatry | Admitting: Podiatry

## 2021-10-15 DIAGNOSIS — M2041 Other hammer toe(s) (acquired), right foot: Secondary | ICD-10-CM | POA: Diagnosis present

## 2021-10-15 DIAGNOSIS — M205X1 Other deformities of toe(s) (acquired), right foot: Secondary | ICD-10-CM | POA: Insufficient documentation

## 2021-10-15 DIAGNOSIS — X58XXXA Exposure to other specified factors, initial encounter: Secondary | ICD-10-CM | POA: Diagnosis not present

## 2021-10-15 DIAGNOSIS — M216X1 Other acquired deformities of right foot: Secondary | ICD-10-CM | POA: Insufficient documentation

## 2021-10-15 DIAGNOSIS — M24374 Pathological dislocation of right foot, not elsewhere classified: Secondary | ICD-10-CM | POA: Insufficient documentation

## 2021-10-15 DIAGNOSIS — S93691A Other sprain of right foot, initial encounter: Secondary | ICD-10-CM | POA: Diagnosis not present

## 2021-10-15 HISTORY — PX: WEIL OSTEOTOMY: SHX5044

## 2021-10-15 HISTORY — PX: RECONSTRUCTION OF ANGULAR DEFORMITY,TOE: SHX6564

## 2021-10-15 SURGERY — OSTEOTOMY, WEIL
Anesthesia: General | Site: Toe | Laterality: Right

## 2021-10-15 MED ORDER — CEFAZOLIN SODIUM-DEXTROSE 2-4 GM/100ML-% IV SOLN
INTRAVENOUS | Status: AC
  Filled 2021-10-15: qty 100

## 2021-10-15 MED ORDER — LIDOCAINE HCL (PF) 1 % IJ SOLN
INTRAMUSCULAR | Status: AC
  Filled 2021-10-15: qty 30

## 2021-10-15 MED ORDER — OXYCODONE HCL 5 MG/5ML PO SOLN: 5.0000 mg | Freq: Once | ORAL | Status: AC | PRN

## 2021-10-15 MED ORDER — FAMOTIDINE 20 MG PO TABS
ORAL_TABLET | ORAL | Status: AC
  Administered 2021-10-15: 20 mg via ORAL
  Filled 2021-10-15: qty 1

## 2021-10-15 MED ORDER — BUPIVACAINE LIPOSOME 1.3 % IJ SUSP
INTRAMUSCULAR | Status: AC
  Filled 2021-10-15: qty 10

## 2021-10-15 MED ORDER — OXYCODONE-ACETAMINOPHEN 5-325 MG PO TABS: 1.0000 | ORAL_TABLET | Freq: Four times a day (QID) | ORAL | 0 refills | Status: AC | PRN

## 2021-10-15 MED ORDER — 0.9 % SODIUM CHLORIDE (POUR BTL) OPTIME
TOPICAL | Status: DC | PRN
  Administered 2021-10-15: 1000 mL

## 2021-10-15 MED ORDER — ONDANSETRON HCL 4 MG/2ML IJ SOLN
INTRAMUSCULAR | Status: DC | PRN
  Administered 2021-10-15: 4 mg via INTRAVENOUS

## 2021-10-15 MED ORDER — OXYCODONE HCL 5 MG PO TABS
5.0000 mg | ORAL_TABLET | Freq: Once | ORAL | Status: AC | PRN
  Administered 2021-10-15: 5 mg via ORAL

## 2021-10-15 MED ORDER — ASPIRIN 81 MG PO TBEC: 81.0000 mg | DELAYED_RELEASE_TABLET | Freq: Two times a day (BID) | ORAL | 0 refills | Status: AC

## 2021-10-15 MED ORDER — FENTANYL CITRATE (PF) 100 MCG/2ML IJ SOLN: 25.0000 ug | INTRAMUSCULAR | Status: DC | PRN

## 2021-10-15 MED ORDER — OXYCODONE HCL 5 MG PO TABS
ORAL_TABLET | ORAL | Status: AC
  Filled 2021-10-15: qty 1

## 2021-10-15 MED ORDER — FENTANYL CITRATE PF 50 MCG/ML IJ SOSY: 50.0000 ug | PREFILLED_SYRINGE | Freq: Once | INTRAMUSCULAR | Status: AC

## 2021-10-15 MED ORDER — CHLORHEXIDINE GLUCONATE 0.12 % MT SOLN
OROMUCOSAL | Status: AC
  Administered 2021-10-15: 15 mL via OROMUCOSAL
  Filled 2021-10-15: qty 15

## 2021-10-15 MED ORDER — MIDAZOLAM HCL 2 MG/2ML IJ SOLN
INTRAMUSCULAR | Status: AC
  Filled 2021-10-15: qty 2

## 2021-10-15 MED ORDER — FENTANYL CITRATE (PF) 100 MCG/2ML IJ SOLN
INTRAMUSCULAR | Status: AC
  Filled 2021-10-15: qty 2

## 2021-10-15 MED ORDER — BUPIVACAINE HCL (PF) 0.5 % IJ SOLN
INTRAMUSCULAR | Status: AC
  Filled 2021-10-15: qty 20

## 2021-10-15 MED ORDER — PROPOFOL 10 MG/ML IV BOLUS
INTRAVENOUS | Status: DC | PRN
  Administered 2021-10-15: 40 mg via INTRAVENOUS
  Administered 2021-10-15: 120 mg via INTRAVENOUS

## 2021-10-15 MED ORDER — BUPIVACAINE HCL (PF) 0.5 % IJ SOLN
INTRAMUSCULAR | Status: AC
  Filled 2021-10-15: qty 30

## 2021-10-15 MED ORDER — ONDANSETRON HCL 4 MG PO TABS: 4.0000 mg | ORAL_TABLET | Freq: Three times a day (TID) | ORAL | 0 refills | Status: DC | PRN

## 2021-10-15 MED ORDER — BUPIVACAINE LIPOSOME 1.3 % IJ SUSP
INTRAMUSCULAR | Status: DC | PRN
  Administered 2021-10-15: 10 mL

## 2021-10-15 MED ORDER — DEXAMETHASONE SODIUM PHOSPHATE 10 MG/ML IJ SOLN
INTRAMUSCULAR | Status: DC | PRN
  Administered 2021-10-15: 10 mg via INTRAVENOUS

## 2021-10-15 MED ORDER — LIDOCAINE HCL (CARDIAC) PF 100 MG/5ML IV SOSY
PREFILLED_SYRINGE | INTRAVENOUS | Status: DC | PRN
  Administered 2021-10-15: 50 mg via INTRAVENOUS

## 2021-10-15 MED ORDER — MIDAZOLAM HCL 2 MG/2ML IJ SOLN
INTRAMUSCULAR | Status: DC | PRN
  Administered 2021-10-15 (×2): 1 mg via INTRAVENOUS

## 2021-10-15 MED ORDER — ACETAMINOPHEN 10 MG/ML IV SOLN
INTRAVENOUS | Status: DC | PRN
  Administered 2021-10-15: 1000 mg via INTRAVENOUS

## 2021-10-15 MED ORDER — PROPOFOL 10 MG/ML IV BOLUS
INTRAVENOUS | Status: AC
Start: 2021-10-15 — End: ?
  Filled 2021-10-15: qty 20

## 2021-10-15 MED ORDER — GLYCOPYRROLATE 0.2 MG/ML IJ SOLN
INTRAMUSCULAR | Status: DC | PRN
  Administered 2021-10-15: .2 mg via INTRAVENOUS

## 2021-10-15 MED ORDER — FENTANYL CITRATE (PF) 100 MCG/2ML IJ SOLN: INTRAMUSCULAR | Status: DC | PRN

## 2021-10-15 MED ORDER — AMOXICILLIN-POT CLAVULANATE 500-125 MG PO TABS: 1.0000 | ORAL_TABLET | Freq: Three times a day (TID) | ORAL | 0 refills | Status: DC

## 2021-10-15 MED ORDER — FENTANYL CITRATE PF 50 MCG/ML IJ SOSY
PREFILLED_SYRINGE | INTRAMUSCULAR | Status: AC
  Administered 2021-10-15: 50 ug via INTRAVENOUS
  Filled 2021-10-15: qty 1

## 2021-10-15 MED ORDER — FENTANYL CITRATE (PF) 100 MCG/2ML IJ SOLN
INTRAMUSCULAR | Status: DC | PRN
  Administered 2021-10-15: 50 ug via INTRAVENOUS

## 2021-10-15 SURGICAL SUPPLY — 51 items
BLADE MED AGGRESSIVE (BLADE) ×1 IMPLANT
BLADE OSC/SAGITTAL 5.5X25 (BLADE) ×3 IMPLANT
BLADE SURG 15 STRL LF DISP TIS (BLADE) ×4 IMPLANT
BLADE SURG 15 STRL SS (BLADE) ×12
BNDG ELASTIC 4X5.8 VLCR NS LF (GAUZE/BANDAGES/DRESSINGS) ×4 IMPLANT
BNDG ESMARK 4X12 TAN STRL LF (GAUZE/BANDAGES/DRESSINGS) ×3 IMPLANT
BNDG GAUZE DERMACEA FLUFF (GAUZE/BANDAGES/DRESSINGS)
BNDG GAUZE DERMACEA FLUFF 4 (GAUZE/BANDAGES/DRESSINGS) ×2 IMPLANT
BNDG STRETCH GAUZE 3IN X12FT (GAUZE/BANDAGES/DRESSINGS) ×2 IMPLANT
COVER PIN YLW 0.028-062 (MISCELLANEOUS) ×1 IMPLANT
CUFF TOURN SGL QUICK 12 (TOURNIQUET CUFF) IMPLANT
CUFF TOURN SGL QUICK 18X4 (TOURNIQUET CUFF) IMPLANT
DRAPE FLUOR MINI C-ARM 54X84 (DRAPES) ×3 IMPLANT
DURAPREP 26ML APPLICATOR (WOUND CARE) ×3 IMPLANT
ELECT REM PT RETURN 9FT ADLT (ELECTROSURGICAL) ×3
ELECTRODE REM PT RTRN 9FT ADLT (ELECTROSURGICAL) ×2 IMPLANT
GAUZE SPONGE 4X4 12PLY STRL (GAUZE/BANDAGES/DRESSINGS) ×2 IMPLANT
GAUZE XEROFORM 1X8 LF (GAUZE/BANDAGES/DRESSINGS) ×2 IMPLANT
GLOVE BIO SURGEON STRL SZ7 (GLOVE) ×3 IMPLANT
GLOVE BIOGEL PI IND STRL 7.0 (GLOVE) ×2 IMPLANT
GLOVE BIOGEL PI INDICATOR 7.0 (GLOVE) ×1
GOWN STRL REUS W/ TWL LRG LVL3 (GOWN DISPOSABLE) ×4 IMPLANT
GOWN STRL REUS W/TWL LRG LVL3 (GOWN DISPOSABLE) ×4
IMP SYS CPR MINI SCORP (Miscellaneous) ×3 IMPLANT
IMPL SYS CPR MINI SCORP (Miscellaneous) IMPLANT
KIT TURNOVER KIT A (KITS) ×3 IMPLANT
LABEL OR SOLS (LABEL) ×3 IMPLANT
MANIFOLD NEPTUNE II (INSTRUMENTS) ×3 IMPLANT
NDL 1/2 TROCAR (NEEDLE) IMPLANT
NDL HYPO 25X1 1.5 SAFETY (NEEDLE) ×6 IMPLANT
NDL SAFETY ECLIPSE 18X1.5 (NEEDLE) ×2 IMPLANT
NEEDLE 1/2 TROCAR (NEEDLE) ×3 IMPLANT
NEEDLE HYPO 18GX1.5 SHARP (NEEDLE)
NEEDLE HYPO 25X1 1.5 SAFETY (NEEDLE) IMPLANT
NS IRRIG 1000ML POUR BTL (IV SOLUTION) ×1 IMPLANT
NS IRRIG 500ML POUR BTL (IV SOLUTION) ×2 IMPLANT
PACK EXTREMITY ARMC (MISCELLANEOUS) ×3 IMPLANT
PAD CAST 3X4 CTTN HI CHSV (CAST SUPPLIES) IMPLANT
PADDING CAST COTTON 3X4 STRL (CAST SUPPLIES) ×1
STRIP CLOSURE SKIN 1/4X4 (GAUZE/BANDAGES/DRESSINGS) ×2 IMPLANT
SUT ETHIBOND 2-0 (SUTURE) ×1 IMPLANT
SUT ETHILON 3-0 (SUTURE) ×2 IMPLANT
SUT VIC AB 2-0 SH 27 (SUTURE) ×1
SUT VIC AB 2-0 SH 27XBRD (SUTURE) IMPLANT
SUT VIC AB 4-0 FS2 27 (SUTURE) ×3 IMPLANT
SUT VICRYL AB 3-0 FS1 BRD 27IN (SUTURE) ×3 IMPLANT
SYR 10ML LL (SYRINGE) ×5 IMPLANT
SYSTEM IMPL FOREFOOT IB (Miscellaneous) ×1 IMPLANT
WATER STERILE IRR 500ML POUR (IV SOLUTION) ×2 IMPLANT
WIRE Z .045 C-WIRE SPADE TIP (WIRE) ×6 IMPLANT
WIRE Z .062 C-WIRE SPADE TIP (WIRE) ×4 IMPLANT

## 2021-10-15 NOTE — H&P (Signed)
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  The various methods of treatment have been discussed with the patient.  No guarantees were given.  After consideration of risks, benefits and other options for treatment, the patient has consented to surgery.  I have reviewed the patients' chart and labs.    PROCEDURE: ALL RIGHT FOOT HAMMER TOE REPAIR 2ND AND 3RD TOES POSSIBLE REPAIR OF PLANTAR PLATE 2ND MTPJ MTPJ CAPSULOTENDON BALANCING 2ND AND 3RD  2ND AND 3RD METATARSAL WEIL OSTEOTOMIES  A history and physical examination was performed in my office.  The patient was reexamined.  There have been no changes to this history and physical examination.  [name], DPM

## 2021-10-15 NOTE — Discharge Instructions (Addendum)
[facility]  POST OPERATIVE INSTRUCTIONS FOR DR. FOWLER AND [facility] PODIATRY [facility]PART[facility]   Take your medication as prescribed.  Pain medication should be taken only as needed.  Keep the dressing clean, dry and intact.  Keep your foot elevated above the heart level for the first 48 hours.  Walking to the bathroom and brief periods of walking are acceptable, unless we have instructed you to be non-weight bearing.  Always wear your post-op shoe when walking.  Always use your crutches if you are to be non-weight bearing.  Do not take a shower. Baths are permissible as long as the foot is kept out of the water.   Every hour you are awake:  Bend your knee 15 times. Massage calf 15 times  Call [facility] ([phone]) if any of the following problems occur: You develop a temperature or fever. The bandage becomes saturated with blood. Medication does not stop your pain. Injury of the foot occurs. Any symptoms of infection including redness, odor, or red streaks running from wound.   AMBULATORY SURGERY  DISCHARGE INSTRUCTIONS   The drugs that you were given will stay in your system until tomorrow so for the next 24 hours you should not:  Drive an automobile Make any legal decisions Drink any alcoholic beverage   You may resume regular meals tomorrow.  Today it is better to start with liquids and gradually work up to solid foods.  You may eat anything you prefer, but it is better to start with liquids, then soup and crackers, and gradually work up to solid foods.   Please notify your doctor immediately if you have any unusual bleeding, trouble breathing, redness and pain at the surgery site, drainage, fever, or pain not relieved by medication.    Additional Instructions: Please contact your physician with any problems or Same Day Surgery at [phone], Monday through Friday 6 am to 4 pm, or [place] at  [facility] number at [phone]. 

## 2021-10-15 NOTE — Op Note (Addendum)
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  Severe deviation/dislocation of the second metatarsal phalangeal joint medially. 2.  Articular cartilage damage to the second metatarsal phalangeal joint  PATHOLOGY/SPECIMEN(S): None  INDICATIONS:   Gabrielle Davidson is a 71 y.o. female who presents with severe pain to the second metatarsal phalangeal joint of the right foot.  Patient previously underwent a first metatarsal phalangeal joint replacement and subsequently ended up with medial drift of the second toe likely due to overload of the second metatarsal phalangeal joint.  She was told that she needed pain management for this.  Patient had an MRI taken which showed complete rupture of the lateral collateral ligament as well as severe synovitis to the second metatarsal phalange joint with bone marrow edema to the second metatarsal head.  To lesser extent had some mild marrow edema to the third metatarsal head.  On examination patient has dorsally medially deviated right second toe with pain on palpation of the second metatarsal phalangeal joint.  Has some mild drift of the third toe  as well medially.  Deformity is able to be reducible but does have a rigid contracture at the second PIPJ.  All treatment options were discussed with the patient both conservative and surgical attempts at correction clean potential risks and complications of surgical intervention.  All questions answered.  Consent obtained prior to procedure.  No guarantees given.  Discussed with patient that this is a very difficult procedure to perform due to the extent of dislocation of the second metatarsal phalangeal joint and could need subsequent surgery in the future if continues to have issues with this.  Patient has had multiple opinions on this and was instructed to seek out pain management but would like to try surgical intervention first to see if will help with her symptoms.  DESCRIPTION: After obtaining full informed written consent, the patient was brought back to the operating room and placed supine upon the operating table.  The patient received IV antibiotics prior to induction.  A popliteal nerve block was performed prior by anesthesia.  After obtaining adequate anesthesia, the patient was prepped and draped in the standard fashion.  An Esmarch bandage was used to exsanguinate the right lower extremity and pneumatic ankle tourniquet was inflated.  Attention was directed to the right second metatarsal phalangeal joint where a linear longitudinal incision was made over the second metatarsal joint extending from the distal shaft of the second metatarsal to the PIPJ of the second toe.  Blunt dissection was continued down and all venous contributories were cauterized as necessary retracting the neurovascular bundles as needed.  At this time a PIPJ incision was made into the extensor tendon and capsule of the PIPJ and the collateral ligaments were released thereby exposing the head of the proximal phalanx at the operative site.  The articular cartilage  was resected from the head of the proximal phalanx by removing  a small section of the bone with a sagittal saw and the cartilage from the base of the middle phalanx was resected with the curette and rongeur.  The extensor tendon was then dissected free proximally over the second metatarsal phalangeal joint area and the second metatarsal phalangeal joint was exposed.  There was a notable dislocation of the second digit medially on the second metatarsal head.  A capsular incision was made centrally and a large rush of inflamed synovium was exsanguinated from the joint.  The joint was inspected and noted to have notable cartilage damage to the dorsal medial aspect of the second metatarsal.  The capsular tissue was released medially releasing the medial structures and the lateral collateral ligaments did not appear to be intact and appeared to be completely ruptured.  Lachman's test was also performed and noted to have notable dislocation dorsally indicative of plantar plate disruption.  The plantar plate was visualized and did have a notable tear in the plantar lateral aspect of the plantar plate.  At this time sagittal bone saw was then used to create a distal second metatarsal Weil osteotomy.  The osteotomy was shortened approximately 5 to 6 mm and pinned intact.  The plantar plate was then visualized once again noted to have a tear at the lateral aspect of the plantar plate with the medial portion intact.  At this time the remaining portion of the plantar plate was released off the base the proximal phalanx of the second toe creating full-thickness flap.  The flexor tendon was able to be identified.  The pigtail suture technique from [employer] was then used to pass suture through the plantar plate.  Good bite was obtained from the proximal aspect of the plate with FiberWire.  At this time the distractor was removed.  The metatarsal head was rotated slightly medially and translated medially due to the deformity/subluxation that was present.  Once this was performed a 2.0 x 13 mm  cannulated Arthrex screw was then placed from dorsal to plantar across the osteotomy site with excellent compression noted.  Appeared to be well fixated with no rotation with stressing with the Soil scientist.  Once this was performed drill holes were then made into the proximal phalanx base in a crisscross pattern.  The suture was then brought through the holes from plantar to dorsal crossing and the toe was held in a rectus position to slightly plantarflexed while holding compression at the second metatarsal phalangeal joint.  The sutures were then tied on the top of the joint thereby repairing the plantar plate.  There still appeared to be a significant medial deviation of the toe despite the plantar plate repair so at this time it was determined to perform a lateral collateral ligament repair as well.  The lateral collateral structures appeared to be grossly disrupted to the lateral aspect of the joint.  The drill bit was then used to create a pilot hole from the medial aspect of the proximal phalanx directed laterally and slightly plantarly.  The same thing was then performed at the metatarsal neck area avoiding the osteotomy site.  The suture tape was then passed through the sites from medial to lateral distally and lateral to medial proximally.  Once the suture was held intact the Arthrex biotenodesis screw was then placed in the distal slot.  The suture was noted to be fairly loose in the slot even with the biotenodesis screw in place  so it was tied at the distal end to keep it from coming out of the slot further.  It appeared to be fairly well maintained.  The toe was then held in a rectus position and the metatarsal portion was tightened further and attempt was performed placing the Bio-Tenodesis screw from [employer] but it was to be unable to be removed from the driver.  Even off the operating table attempts were made to try to remove it from the driver but was not successful.  Unfortunately the company did  not bring another set so the end of the suture was held taut and the suture through the metatarsal head was sutured into the lateral metatarsal periosteum area and a portion of the extensor tendon while holding the toe in a rectus position and keeping it taut.  This appeared to stabilize the toe but the toe appeared to still slightly float dorsally and medially.  At this time it was determined to perform a flexor tendon transfer.  Dissection was continued about the lateral aspect of the second toe at the PIPJ where the flexor tendon was able to be visualized.  Flexor tendon was cut as far distally as possible.  The flexor tendon was then drawn from the plantar medial aspect of the toe under the extensor tendon to the dorsum of the foot.  While holding the toe in a rectus position the extensor tendon was then tied to the flexor tendon completing the flexor tendon transfer with 2-0 Ethibond suture.  The toe appeared to sit in a fairly rectus position at this point and had only slight medial deviation, great correction overall compared to preop.  The third toe appeared to be in a rectus position as well once the second toe was maintained but had slight medial deviation.  At this time a percutaneous incision was made over the third metatarsal phalangeal joint and a medial capsular release was performed percutaneously.  The percutaneous incision site was then reapproximated well coapted with 3-0 nylon.  At this time a 0.045 K wire was drilled through the middle phalanx and out the distal phalanx and then through the proximal phalanx while holding the toe in rectus position.  While holding the toe in a rectus position at the metatarsal phalangeal joint and further the pin was driven across the second metatarsal phalangeal joint for stabilization.  The wire was then bent and cut and a pin cap was placed.  C-arm imaging was utilized to verify placement of wire which appeared to be excellent.  The second toe appeared  to sit in a more rectus position overall compared to preoperative state.  The parabola appeared to be more maintained with the third metatarsal being slightly longer than the second but within normal limits.  The surgical site was flushed with copious amounts normal sterile saline.  The capsular and periosteal tissues were reapproximated well coapted with 3-0 Vicryl as well as the extensor tendon at the PIPJ.  The subcutaneous tissues were approximated well coapted with 3-0 Vicryl and the skin was then reapproximated well coapted with 3-0 nylon combination of simple and horizontal mattress type stitching.  10 cc of Exparel was then injected about the operative site.  A postoperative dressing was then applied consisting of Xeroform to the incisions followed by 4 x 4 gauze, Kling to splint the toes in the appropriate position plantarly and laterally, Kerlix, Webril, posterior splint, Ace wrap.  Patient tolerated the procedure and anesthesia well was transferred to recovery room vital signs  stable vascular status intact to all toes the right foot.  Patient will be discharged home with the appropriate orders and instructions.  Patient is to remain nonweightbearing at all times right lower extremity.  Postoperative medications also sent to pharmacy.   COMPLICATIONS: None  CONDITION: Good, stable  [name], DPM

## 2021-10-15 NOTE — Anesthesia Postprocedure Evaluation (Signed)
Anesthesia Post Note  Patient: Gabrielle Davidson  Procedure(s) Performed: 28308 - WEIL OSTEOTOMY - SECOND (Right) [phone] - RECONSTRUCTION hammertoe OVERLAPPING TOE - SECOND (Right: Toe)  Patient location during evaluation: PACU Anesthesia Type: General Level of consciousness: awake and alert Pain management: satisfactory to patient Vital Signs Assessment: post-procedure vital signs reviewed and stable Respiratory status: spontaneous breathing and respiratory function stable Cardiovascular status: stable Anesthetic complications: no   No notable events documented.   Last Vitals:  Vitals:   10/15/21 1030 10/15/21 1045  BP: 114/66 130/68  Pulse: 72   Resp: 11   Temp: 36.4 C 36.5 C  SpO2: 94%     Last Pain:  Vitals:   10/15/21 1051  TempSrc:   PainSc: 3                  VAN STAVEREN,Ketra Duchesne

## 2021-10-15 NOTE — Anesthesia Preprocedure Evaluation (Signed)
Anesthesia Evaluation  Patient identified by MRN, date of birth, ID band Patient awake    Reviewed: Allergy & Precautions, NPO status , Patient's Chart, lab work & pertinent test results  History of Anesthesia Complications Negative for: history of anesthetic complications  Airway Mallampati: II  TM Distance: >3 FB Neck ROM: full    Dental  (+) Teeth Intact   Pulmonary neg pulmonary ROS, neg shortness of breath, neg sleep apnea, neg COPD, neg recent URI, former smoker,    Pulmonary exam normal        Cardiovascular hypertension, (-) angina(-) CAD, (-) Past MI and (-) CABG negative cardio ROS Normal cardiovascular exam     Neuro/Psych negative neurological ROS  negative psych ROS   GI/Hepatic negative GI ROS, Neg liver ROS,   Endo/Other  Hypothyroidism   Renal/GU      Musculoskeletal   Abdominal   Peds  Hematology negative hematology ROS (+)   Anesthesia Other Findings Past Medical History: No date: Breast cancer ([place]) No date: Family history of breast cancer No date: Family history of colon cancer No date: Hepatitis     Comment:  A-YEARS AGO No date: Hypercholesteremia No date: Hypertension No date: Hypothyroidism No date: Osteopenia of multiple sites No date: Pneumonia 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 No date: Thyroid cancer [facility])  Past Surgical History: 02/10/2021: BREAST BIOPSY; Right     Comment:  [place] bx, heart marker, path pending 02/18/2021: BREAST BIOPSY; Right     Comment:  stereo bx, distortion "COIL" clip-path pending 04/08/2021: BREAST RECONSTRUCTION WITH PLACEMENT OF TISSUE EXPANDER  AND FLEX HD (ACELLULAR HYDRATED DERMIS); Right     Comment:  Procedure: RIGHT BREAST RECONSTRUCTION WITH PLACEMENT OF              TISSUE EXPANDER AND FLEX HD (ACELLULAR HYDRATED DERMIS);               Surgeon: Wallace Going, DO;  Location: [facility];                Service: Plastics;  Laterality:  Right; 06/25/2021: BREAST REDUCTION SURGERY; Left     Comment:  Procedure: MAMMARY REDUCTION  (BREAST) LEFT BREAST;                Surgeon: Wallace Going, DO;  Location: [place];  Service: Plastics;  Laterality: Left; No date: COLONOSCOPY W/ POLYPECTOMY 09/16/2020: LUMBAR LAMINECTOMY/ DECOMPRESSION WITH MET-RX; Right     Comment:  Procedure: Right Lumbar five Sacral one Minimally                invasive Discectomy with Metrex;  Surgeon: Judith Part, MD;  Location: [place];  Service: Neurosurgery;                Laterality: Right; 04/08/2021: MASTECTOMY WITH AXILLARY LYMPH NODE DISSECTION; Right     Comment:  Procedure: MASTECTOMY WITH AXILLARY LYMPH NODE               DISSECTION;  Surgeon: Robert Bellow, MD;  Location:               [facility];  Service: General;  Laterality: Right; 06/25/2021: MASTOPEXY; Left     Comment:  Procedure: MASTOPEXY LEFT BREAST;  Surgeon: Marla Roe,  Loel Lofty, DO;  Location: [place];                Service: Plastics;  Laterality: Left; 06/25/2021: REMOVAL OF BILATERAL TISSUE EXPANDERS WITH PLACEMENT OF  BILATERAL BREAST IMPLANTS; Right     Comment:  Procedure: REMOVAL OF RIGH TISSUE EXPANDERS WITH               PLACEMENT OF RIGHT BREAST IMPLANTS;  Surgeon: Wallace Going, DO;  Location: [place];                Service: Plastics;  Laterality: Right;  1.5 hours 03/26/2015: THYROIDECTOMY; Bilateral     Comment:  Procedure: THYROIDECTOMY;  Surgeon: Clyde Canterbury, MD;                Location: [facility];  Service: ENT;  Laterality:               Bilateral; No date: WISDOM TOOTH EXTRACTION  BMI    Body Mass Index: 30.08 kg/m      Reproductive/Obstetrics negative OB ROS                             Anesthesia Physical Anesthesia Plan  ASA: 2  Anesthesia Plan: General   Post-op Pain Management:  Regional block*   Induction: Intravenous  PONV Risk Score and Plan: 3 and Dexamethasone, Ondansetron, Midazolam and Treatment may vary due to age or medical condition  Airway Management Planned: LMA  Additional Equipment:   Intra-op Plan:   Post-operative Plan: Extubation in OR  Informed Consent: I have reviewed the patients History and Physical, chart, labs and discussed the procedure including the risks, benefits and alternatives for the proposed anesthesia with the patient or authorized representative who has indicated his/her understanding and acceptance.     Dental Advisory Given  Plan Discussed with: Anesthesiologist, CRNA and Surgeon  Anesthesia Plan Comments: (Patient consented for risks of anesthesia including but not limited to:  - adverse reactions to medications - damage to eyes, teeth, lips or other oral mucosa - nerve damage due to positioning  - sore throat or hoarseness - Damage to heart, brain, nerves, lungs, other parts of body or loss of life  Patient voiced understanding.)        Anesthesia Quick Evaluation

## 2021-10-15 NOTE — Anesthesia Procedure Notes (Addendum)
Anesthesia Regional Block: Popliteal block   Pre-Anesthetic Checklist: , timeout performed,  Correct Patient, Correct Site, Correct Laterality,  Correct Procedure, Correct Position, site marked,  Risks and benefits discussed,  Surgical consent,  Pre-op evaluation,  At surgeon's request and post-op pain management  Laterality: Lower  Prep: chloraprep       Needles:  Injection technique: Single-shot  Needle Type: Echogenic Needle     Needle Length: 9cm  Needle Gauge: 21     Additional Needles:   Procedures:,,,, ultrasound used (permanent image in chart),,    Narrative:  Start time: 10/15/2021 7:23 AM End time: 10/15/2021 7:26 AM Injection made incrementally with aspirations every 5 mL.  Performed by: Personally  Anesthesiologist: Dimas Millin, MD  Additional Notes: Patient's chart reviewed and they were deemed appropriate candidate for procedure, at surgeon's request. Patient educated about risks, benefits, and alternatives of the block including but not limited to: temporary or permanent nerve damage, bleeding, infection, damage to surround tissues, block failure, local anesthetic toxicity. Patient expressed understanding. A formal time-out was conducted consistent with institution rules.  Monitors were applied, and minimal sedation used. The site was prepped with skin prep and allowed to dry, and sterile gloves were used. A high frequency linear ultrasound probe with probe cover was utilized throughout. Popliteal artery pulsatile and visualized in popliteal fossa along with adjacent sciatic nerve and its branch point, which appeared anatomically normal, local anesthetic injected around them just proximal to the branch point, and echogenic block needle trajectory was monitored throughout. Aspiration performed every 78m. Blood vessels were avoided. All injections were performed without resistance and free of blood and paresthesias. The patient tolerated the procedure  well.  Injectate: 20cc of 0.5% bupivacaine

## 2021-10-15 NOTE — Transfer of Care (Signed)
Immediate Anesthesia Transfer of Care Note  Patient: Gabrielle Davidson  Procedure(s) Performed: 28308 - WEIL OSTEOTOMY - SECOND (Right) [phone] - RECONSTRUCTION hammertoe OVERLAPPING TOE - SECOND (Right: Toe)  Patient Location: PACU  Anesthesia Type:General  Level of Consciousness: awake  Airway & Oxygen Therapy: Patient Spontanous Breathing  Post-op Assessment: Report given to RN and Post -op Vital signs reviewed and stable  Post vital signs: Reviewed and stable  Last Vitals:  Vitals Value Taken Time  BP 132/69 10/15/21 1024  Temp    Pulse 70 10/15/21 1026  Resp 10 10/15/21 1026  SpO2 94 % 10/15/21 1026  Vitals shown include unvalidated device data.  Last Pain:  Vitals:   10/15/21 0643  TempSrc: Oral  PainSc: 0-No pain      Patients Stated Pain Goal: 0 (15/86/82 5749)  Complications: No notable events documented.

## 2021-10-16 ENCOUNTER — Encounter: Payer: Self-pay | Admitting: Podiatry

## 2021-11-23 ENCOUNTER — Other Ambulatory Visit: Payer: Self-pay | Admitting: General Surgery

## 2021-11-23 DIAGNOSIS — Z170 Estrogen receptor positive status [ER+]: Secondary | ICD-10-CM

## 2021-11-27 ENCOUNTER — Encounter: Payer: Medicare HMO | Admitting: Surgical

## 2021-12-18 ENCOUNTER — Encounter: Payer: Medicare HMO | Admitting: Surgical

## 2022-01-01 ENCOUNTER — Encounter: Payer: Medicare HMO | Admitting: Plastic Surgery

## 2022-01-04 ENCOUNTER — Ambulatory Visit
Admission: RE | Admit: 2022-01-04 | Discharge: 2022-01-04 | Disposition: A | Discharge status: Home / Self Care | Payer: Medicare HMO | Source: Ambulatory Visit | Attending: General Surgery | Admitting: General Surgery

## 2022-01-04 DIAGNOSIS — C50411 Malignant neoplasm of upper-outer quadrant of right female breast: Secondary | ICD-10-CM | POA: Insufficient documentation

## 2022-01-04 DIAGNOSIS — Z1231 Encounter for screening mammogram for malignant neoplasm of breast: Secondary | ICD-10-CM | POA: Diagnosis not present

## 2022-01-04 DIAGNOSIS — Z9011 Acquired absence of right breast and nipple: Secondary | ICD-10-CM | POA: Insufficient documentation

## 2022-01-04 DIAGNOSIS — Z170 Estrogen receptor positive status [ER+]: Secondary | ICD-10-CM | POA: Insufficient documentation

## 2022-01-05 ENCOUNTER — Ambulatory Visit (INDEPENDENT_AMBULATORY_CARE_PROVIDER_SITE_OTHER): Payer: Medicare HMO | Admitting: Surgical

## 2022-01-05 ENCOUNTER — Encounter: Payer: Self-pay | Admitting: Surgical

## 2022-01-05 VITALS — BP 144/72 | HR 70 | Ht <= 58 in | Wt 142.4 lb

## 2022-01-05 DIAGNOSIS — C50919 Malignant neoplasm of unspecified site of unspecified female breast: Secondary | ICD-10-CM

## 2022-01-05 DIAGNOSIS — Z9889 Other specified postprocedural states: Secondary | ICD-10-CM

## 2022-01-05 MED ORDER — OXYCODONE HCL 5 MG PO TABS: 5.0000 mg | ORAL_TABLET | Freq: Four times a day (QID) | ORAL | 0 refills | Status: AC | PRN

## 2022-01-05 MED ORDER — CEPHALEXIN 500 MG PO CAPS: 500.0000 mg | ORAL_CAPSULE | Freq: Four times a day (QID) | ORAL | 0 refills | Status: AC

## 2022-01-05 NOTE — H&P (View-Only) (Signed)
   Patient ID: Gabrielle Davidson, female    DOB: 06/23/1950, 71 y.o.   MRN: [id number]  Chief Complaint  Patient presents with   Pre-op Exam      ICD-10-CM   1. Invasive carcinoma of breast (HCC)  C50.919     2. S/P breast reconstruction  Z98.890       History of Present Illness: Gabrielle Davidson is a 71 y.o.  female  with a history of right breast reconstruction.  She presents for preoperative evaluation for upcoming procedure, liposuction for lipo filling of right breast, scheduled for 01/14/2022 with Dr. Dillingham.  The patient has not had problems with anesthesia. No history of DVT/PE.  No family history of DVT/PE.  No family or personal history of bleeding or clotting disorders.  Patient is not currently taking any blood thinners.  No history of CVA/MI.   Summary of Previous Visit: Patient with right breast silicone implant in place, left breast reduction on 06/25/2021 with Dr. Dillingham.  She has a 535 cc Mentor smooth round ultrahigh profile gel implant in place on the right.  Job: Pharmacist  PMH Significant for: Right breast cancer, hyperlipidemia, hypertension, on appetite suppressants.  Patient is currently taking Phendimetrazine for weight loss.  She is aware to hold this 1 week prior to surgery.  She is also on ASA 81 mg daily for preventative reasons.  She is going to also stop this 7 days prior to surgery.  She takes ibuprofen occasionally.  She denies any recent changes to her health.  She reports that she read through the consent form and has some questions.  She does report that she has had liposuction in the past, approximately 10 years ago.  She reports that she was told there was difficulty obtaining fat and that her fat is "around her organs".  Past Medical History: Allergies: No Known Allergies  Current Medications:  Current Outpatient Medications:    amitriptyline (ELAVIL) 25 MG tablet, Take 25 mg by mouth at bedtime., Disp: , Rfl:     amoxicillin-clavulanate (AUGMENTIN) 500-125 MG tablet, Take 1 tablet (500 mg total) by mouth 3 (three) times daily., Disp: 21 tablet, Rfl: 0   Ascorbic Acid (VITAMIN C) 1000 MG tablet, Take 1,000 mg by mouth in the morning and at bedtime., Disp: , Rfl:    Biotin 10000 MCG TABS, Take 10,000 mcg by mouth in the morning and at bedtime., Disp: , Rfl:    Calcium Carbonate-Vitamin D3 600-400 MG-UNIT TABS, Take 1-2 tablets by mouth See admin instructions. 1 tablet in the morning and 2 tablets at bedtime, Disp: , Rfl:    cephALEXin (KEFLEX) 500 MG capsule, Take 1 capsule (500 mg total) by mouth 4 (four) times daily for 5 days., Disp: 20 capsule, Rfl: 0   chlorthalidone (HYGROTON) 25 MG tablet, Take 25 mg by mouth in the morning., Disp: , Rfl:    ezetimibe (ZETIA) 10 MG tablet, Take 10 mg by mouth daily., Disp: , Rfl:    FLUoxetine (PROZAC) 40 MG capsule, Take 40 mg by mouth every morning. , Disp: , Rfl:    gabapentin (NEURONTIN) 300 MG capsule, Take 600 mg by mouth 2 (two) times daily., Disp: , Rfl:    hydrOXYzine (ATARAX) 10 MG tablet, Take 10 mg by mouth 3 (three) times daily as needed for itching., Disp: , Rfl:    ibuprofen (ADVIL) 200 MG tablet, Take 400-800 mg by mouth every 8 (eight) hours as needed (PAIN.)., Disp: , Rfl:      levothyroxine (SYNTHROID) 88 MCG tablet, Take 100 mcg by mouth daily before breakfast., Disp: , Rfl:    losartan (COZAAR) 100 MG tablet, Take 100 mg by mouth in the morning., Disp: , Rfl:    meclizine (ANTIVERT) 25 MG tablet, Take 25 mg by mouth 3 (three) times daily as needed for dizziness., Disp: , Rfl:    metFORMIN (GLUCOPHAGE-XR) 500 MG 24 hr tablet, Take 1,000 mg by mouth at bedtime., Disp: , Rfl:    Multiple Vitamin (MULTIVITAMIN WITH MINERALS) TABS tablet, Take 1 tablet by mouth daily., Disp: , Rfl:    ondansetron (ZOFRAN) 4 MG tablet, Take 1 tablet (4 mg total) by mouth every 8 (eight) hours as needed for nausea or vomiting., Disp: 20 tablet, Rfl: 0   oxyCODONE (OXY  IR/ROXICODONE) 5 MG immediate release tablet, Take 1 tablet (5 mg total) by mouth every 6 (six) hours as needed for up to 5 days for severe pain., Disp: 20 tablet, Rfl: 0   potassium chloride SA (KLOR-CON M) 20 MEQ tablet, Take 1 tablet (20 mEq total) by mouth 2 (two) times daily., Disp: 60 tablet, Rfl: 0   tiZANidine (ZANAFLEX) 4 MG tablet, Take 1 tablet (4 mg total) by mouth every 8 (eight) hours as needed for muscle spasms., Disp: 30 tablet, Rfl: 0   vitamin B-12 (CYANOCOBALAMIN) 1000 MCG tablet, Take 1,000 mcg by mouth in the morning., Disp: , Rfl:   Past Medical Problems: Past Medical History:  Diagnosis Date   Breast cancer (HCC)    Family history of breast cancer    Family history of colon cancer    Hepatitis    A-YEARS AGO   Hypercholesteremia    Hypertension    Hypothyroidism    Osteopenia of multiple sites    Pneumonia    Statin-induced myositis    Thyroid cancer (HCC)     Past Surgical History: Past Surgical History:  Procedure Laterality Date   BREAST BIOPSY Right 02/10/2021   us bx, heart marker, path pending   BREAST BIOPSY Right 02/18/2021   stereo bx, distortion "COIL" clip-path pending   BREAST RECONSTRUCTION WITH PLACEMENT OF TISSUE EXPANDER AND FLEX HD (ACELLULAR HYDRATED DERMIS) Right 04/08/2021   Procedure: RIGHT BREAST RECONSTRUCTION WITH PLACEMENT OF TISSUE EXPANDER AND FLEX HD (ACELLULAR HYDRATED DERMIS);  Surgeon: Dillingham, Claire S, DO;  Location: [facility];  Service: Plastics;  Laterality: Right;   BREAST REDUCTION SURGERY Left 06/25/2021   Procedure: MAMMARY REDUCTION  (BREAST) LEFT BREAST;  Surgeon: Dillingham, Claire S, DO;  Location: [facility];  Service: Plastics;  Laterality: Left;   COLONOSCOPY W/ POLYPECTOMY     LUMBAR LAMINECTOMY/ DECOMPRESSION WITH MET-RX Right 09/16/2020   Procedure: Right Lumbar five Sacral one Minimally  invasive Discectomy with Metrex;  Surgeon: Ostergard, Thomas A, MD;  Location: [facility];  Service:  Neurosurgery;  Laterality: Right;   MASTECTOMY Right    MASTECTOMY WITH AXILLARY LYMPH NODE DISSECTION Right 04/08/2021   Procedure: MASTECTOMY WITH AXILLARY LYMPH NODE DISSECTION;  Surgeon: Byrnett, Jeffrey W, MD;  Location: [facility];  Service: General;  Laterality: Right;   MASTOPEXY Left 06/25/2021   Procedure: MASTOPEXY LEFT BREAST;  Surgeon: Dillingham, Claire S, DO;  Location: [facility];  Service: Plastics;  Laterality: Left;   RECONSTRUCTION OF ANGULAR DEFORMITY,TOE Right 10/15/2021   Procedure: 28313 - RECONSTRUCTION hammertoe OVERLAPPING TOE - SECOND;  Surgeon: Baker, Andrew, DPM;  Location: [facility];  Service: Podiatry;  Laterality: Right;   REMOVAL OF BILATERAL TISSUE EXPANDERS WITH   PLACEMENT OF BILATERAL BREAST IMPLANTS Right 06/25/2021   Procedure: REMOVAL OF RIGH TISSUE EXPANDERS WITH PLACEMENT OF RIGHT BREAST IMPLANTS;  Surgeon: Dillingham, Claire S, DO;  Location: [facility];  Service: Plastics;  Laterality: Right;  1.5 hours   THYROIDECTOMY Bilateral 03/26/2015   Procedure: THYROIDECTOMY;  Surgeon: Paul Bennett, MD;  Location: [facility];  Service: ENT;  Laterality: Bilateral;   WEIL OSTEOTOMY Right 10/15/2021   Procedure: 28308 - WEIL OSTEOTOMY - SECOND;  Surgeon: Baker, Andrew, DPM;  Location: [facility];  Service: Podiatry;  Laterality: Right;  Anesthesia: Choice preop pop block   WISDOM TOOTH EXTRACTION      Social History: Social History   Socioeconomic History   Marital status: Married    Spouse name: ,Eddie   Number of children: Not on file   Years of education: Not on file   Highest education level: Not on file  Occupational History   Not on file  Tobacco Use   Smoking status: Former    Packs/day: 1.00    Years: 20.00    Total pack years: 20.00    Types: Cigarettes    Quit date: 03/21/1983    Years since quitting: 38.8   Smokeless tobacco: Never  Vaping Use   Vaping Use: Never used  Substance and Sexual Activity   Alcohol use:  Yes    Comment: social   Drug use: No   Sexual activity: Not Currently    Birth control/protection: Post-menopausal  Other Topics Concern   Not on file  Social History Narrative   Not on file   Social Determinants of Health   Financial Resource Strain: Not on file  Food Insecurity: Not on file  Transportation Needs: Not on file  Physical Activity: Not on file  Stress: Not on file  Social Connections: Not on file  Intimate Partner Violence: Not on file    Family History: Family History  Problem Relation Age of Onset   Alzheimer's disease Mother    Hernia Mother    Heart disease Father    Colon cancer Maternal Aunt        d. under 50   Breast cancer Paternal Aunt 40   Heart attack Maternal Grandfather    Diabetes Paternal Grandmother    Breast cancer Cousin 70    Review of Systems: Review of Systems  Constitutional: Negative.   Respiratory: Negative.    Cardiovascular: Negative.   Gastrointestinal: Negative.   Neurological: Negative.     Physical Exam: Vital Signs BP (!) 144/72 (BP Location: Right Arm, Patient Position: Sitting, Cuff Size: Small)   Pulse 70   Ht 4' 10" (1.473 m)   Wt 142 lb 6.4 oz (64.6 kg)   SpO2 95%   BMI 29.76 kg/m   Physical Exam  Constitutional:      General: Not in acute distress.    Appearance: Normal appearance. Not ill-appearing.  HENT:     Head: Normocephalic and atraumatic.  Eyes:     Pupils: Pupils are equal, round Neck:     Musculoskeletal: Normal range of motion.  Cardiovascular:     Rate and Rhythm: Normal rate    Pulses: Normal pulses.  Pulmonary:     Effort: Pulmonary effort is normal. No respiratory distress.  Musculoskeletal: Normal range of motion.  Skin:    General: Skin is warm and dry.     Findings: No erythema or rash.  Neurological:     General: No focal deficit present.     Mental   Status: Alert and oriented to person, place, and time. Mental status is at baseline.     Motor: No weakness.   Psychiatric:        Mood and Affect: Mood normal.        Behavior: Behavior normal.    Assessment/Plan: The patient is scheduled for liposuction for lipo filling of right breast with Dr. Marla Roe.  Risks, benefits, and alternatives of procedure discussed, questions answered and consent obtained.    Smoking Status: Non-smoker; Counseling Given?  N/A  Caprini Score: 7, high; Risk Factors include: Age, history of cancer, BMI greater than 25, and length of planned surgery. Recommendation for mechanical prophylaxis. Encourage early ambulation.   Pictures obtained: 09/01/2021  Post-op Rx sent to pharmacy:  Oxycodone, Keflex  Patient was provided with the breast reconstruction and General Surgical Risk consent document and Pain Medication Agreement prior to their appointment.  They had adequate time to read through the risk consent documents and Pain Medication Agreement. We also discussed them in person together during this preop appointment. All of their questions were answered to their satisfaction.  Recommended calling if they have any further questions.  Risk consent form and Pain Medication Agreement to be scanned into patient's chart.  The risks that can be encountered with and after liposuction were discussed and include the following but no limited to these:  Asymmetry, fluid accumulation, firmness of the area, fat necrosis with death of fat tissue, bleeding, infection, delayed healing, anesthesia risks, skin sensation changes, injury to structures including nerves, blood vessels, and muscles which may be temporary or permanent, allergies to tape, suture materials and glues, blood products, topical preparations or injected agents, skin and contour irregularities, skin discoloration and swelling, deep vein thrombosis, cardiac and pulmonary complications, pain, which may persist, persistent pain, recurrence of the lesion, poor healing of the incision, possible need for revisional surgery or  staged procedures. Thiere can also be persistent swelling, poor wound healing, rippling or loose skin, worsening of cellulite, swelling, and thermal burn or heat injury from ultrasound with the ultrasound-assisted lipoplasty technique. Any change in weight fluctuations can alter the outcome.    Electronically signed by: Carola Rhine Maddon Horton, PA-C 01/05/2022 10:05 AM

## 2022-01-05 NOTE — Progress Notes (Signed)
Patient ID: Gabrielle Davidson, female    DOB: 25-Aug-1950, 71 y.o.   MRN: [id number]  Chief Complaint  Patient presents with   Pre-op Exam      ICD-10-CM   1. Invasive carcinoma of breast ([place])  C50.919     2. S/P breast reconstruction  Z98.890       History of Present Illness: Gabrielle Davidson is a 71 y.o.  female  with a history of right breast reconstruction.  She presents for preoperative evaluation for upcoming procedure, liposuction for lipo filling of right breast, scheduled for 01/14/2022 with Dr. Marla Roe.  The patient has not had problems with anesthesia. No history of DVT/PE.  No family history of DVT/PE.  No family or personal history of bleeding or clotting disorders.  Patient is not currently taking any blood thinners.  No history of CVA/MI.   Summary of Previous Visit: Patient with right breast silicone implant in place, left breast reduction on 06/25/2021 with Dr. Marla Roe.  She has a 535 cc Mentor smooth round ultrahigh profile gel implant in place on the right.  Job: Pharmacist  PMH Significant for: Right breast cancer, hyperlipidemia, hypertension, on appetite suppressants.  Patient is currently taking Phendimetrazine for weight loss.  She is aware to hold this 1 week prior to surgery.  She is also on ASA 81 mg daily for preventative reasons.  She is going to also stop this 7 days prior to surgery.  She takes ibuprofen occasionally.  She denies any recent changes to her health.  She reports that she read through the consent form and has some questions.  She does report that she has had liposuction in the past, approximately 10 years ago.  She reports that she was told there was difficulty obtaining fat and that her fat is "around her organs".  Past Medical History: Allergies: No Known Allergies  Current Medications:  Current Outpatient Medications:    amitriptyline (ELAVIL) 25 MG tablet, Take 25 mg by mouth at bedtime., Disp: , Rfl:     amoxicillin-clavulanate (AUGMENTIN) 500-125 MG tablet, Take 1 tablet (500 mg total) by mouth 3 (three) times daily., Disp: 21 tablet, Rfl: 0   Ascorbic Acid (VITAMIN C) 1000 MG tablet, Take 1,000 mg by mouth in the morning and at bedtime., Disp: , Rfl:    Biotin 10000 MCG TABS, Take 10,000 mcg by mouth in the morning and at bedtime., Disp: , Rfl:    Calcium Carbonate-Vitamin D3 600-400 MG-UNIT TABS, Take 1-2 tablets by mouth See admin instructions. 1 tablet in the morning and 2 tablets at bedtime, Disp: , Rfl:    cephALEXin (KEFLEX) 500 MG capsule, Take 1 capsule (500 mg total) by mouth 4 (four) times daily for 5 days., Disp: 20 capsule, Rfl: 0   chlorthalidone (HYGROTON) 25 MG tablet, Take 25 mg by mouth in the morning., Disp: , Rfl:    ezetimibe (ZETIA) 10 MG tablet, Take 10 mg by mouth daily., Disp: , Rfl:    FLUoxetine (PROZAC) 40 MG capsule, Take 40 mg by mouth every morning. , Disp: , Rfl:    gabapentin (NEURONTIN) 300 MG capsule, Take 600 mg by mouth 2 (two) times daily., Disp: , Rfl:    hydrOXYzine (ATARAX) 10 MG tablet, Take 10 mg by mouth 3 (three) times daily as needed for itching., Disp: , Rfl:    ibuprofen (ADVIL) 200 MG tablet, Take 400-800 mg by mouth every 8 (eight) hours as needed (PAIN.)., Disp: , Rfl:  levothyroxine (SYNTHROID) 88 MCG tablet, Take 100 mcg by mouth daily before breakfast., Disp: , Rfl:    losartan (COZAAR) 100 MG tablet, Take 100 mg by mouth in the morning., Disp: , Rfl:    meclizine (ANTIVERT) 25 MG tablet, Take 25 mg by mouth 3 (three) times daily as needed for dizziness., Disp: , Rfl:    metFORMIN (GLUCOPHAGE-XR) 500 MG 24 hr tablet, Take 1,000 mg by mouth at bedtime., Disp: , Rfl:    Multiple Vitamin (MULTIVITAMIN WITH MINERALS) TABS tablet, Take 1 tablet by mouth daily., Disp: , Rfl:    ondansetron (ZOFRAN) 4 MG tablet, Take 1 tablet (4 mg total) by mouth every 8 (eight) hours as needed for nausea or vomiting., Disp: 20 tablet, Rfl: 0   oxyCODONE (OXY  IR/ROXICODONE) 5 MG immediate release tablet, Take 1 tablet (5 mg total) by mouth every 6 (six) hours as needed for up to 5 days for severe pain., Disp: 20 tablet, Rfl: 0   potassium chloride SA (KLOR-CON M) 20 MEQ tablet, Take 1 tablet (20 mEq total) by mouth 2 (two) times daily., Disp: 60 tablet, Rfl: 0   tiZANidine (ZANAFLEX) 4 MG tablet, Take 1 tablet (4 mg total) by mouth every 8 (eight) hours as needed for muscle spasms., Disp: 30 tablet, Rfl: 0   vitamin B-12 (CYANOCOBALAMIN) 1000 MCG tablet, Take 1,000 mcg by mouth in the morning., Disp: , Rfl:   Past Medical Problems: Past Medical History:  Diagnosis Date   Breast cancer (HCC)    Family history of breast cancer    Family history of colon cancer    Hepatitis    A-YEARS AGO   Hypercholesteremia    Hypertension    Hypothyroidism    Osteopenia of multiple sites    Pneumonia    Statin-induced myositis    Thyroid cancer (HCC)     Past Surgical History: Past Surgical History:  Procedure Laterality Date   BREAST BIOPSY Right 02/10/2021   us bx, heart marker, path pending   BREAST BIOPSY Right 02/18/2021   stereo bx, distortion "COIL" clip-path pending   BREAST RECONSTRUCTION WITH PLACEMENT OF TISSUE EXPANDER AND FLEX HD (ACELLULAR HYDRATED DERMIS) Right 04/08/2021   Procedure: RIGHT BREAST RECONSTRUCTION WITH PLACEMENT OF TISSUE EXPANDER AND FLEX HD (ACELLULAR HYDRATED DERMIS);  Surgeon: Dillingham, Claire S, DO;  Location: [facility];  Service: Plastics;  Laterality: Right;   BREAST REDUCTION SURGERY Left 06/25/2021   Procedure: MAMMARY REDUCTION  (BREAST) LEFT BREAST;  Surgeon: Dillingham, Claire S, DO;  Location: [facility];  Service: Plastics;  Laterality: Left;   COLONOSCOPY W/ POLYPECTOMY     LUMBAR LAMINECTOMY/ DECOMPRESSION WITH MET-RX Right 09/16/2020   Procedure: Right Lumbar five Sacral one Minimally  invasive Discectomy with Metrex;  Surgeon: Ostergard, Thomas A, MD;  Location: [facility];  Service:  Neurosurgery;  Laterality: Right;   MASTECTOMY Right    MASTECTOMY WITH AXILLARY LYMPH NODE DISSECTION Right 04/08/2021   Procedure: MASTECTOMY WITH AXILLARY LYMPH NODE DISSECTION;  Surgeon: Byrnett, Jeffrey W, MD;  Location: [facility];  Service: General;  Laterality: Right;   MASTOPEXY Left 06/25/2021   Procedure: MASTOPEXY LEFT BREAST;  Surgeon: Dillingham, Claire S, DO;  Location: [facility];  Service: Plastics;  Laterality: Left;   RECONSTRUCTION OF ANGULAR DEFORMITY,TOE Right 10/15/2021   Procedure: 28313 - RECONSTRUCTION hammertoe OVERLAPPING TOE - SECOND;  Surgeon: Baker, Andrew, DPM;  Location: [facility];  Service: Podiatry;  Laterality: Right;   REMOVAL OF BILATERAL TISSUE EXPANDERS WITH   PLACEMENT OF BILATERAL BREAST IMPLANTS Right 06/25/2021   Procedure: REMOVAL OF RIGH TISSUE EXPANDERS WITH PLACEMENT OF RIGHT BREAST IMPLANTS;  Surgeon: Dillingham, Claire S, DO;  Location: [facility];  Service: Plastics;  Laterality: Right;  1.5 hours   THYROIDECTOMY Bilateral 03/26/2015   Procedure: THYROIDECTOMY;  Surgeon: Paul Bennett, MD;  Location: [facility];  Service: ENT;  Laterality: Bilateral;   WEIL OSTEOTOMY Right 10/15/2021   Procedure: 28308 - WEIL OSTEOTOMY - SECOND;  Surgeon: Baker, Andrew, DPM;  Location: [facility];  Service: Podiatry;  Laterality: Right;  Anesthesia: Choice preop pop block   WISDOM TOOTH EXTRACTION      Social History: Social History   Socioeconomic History   Marital status: Married    Spouse name: ,Eddie   Number of children: Not on file   Years of education: Not on file   Highest education level: Not on file  Occupational History   Not on file  Tobacco Use   Smoking status: Former    Packs/day: 1.00    Years: 20.00    Total pack years: 20.00    Types: Cigarettes    Quit date: 03/21/1983    Years since quitting: 38.8   Smokeless tobacco: Never  Vaping Use   Vaping Use: Never used  Substance and Sexual Activity   Alcohol use:  Yes    Comment: social   Drug use: No   Sexual activity: Not Currently    Birth control/protection: Post-menopausal  Other Topics Concern   Not on file  Social History Narrative   Not on file   Social Determinants of Health   Financial Resource Strain: Not on file  Food Insecurity: Not on file  Transportation Needs: Not on file  Physical Activity: Not on file  Stress: Not on file  Social Connections: Not on file  Intimate Partner Violence: Not on file    Family History: Family History  Problem Relation Age of Onset   Alzheimer's disease Mother    Hernia Mother    Heart disease Father    Colon cancer Maternal Aunt        d. under 50   Breast cancer Paternal Aunt 40   Heart attack Maternal Grandfather    Diabetes Paternal Grandmother    Breast cancer Cousin 70    Review of Systems: Review of Systems  Constitutional: Negative.   Respiratory: Negative.    Cardiovascular: Negative.   Gastrointestinal: Negative.   Neurological: Negative.     Physical Exam: Vital Signs BP (!) 144/72 (BP Location: Right Arm, Patient Position: Sitting, Cuff Size: Small)   Pulse 70   Ht 4' 10" (1.473 m)   Wt 142 lb 6.4 oz (64.6 kg)   SpO2 95%   BMI 29.76 kg/m   Physical Exam  Constitutional:      General: Not in acute distress.    Appearance: Normal appearance. Not ill-appearing.  HENT:     Head: Normocephalic and atraumatic.  Eyes:     Pupils: Pupils are equal, round Neck:     Musculoskeletal: Normal range of motion.  Cardiovascular:     Rate and Rhythm: Normal rate    Pulses: Normal pulses.  Pulmonary:     Effort: Pulmonary effort is normal. No respiratory distress.  Musculoskeletal: Normal range of motion.  Skin:    General: Skin is warm and dry.     Findings: No erythema or rash.  Neurological:     General: No focal deficit present.     Mental   Status: Alert and oriented to person, place, and time. Mental status is at baseline.     Motor: No weakness.   Psychiatric:        Mood and Affect: Mood normal.        Behavior: Behavior normal.    Assessment/Plan: The patient is scheduled for liposuction for lipo filling of right breast with Dr. Marla Roe.  Risks, benefits, and alternatives of procedure discussed, questions answered and consent obtained.    Smoking Status: Non-smoker; Counseling Given?  N/A  Caprini Score: 7, high; Risk Factors include: Age, history of cancer, BMI greater than 25, and length of planned surgery. Recommendation for mechanical prophylaxis. Encourage early ambulation.   Pictures obtained: 09/01/2021  Post-op Rx sent to pharmacy:  Oxycodone, Keflex  Patient was provided with the breast reconstruction and General Surgical Risk consent document and Pain Medication Agreement prior to their appointment.  They had adequate time to read through the risk consent documents and Pain Medication Agreement. We also discussed them in person together during this preop appointment. All of their questions were answered to their satisfaction.  Recommended calling if they have any further questions.  Risk consent form and Pain Medication Agreement to be scanned into patient's chart.  The risks that can be encountered with and after liposuction were discussed and include the following but no limited to these:  Asymmetry, fluid accumulation, firmness of the area, fat necrosis with death of fat tissue, bleeding, infection, delayed healing, anesthesia risks, skin sensation changes, injury to structures including nerves, blood vessels, and muscles which may be temporary or permanent, allergies to tape, suture materials and glues, blood products, topical preparations or injected agents, skin and contour irregularities, skin discoloration and swelling, deep vein thrombosis, cardiac and pulmonary complications, pain, which may persist, persistent pain, recurrence of the lesion, poor healing of the incision, possible need for revisional surgery or  staged procedures. Thiere can also be persistent swelling, poor wound healing, rippling or loose skin, worsening of cellulite, swelling, and thermal burn or heat injury from ultrasound with the ultrasound-assisted lipoplasty technique. Any change in weight fluctuations can alter the outcome.    Electronically signed by: Carola Rhine Caroleena Paolini, PA-C 01/05/2022 10:05 AM

## 2022-01-06 ENCOUNTER — Other Ambulatory Visit: Payer: Self-pay | Admitting: General Surgery

## 2022-01-06 ENCOUNTER — Other Ambulatory Visit: Payer: Self-pay

## 2022-01-06 ENCOUNTER — Encounter (HOSPITAL_BASED_OUTPATIENT_CLINIC_OR_DEPARTMENT_OTHER): Payer: Self-pay | Admitting: Plastic Surgery

## 2022-01-06 DIAGNOSIS — N6489 Other specified disorders of breast: Secondary | ICD-10-CM

## 2022-01-06 DIAGNOSIS — R928 Other abnormal and inconclusive findings on diagnostic imaging of breast: Secondary | ICD-10-CM

## 2022-01-08 ENCOUNTER — Encounter (HOSPITAL_BASED_OUTPATIENT_CLINIC_OR_DEPARTMENT_OTHER)
Admission: RE | Admit: 2022-01-08 | Discharge: 2022-01-08 | Disposition: A | Discharge status: Home / Self Care | Payer: Medicare HMO | Source: Ambulatory Visit | Attending: Plastic Surgery | Admitting: Plastic Surgery

## 2022-01-08 DIAGNOSIS — Z01812 Encounter for preprocedural laboratory examination: Secondary | ICD-10-CM | POA: Diagnosis present

## 2022-01-08 LAB — COMPREHENSIVE METABOLIC PANEL
ALT: 35 U/L (ref 0–44)
AST: 24 U/L (ref 15–41)
Albumin: 4 g/dL (ref 3.5–5.0)
Alkaline Phosphatase: 81 U/L (ref 38–126)
Anion gap: 12 (ref 5–15)
BUN: 12 mg/dL (ref 8–23)
CO2: 23 mmol/L (ref 22–32)
Calcium: 9.9 mg/dL (ref 8.9–10.3)
Chloride: 100 mmol/L (ref 98–111)
Creatinine, Ser: 0.78 mg/dL (ref 0.44–1.00)
GFR, Estimated: >60 mL/min (ref >60)
Glucose, Bld: 117 mg/dL — ABNORMAL HIGH (ref 70–99)
Potassium: 3.6 mmol/L (ref 3.5–5.1)
Sodium: 135 mmol/L (ref 135–145)
Total Bilirubin: 0.4 mg/dL (ref 0.3–1.2)
Total Protein: 6.7 g/dL (ref 6.5–8.1)

## 2022-01-12 ENCOUNTER — Ambulatory Visit
Admission: RE | Admit: 2022-01-12 | Discharge: 2022-01-12 | Disposition: A | Discharge status: Home / Self Care | Payer: Medicare HMO | Source: Ambulatory Visit | Attending: General Surgery | Admitting: General Surgery

## 2022-01-12 DIAGNOSIS — N6489 Other specified disorders of breast: Secondary | ICD-10-CM

## 2022-01-12 DIAGNOSIS — R928 Other abnormal and inconclusive findings on diagnostic imaging of breast: Secondary | ICD-10-CM | POA: Diagnosis not present

## 2022-01-13 NOTE — Anesthesia Preprocedure Evaluation (Signed)
Anesthesia Evaluation  Patient identified by MRN, date of birth, ID band Patient awake    Reviewed: Allergy & Precautions, NPO status , Patient's Chart, lab work & pertinent test results  History of Anesthesia Complications Negative for: history of anesthetic complications  Airway Mallampati: II  TM Distance: >3 FB Neck ROM: Full    Dental no notable dental hx. (+) Dental Advisory Given   Pulmonary former smoker   Pulmonary exam normal        Cardiovascular hypertension, Pt. on medications Normal cardiovascular exam     Neuro/Psych negative neurological ROS  negative psych ROS   GI/Hepatic negative GI ROS,,,(+) Hepatitis -, A  Endo/Other  diabetesHypothyroidism    Renal/GU negative Renal ROS  negative genitourinary   Musculoskeletal negative musculoskeletal ROS (+)    Abdominal   Peds  Hematology negative hematology ROS (+)   Anesthesia Other Findings   Reproductive/Obstetrics                             Anesthesia Physical Anesthesia Plan  ASA: 2  Anesthesia Plan: General   Post-op Pain Management: Tylenol PO (pre-op)* and Celebrex PO (pre-op)*   Induction: Intravenous  PONV Risk Score and Plan: 4 or greater and Ondansetron, Dexamethasone, Midazolam and Scopolamine patch - Pre-op  Airway Management Planned: LMA  Additional Equipment:   Intra-op Plan:   Post-operative Plan: Extubation in OR  Informed Consent: I have reviewed the patients History and Physical, chart, labs and discussed the procedure including the risks, benefits and alternatives for the proposed anesthesia with the patient or authorized representative who has indicated his/her understanding and acceptance.     Dental advisory given  Plan Discussed with: Anesthesiologist and CRNA  Anesthesia Plan Comments:         Anesthesia Quick Evaluation

## 2022-01-14 ENCOUNTER — Other Ambulatory Visit: Payer: Self-pay

## 2022-01-14 ENCOUNTER — Ambulatory Visit (HOSPITAL_BASED_OUTPATIENT_CLINIC_OR_DEPARTMENT_OTHER)
Admission: RE | Admit: 2022-01-14 | Discharge: 2022-01-14 | Disposition: A | Discharge status: Home / Self Care | Payer: Medicare HMO | Source: Ambulatory Visit | Attending: Plastic Surgery | Admitting: Plastic Surgery

## 2022-01-14 ENCOUNTER — Ambulatory Visit (HOSPITAL_BASED_OUTPATIENT_CLINIC_OR_DEPARTMENT_OTHER): Payer: Medicare HMO | Admitting: Anesthesiology

## 2022-01-14 ENCOUNTER — Encounter (HOSPITAL_BASED_OUTPATIENT_CLINIC_OR_DEPARTMENT_OTHER): Payer: Self-pay | Admitting: Plastic Surgery

## 2022-01-14 ENCOUNTER — Encounter (HOSPITAL_BASED_OUTPATIENT_CLINIC_OR_DEPARTMENT_OTHER)
Admission: RE | Disposition: A | Discharge status: Home / Self Care | Payer: Self-pay | Source: Ambulatory Visit | Attending: Plastic Surgery

## 2022-01-14 DIAGNOSIS — C50911 Malignant neoplasm of unspecified site of right female breast: Secondary | ICD-10-CM | POA: Insufficient documentation

## 2022-01-14 DIAGNOSIS — Z9889 Other specified postprocedural states: Secondary | ICD-10-CM | POA: Diagnosis not present

## 2022-01-14 DIAGNOSIS — N6489 Other specified disorders of breast: Secondary | ICD-10-CM | POA: Diagnosis not present

## 2022-01-14 DIAGNOSIS — E785 Hyperlipidemia, unspecified: Secondary | ICD-10-CM | POA: Insufficient documentation

## 2022-01-14 DIAGNOSIS — Z7982 Long term (current) use of aspirin: Secondary | ICD-10-CM | POA: Diagnosis not present

## 2022-01-14 DIAGNOSIS — N651 Disproportion of reconstructed breast: Secondary | ICD-10-CM

## 2022-01-14 DIAGNOSIS — Z9011 Acquired absence of right breast and nipple: Secondary | ICD-10-CM

## 2022-01-14 DIAGNOSIS — Z853 Personal history of malignant neoplasm of breast: Secondary | ICD-10-CM

## 2022-01-14 DIAGNOSIS — I10 Essential (primary) hypertension: Secondary | ICD-10-CM | POA: Diagnosis not present

## 2022-01-14 DIAGNOSIS — Z79899 Other long term (current) drug therapy: Secondary | ICD-10-CM | POA: Diagnosis not present

## 2022-01-14 DIAGNOSIS — C50919 Malignant neoplasm of unspecified site of unspecified female breast: Secondary | ICD-10-CM | POA: Diagnosis present

## 2022-01-14 DIAGNOSIS — Z87891 Personal history of nicotine dependence: Secondary | ICD-10-CM | POA: Insufficient documentation

## 2022-01-14 HISTORY — PX: LIPOSUCTION WITH LIPOFILLING: SHX6436

## 2022-01-14 SURGERY — LIPOSUCTION, WITH FAT TRANSFER
Anesthesia: General | Site: Breast | Laterality: Right

## 2022-01-14 MED ORDER — ACETAMINOPHEN 325 MG RE SUPP: 650.0000 mg | RECTAL | Status: DC | PRN

## 2022-01-14 MED ORDER — ACETAMINOPHEN 500 MG PO TABS
1000.0000 mg | ORAL_TABLET | Freq: Once | ORAL | Status: AC
  Administered 2022-01-14: 1000 mg via ORAL

## 2022-01-14 MED ORDER — PROMETHAZINE HCL 25 MG/ML IJ SOLN: 6.2500 mg | INTRAMUSCULAR | Status: DC | PRN

## 2022-01-14 MED ORDER — FENTANYL CITRATE (PF) 100 MCG/2ML IJ SOLN: 25.0000 ug | INTRAMUSCULAR | Status: DC | PRN

## 2022-01-14 MED ORDER — SODIUM CHLORIDE 0.9 % IV SOLN: 250.0000 mL | INTRAVENOUS | Status: DC | PRN

## 2022-01-14 MED ORDER — PROPOFOL 10 MG/ML IV BOLUS
INTRAVENOUS | Status: AC
  Filled 2022-01-14: qty 20

## 2022-01-14 MED ORDER — ROCURONIUM BROMIDE 10 MG/ML (PF) SYRINGE
PREFILLED_SYRINGE | INTRAVENOUS | Status: AC
  Filled 2022-01-14: qty 10

## 2022-01-14 MED ORDER — CEFAZOLIN SODIUM-DEXTROSE 2-4 GM/100ML-% IV SOLN
2.0000 g | INTRAVENOUS | Status: AC
  Administered 2022-01-14: 2 g via INTRAVENOUS

## 2022-01-14 MED ORDER — LIDOCAINE HCL (PF) 1 % IJ SOLN
INTRAMUSCULAR | Status: AC
  Filled 2022-01-14: qty 60

## 2022-01-14 MED ORDER — PHENYLEPHRINE 80 MCG/ML (10ML) SYRINGE FOR IV PUSH (FOR BLOOD PRESSURE SUPPORT)
PREFILLED_SYRINGE | INTRAVENOUS | Status: AC
  Filled 2022-01-14: qty 10

## 2022-01-14 MED ORDER — SCOPOLAMINE 1 MG/3DAYS TD PT72
1.0000 | MEDICATED_PATCH | TRANSDERMAL | Status: DC
  Administered 2022-01-14: 1.5 mg via TRANSDERMAL

## 2022-01-14 MED ORDER — EPINEPHRINE PF 1 MG/ML IJ SOLN
INTRAMUSCULAR | Status: AC
  Filled 2022-01-14: qty 1

## 2022-01-14 MED ORDER — OXYCODONE HCL 5 MG PO TABS
ORAL_TABLET | ORAL | Status: AC
  Filled 2022-01-14: qty 2

## 2022-01-14 MED ORDER — OXYCODONE HCL 5 MG PO TABS
5.0000 mg | ORAL_TABLET | ORAL | Status: DC | PRN
  Administered 2022-01-14: 10 mg via ORAL

## 2022-01-14 MED ORDER — SODIUM CHLORIDE 0.9% FLUSH: 3.0000 mL | Freq: Two times a day (BID) | INTRAVENOUS | Status: DC

## 2022-01-14 MED ORDER — PHENYLEPHRINE HCL (PRESSORS) 10 MG/ML IV SOLN
INTRAVENOUS | Status: DC | PRN
  Administered 2022-01-14 (×2): 80 ug via INTRAVENOUS
  Administered 2022-01-14: 100 ug via INTRAVENOUS

## 2022-01-14 MED ORDER — ONDANSETRON HCL 4 MG/2ML IJ SOLN
INTRAMUSCULAR | Status: DC | PRN
  Administered 2022-01-14: 4 mg via INTRAVENOUS

## 2022-01-14 MED ORDER — LIDOCAINE HCL (CARDIAC) PF 100 MG/5ML IV SOSY
PREFILLED_SYRINGE | INTRAVENOUS | Status: DC | PRN
  Administered 2022-01-14: 100 mg via INTRAVENOUS

## 2022-01-14 MED ORDER — AMISULPRIDE (ANTIEMETIC) 5 MG/2ML IV SOLN: 10.0000 mg | Freq: Once | INTRAVENOUS | Status: DC | PRN

## 2022-01-14 MED ORDER — FENTANYL CITRATE (PF) 100 MCG/2ML IJ SOLN
25.0000 ug | INTRAMUSCULAR | Status: DC | PRN
  Administered 2022-01-14: 50 ug via INTRAVENOUS

## 2022-01-14 MED ORDER — CELECOXIB 200 MG PO CAPS
200.0000 mg | ORAL_CAPSULE | Freq: Once | ORAL | Status: AC
  Administered 2022-01-14: 200 mg via ORAL

## 2022-01-14 MED ORDER — LIDOCAINE-EPINEPHRINE 1 %-1:100000 IJ SOLN
INTRAMUSCULAR | Status: DC | PRN
  Administered 2022-01-14: 8 mL via INTRAMUSCULAR

## 2022-01-14 MED ORDER — EPHEDRINE SULFATE (PRESSORS) 50 MG/ML IJ SOLN
INTRAMUSCULAR | Status: DC | PRN
  Administered 2022-01-14: 10 mg via INTRAVENOUS
  Administered 2022-01-14: 7 mg via INTRAVENOUS

## 2022-01-14 MED ORDER — DEXAMETHASONE SODIUM PHOSPHATE 10 MG/ML IJ SOLN
INTRAMUSCULAR | Status: AC
  Filled 2022-01-14: qty 1

## 2022-01-14 MED ORDER — LACTATED RINGERS IV SOLN: INTRAVENOUS | Status: DC

## 2022-01-14 MED ORDER — GLYCOPYRROLATE PF 0.2 MG/ML IJ SOSY
PREFILLED_SYRINGE | INTRAMUSCULAR | Status: AC
  Filled 2022-01-14: qty 1

## 2022-01-14 MED ORDER — GLYCOPYRROLATE 0.2 MG/ML IJ SOLN
INTRAMUSCULAR | Status: DC | PRN
  Administered 2022-01-14: .2 mg via INTRAVENOUS

## 2022-01-14 MED ORDER — LIDOCAINE HCL (PF) 0.5 % IJ SOLN
INTRAMUSCULAR | Status: AC
  Filled 2022-01-14: qty 50

## 2022-01-14 MED ORDER — LIDOCAINE 2% (20 MG/ML) 5 ML SYRINGE
INTRAMUSCULAR | Status: AC
  Filled 2022-01-14: qty 5

## 2022-01-14 MED ORDER — SCOPOLAMINE 1 MG/3DAYS TD PT72
MEDICATED_PATCH | TRANSDERMAL | Status: AC
  Filled 2022-01-14: qty 1

## 2022-01-14 MED ORDER — DEXAMETHASONE SODIUM PHOSPHATE 10 MG/ML IJ SOLN
INTRAMUSCULAR | Status: DC | PRN
  Administered 2022-01-14: 8 mg via INTRAVENOUS

## 2022-01-14 MED ORDER — CHLORHEXIDINE GLUCONATE CLOTH 2 % EX PADS: 6.0000 | MEDICATED_PAD | Freq: Once | CUTANEOUS | Status: DC

## 2022-01-14 MED ORDER — ACETAMINOPHEN 325 MG PO TABS: 650.0000 mg | ORAL_TABLET | ORAL | Status: DC | PRN

## 2022-01-14 MED ORDER — FENTANYL CITRATE (PF) 100 MCG/2ML IJ SOLN
INTRAMUSCULAR | Status: DC | PRN
  Administered 2022-01-14 (×2): 25 ug via INTRAVENOUS

## 2022-01-14 MED ORDER — PROPOFOL 10 MG/ML IV BOLUS
INTRAVENOUS | Status: DC | PRN
  Administered 2022-01-14: 150 mg via INTRAVENOUS

## 2022-01-14 MED ORDER — CELECOXIB 200 MG PO CAPS
ORAL_CAPSULE | ORAL | Status: AC
  Filled 2022-01-14: qty 1

## 2022-01-14 MED ORDER — MIDAZOLAM HCL 2 MG/2ML IJ SOLN
INTRAMUSCULAR | Status: AC
  Filled 2022-01-14: qty 2

## 2022-01-14 MED ORDER — ACETAMINOPHEN 500 MG PO TABS
ORAL_TABLET | ORAL | Status: AC
  Filled 2022-01-14: qty 2

## 2022-01-14 MED ORDER — FENTANYL CITRATE (PF) 100 MCG/2ML IJ SOLN
INTRAMUSCULAR | Status: AC
  Filled 2022-01-14: qty 2

## 2022-01-14 MED ORDER — LIDOCAINE-EPINEPHRINE 1 %-1:100000 IJ SOLN
INTRAMUSCULAR | Status: AC
  Filled 2022-01-14: qty 1

## 2022-01-14 MED ORDER — CEFAZOLIN SODIUM-DEXTROSE 2-4 GM/100ML-% IV SOLN
INTRAVENOUS | Status: AC
  Filled 2022-01-14: qty 100

## 2022-01-14 MED ORDER — MIDAZOLAM HCL 5 MG/5ML IJ SOLN
INTRAMUSCULAR | Status: DC | PRN
  Administered 2022-01-14 (×2): 1 mg via INTRAVENOUS

## 2022-01-14 MED ORDER — ONDANSETRON HCL 4 MG/2ML IJ SOLN
INTRAMUSCULAR | Status: AC
  Filled 2022-01-14: qty 2

## 2022-01-14 MED ORDER — EPHEDRINE 5 MG/ML INJ
INTRAVENOUS | Status: AC
  Filled 2022-01-14: qty 5

## 2022-01-14 MED ORDER — BUPIVACAINE HCL (PF) 0.25 % IJ SOLN
INTRAMUSCULAR | Status: AC
  Filled 2022-01-14: qty 30

## 2022-01-14 MED ORDER — SODIUM CHLORIDE 0.9% FLUSH: 3.0000 mL | INTRAVENOUS | Status: DC | PRN

## 2022-01-14 SURGICAL SUPPLY — 45 items
ADH SKN CLS APL DERMABOND .7 (GAUZE/BANDAGES/DRESSINGS) ×1
BINDER ABDOMINAL  9 SM 30-45 (SOFTGOODS)
BINDER ABDOMINAL 10 UNV 27-48 (MISCELLANEOUS) IMPLANT
BINDER ABDOMINAL 12 SM 30-45 (SOFTGOODS) IMPLANT
BINDER ABDOMINAL 9 SM 30-45 (SOFTGOODS) IMPLANT
BINDER BREAST LRG (GAUZE/BANDAGES/DRESSINGS) IMPLANT
BINDER BREAST MEDIUM (GAUZE/BANDAGES/DRESSINGS) IMPLANT
BINDER BREAST XLRG (GAUZE/BANDAGES/DRESSINGS) IMPLANT
BINDER BREAST XXLRG (GAUZE/BANDAGES/DRESSINGS) IMPLANT
BLADE HEX COATED 2.75 (ELECTRODE) IMPLANT
BLADE SURG 15 STRL LF DISP TIS (BLADE) ×1 IMPLANT
BLADE SURG 15 STRL SS (BLADE) ×1
COVER BACK TABLE 60X90IN (DRAPES) ×1 IMPLANT
COVER MAYO STAND STRL (DRAPES) ×1 IMPLANT
DERMABOND ADVANCED .7 DNX12 (GAUZE/BANDAGES/DRESSINGS) ×1 IMPLANT
DRAPE LAPAROSCOPIC ABDOMINAL (DRAPES) ×1 IMPLANT
ELECT REM PT RETURN 9FT ADLT (ELECTROSURGICAL) ×1
ELECTRODE REM PT RTRN 9FT ADLT (ELECTROSURGICAL) ×1 IMPLANT
EXTRACTOR CANIST REVOLVE STRL (CANNISTER) ×1 IMPLANT
GAUZE PAD ABD 8X10 STRL (GAUZE/BANDAGES/DRESSINGS) ×2 IMPLANT
GLOVE BIO SURGEON STRL SZ 6.5 (GLOVE) ×2 IMPLANT
GOWN STRL REUS W/ TWL LRG LVL3 (GOWN DISPOSABLE) ×2 IMPLANT
GOWN STRL REUS W/TWL LRG LVL3 (GOWN DISPOSABLE) ×2
IV LACTATED RINGERS 1000ML (IV SOLUTION) ×2 IMPLANT
LINER CANISTER 1000CC FLEX (MISCELLANEOUS) ×1 IMPLANT
NDL HYPO 25X1 1.5 SAFETY (NEEDLE) ×1 IMPLANT
NEEDLE HYPO 25X1 1.5 SAFETY (NEEDLE) ×1 IMPLANT
PACK BASIN DAY SURGERY FS (CUSTOM PROCEDURE TRAY) ×1 IMPLANT
PAD ALCOHOL SWAB (MISCELLANEOUS) ×1 IMPLANT
PENCIL SMOKE EVACUATOR (MISCELLANEOUS) IMPLANT
SLEEVE SCD COMPRESS KNEE MED (STOCKING) ×1 IMPLANT
SPIKE FLUID TRANSFER (MISCELLANEOUS) IMPLANT
SPONGE T-LAP 18X18 ~~LOC~~+RFID (SPONGE) ×1 IMPLANT
SUT MNCRL AB 4-0 PS2 18 (SUTURE) IMPLANT
SUT MON AB 5-0 PS2 18 (SUTURE) ×2 IMPLANT
SYR 10ML LL (SYRINGE) ×5 IMPLANT
SYR 3ML 18GX1 1/2 (SYRINGE) IMPLANT
SYR 50ML LL SCALE MARK (SYRINGE) ×1 IMPLANT
SYR CONTROL 10ML LL (SYRINGE) ×1 IMPLANT
SYR TOOMEY 50ML (SYRINGE) IMPLANT
TOWEL GREEN STERILE FF (TOWEL DISPOSABLE) ×2 IMPLANT
TRAY DSU PREP LF (CUSTOM PROCEDURE TRAY) ×1 IMPLANT
TUBING INFILTRATION IT-10001 (TUBING) ×1 IMPLANT
TUBING SET GRADUATE ASPIR 12FT (MISCELLANEOUS) ×1 IMPLANT
UNDERPAD 30X36 HEAVY ABSORB (UNDERPADS AND DIAPERS) ×2 IMPLANT

## 2022-01-14 NOTE — Discharge Instructions (Addendum)
No tylenol until 12:45 p.m.   INSTRUCTIONS FOR AFTER BREAST SURGERY   You will likely have some questions about what to expect following your operation.  The following information will help you and your family understand what to expect when you are discharged from the hospital.  It is important to follow these guidelines to help ensure a smooth recovery and reduce complication.  Postoperative instructions include information on: diet, wound care, medications and physical activity.  AFTER SURGERY Expect to go home after the procedure.  In some cases, you may need to spend one night in the hospital for observation.  DIET Breast surgery does not require a specific diet.  However, the healthier you eat the better your body will heal. It is important to increasing your protein intake.  This means limiting the foods with sugar and carbohydrates.  Focus on vegetables and some meat.  If you have liposuction during your procedure be sure to drink water.  If your urine is bright yellow, then it is concentrated, and you need to drink more water.  As a general rule after surgery, you should have 8 ounces of water every hour while awake.  If you find you are persistently nauseated or unable to take in liquids let us know.  NO TOBACCO USE or EXPOSURE.  This will slow your healing process and lead to a wound.  WOUND CARE Leave the binder on for 3 days . Use fragrance free soap like Dial, Dove or [place].   After 3 days you can remove the binder to shower. Once dry apply binder or sports bra. If you have liposuction you will have a soft and spongy dressing (Lipofoam) that helps prevent creases in your skin.  Remove before you shower and then replace it.  It is also available on [employer]. If you have steri-strips / tape directly attached to your skin leave them in place. It is OK to get these wet.   No baths, pools or hot tubs for four weeks. We close your incision to leave the smallest and best-looking scar. No  ointment or creams on your incisions for four weeks.  No Neosporin (Too many skin reactions).  A few weeks after surgery you can use Mederma and start massaging the scar. We ask you to wear your binder or sports bra for the first 6 weeks around the clock, including while sleeping. This provides added comfort and helps reduce the fluid accumulation at the surgery site. No Ice or heating pads to the operative site.  You have a very high risk of a burn before you feel the temperature change.  ACTIVITY No heavy lifting until cleared by the doctor.  This usually means no more than a half-gallon of milk.  It is OK to walk and climb stairs. Moving your legs is very important to decrease your risk of a blood clot.  It will also help keep you from getting deconditioned.  Every 1 to 2 hours get up and walk for 5 minutes. This will help with a quicker recovery back to normal.  Let pain be your guide so you don't do too much.  This time is for you to recover.  You will be more comfortable if you sleep and rest with your head elevated either with a few pillows under you or in a recliner.  No stomach sleeping for a three months.  WORK Everyone returns to work at different times. As a rough guide, most people take at least 1 - 2 weeks  off prior to returning to work. If you need documentation for your job, give the forms to the front staff at the clinic.  DRIVING Arrange for someone to bring you home from the hospital after your surgery.  You may be able to drive a few days after surgery but not while taking any narcotics or valium.  BOWEL MOVEMENTS Constipation can occur after anesthesia and while taking pain medication.  It is important to stay ahead for your comfort.  We recommend taking Milk of Magnesia (2 tablespoons; twice a day) while taking the pain pills.  MEDICATIONS You may be prescribed should start after surgery At your preoperative visit for you history and physical you may have been given the  following medications: An antibiotic: Start this medication when you get home and take according to the instructions on the bottle. Zofran 4 mg:  This is to treat nausea and vomiting.  You can take this every 6 hours as needed and only if needed. Valium 2 mg for breast cancer patients: This is for muscle tightness if you have an implant or expander. This will help relax your muscle which also helps with pain control.  This can be taken every 12 hours as needed. Don't drive after taking this medication. Norco (hydrocodone/acetaminophen) 5/325 mg:  This is only to be used after you have taken the Motrin or the Tylenol. Every 8 hours as needed.   Over the counter Medication to take: Ibuprofen (Motrin) 600 mg:  Take this every 6 hours.  If you have additional pain then take 500 mg of the Tylenol every 8 hours.  Only take the Norco after you have tried these two. MiraLAX or Milk of Magnesia: Take this according to the bottle if you take the [place] Call your surgeon's office if any of the following occur: Fever 101 degrees F or greater Excessive bleeding or fluid from the incision site. Pain that increases over time without aid from the medications Redness, warmth, or pus draining from incision sites Persistent nausea or inability to take in liquids Severe misshapen area that underwent the operation. Post Anesthesia Home Care Instructions  Activity: Get plenty of rest for the remainder of the day. A responsible individual must stay with you for 24 hours following the procedure.  For the next 24 hours, DO NOT: -Drive a car -Paediatric nurse -Drink alcoholic beverages -Take any medication unless instructed by your physician -Make any legal decisions or sign important papers.  Meals: Start with liquid foods such as gelatin or soup. Progress to regular foods as tolerated. Avoid greasy, spicy, heavy foods. If nausea and/or vomiting occur, drink only clear liquids until the nausea  and/or vomiting subsides. Call your physician if vomiting continues.  Special Instructions/Symptoms: Your throat may feel dry or sore from the anesthesia or the breathing tube placed in your throat during surgery. If this causes discomfort, gargle with warm salt water. The discomfort should disappear within 24 hours.  If you had a scopolamine patch placed behind your ear for the management of post- operative nausea and/or vomiting:  1. The medication in the patch is effective for 72 hours, after which it should be removed.  Wrap patch in a tissue and discard in the trash. Wash hands thoroughly with soap and water. 2. You may remove the patch earlier than 72 hours if you experience unpleasant side effects which may include dry mouth, dizziness or visual disturbances. 3. Avoid touching the patch. Wash your hands with soap and  water after contact with the patch.

## 2022-01-14 NOTE — Interval H&P Note (Signed)
History and Physical Interval Note:  01/14/2022 7:00 AM  Gabrielle Davidson  has presented today for surgery, with the diagnosis of Invasive carcinoma of breast.  The various methods of treatment have been discussed with the patient and family. After consideration of risks, benefits and other options for treatment, the patient has consented to  Procedure(s): LIPOSUCTION WITH LIPOFILLING (Right) as a surgical intervention.  The patient's history has been reviewed, patient examined, no change in status, stable for surgery.  I have reviewed the patient's chart and labs.  Questions were answered to the patient's satisfaction.     Loel Lofty Lochlan Grygiel

## 2022-01-14 NOTE — Anesthesia Procedure Notes (Signed)
Procedure Name: LMA Insertion Date/Time: 01/14/2022 7:48 AM  Performed by: Garrel Ridgel, CRNAPre-anesthesia Checklist: Patient identified, Emergency Drugs available, Suction available and Patient being monitored Patient Re-evaluated:Patient Re-evaluated prior to induction Oxygen Delivery Method: Circle system utilized Preoxygenation: Pre-oxygenation with 100% oxygen Induction Type: IV induction Ventilation: Mask ventilation without difficulty LMA: LMA inserted LMA Size: 3.0 Tube type: Oral Number of attempts: 1 Placement Confirmation: positive ETCO2 and breath sounds checked- equal and bilateral Tube secured with: Tape Dental Injury: Teeth and Oropharynx as per pre-operative assessment

## 2022-01-14 NOTE — Transfer of Care (Signed)
Immediate Anesthesia Transfer of Care Note  Patient: Gabrielle Davidson  Procedure(s) Performed: LIPOSUCTION WITH LIPOFILLING (Right: Breast)  Patient Location: PACU  Anesthesia Type:General  Level of Consciousness: oriented, drowsy, and patient cooperative  Airway & Oxygen Therapy: Patient Spontanous Breathing and Patient connected to face mask oxygen  Post-op Assessment: Report given to RN and Post -op Vital signs reviewed and stable  Post vital signs: Reviewed and stable  Last Vitals:  Vitals Value Taken Time  BP 106/59 01/14/22 0918  Temp    Pulse 75 01/14/22 0920  Resp 19 01/14/22 0920  SpO2 100 % 01/14/22 0920  Vitals shown include unvalidated device data.  Last Pain:  Vitals:   01/14/22 0639  TempSrc: Oral  PainSc: 0-No pain      Patients Stated Pain Goal: 3 (00/34/96 1164)  Complications: No notable events documented.

## 2022-01-14 NOTE — Anesthesia Postprocedure Evaluation (Signed)
Anesthesia Post Note  Patient: Gabrielle Davidson  Procedure(s) Performed: LIPOSUCTION WITH LIPOFILLING (Right: Breast)     Patient location during evaluation: PACU Anesthesia Type: General Level of consciousness: sedated Pain management: pain level controlled Vital Signs Assessment: post-procedure vital signs reviewed and stable Respiratory status: spontaneous breathing and respiratory function stable Cardiovascular status: stable Postop Assessment: no apparent nausea or vomiting Anesthetic complications: no   No notable events documented.  Last Vitals:  Vitals:   01/14/22 0930 01/14/22 0959  BP: 100/69 129/82  Pulse: 74 76  Resp: 19 14  Temp:  (!) 36.3 C  SpO2: 98% 96%    Last Pain:  Vitals:   01/14/22 0959  TempSrc: Temporal  PainSc: 0-No pain                 Toivo Bordon DANIEL

## 2022-01-14 NOTE — Op Note (Signed)
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 female born on December 08, 1950, is here for treatment of breast cancer.  The patient underwent a right mastectomy with reconstruction.  She has an implant in place but has some residual asymmetry. She desired better symmetry.   PROCEDURE DETAILS:  The patient was seen prior to surgery and marked.  The IV antibiotics were given. The patient was taken to the operating room and given a general anesthetic. A standard time out was performed and all information was confirmed by those in the room. SCDs were placed.   The breast and abdomen were prepped and draped.  Local with epinephrine was injected into the small intended incision sites of the lower lateral abdomen.  Tumescent was infused.  The liposuction was then done and collected in the revolve.  The adipose was prepared according to the manufacture guidelines.  A small incision was made in the previous incision site of the right breast.  Liposuction was done of the medial and lateral breast for better contour.  The fat was then injected for a total of 300 cc for improved contour and symmetry.  All incisions were closed with the 5-0 Monocryl.  Derma bond was applied with abdominal and breast binder placed. The patient was allowed to wake up and taken to recovery room in stable condition at the end of the case. The family was notified at the end of the case.   The advanced practice practitioner (APP) assisted throughout the case.  The APP was essential in retraction and counter traction when needed to make the case progress  smoothly.  This retraction and assistance made it possible to see the tissue plans for the procedure.  The assistance was needed for blood control, tissue re-approximation and assisted with closure of the incision site.

## 2022-01-15 ENCOUNTER — Encounter (HOSPITAL_BASED_OUTPATIENT_CLINIC_OR_DEPARTMENT_OTHER): Payer: Self-pay | Admitting: Plastic Surgery

## 2022-01-19 ENCOUNTER — Encounter: Payer: Self-pay | Admitting: Surgical

## 2022-01-19 ENCOUNTER — Ambulatory Visit (INDEPENDENT_AMBULATORY_CARE_PROVIDER_SITE_OTHER): Payer: Medicare HMO | Admitting: Surgical

## 2022-01-19 DIAGNOSIS — C50919 Malignant neoplasm of unspecified site of unspecified female breast: Secondary | ICD-10-CM

## 2022-01-19 DIAGNOSIS — Z9889 Other specified postprocedural states: Secondary | ICD-10-CM

## 2022-01-19 NOTE — Progress Notes (Signed)
Patient is a 71 year old female here for follow-up after liposuction of abdomen for lipo filling of right breast with Dr. Marla Roe on 01/14/2022.  She is 5 days postop.  She reports overall she is doing well.  She has been wearing compression garments as recommended.  She is not having any particular issues at this time.  She has some questions about symmetry and nipple areola tattooing.  She has some questions about nonsurgical options to improve the symmetry of her bilateral breasts.  Chaperone present on exam On exam abdominal incisions are intact, healing well.  Mild ecchymosis noted, as expected.  Ecchymosis of right breast is noted, breast is soft, no erythema or cellulitic changes noted.  Incisions intact, Steri-Strips in place.  No subcutaneous fluid collection noted of right breast.  Left breast incisions intact, well-healed.  A/P:  Recommend continue with compressive garments of bilateral breast and abdomen.  Discussed with patient she can switch to spanks for her abdomen compression if she would like. We discussed that over the next few weeks she will notice some changes to the right breast as the swelling improves and the fat that was injected begins to develop a blood supply.  We discussed that the fullness in the upper pole may decrease as this occurs.  We discussed nonsurgical options for improved symmetry would be a surgical bra or prosthesis to assist with increased volume on the right side, patient was not interested in this.  She is also not interested in further surgery at this time.    She is interested in nipple areola tattooing, we discussed that we could do this around 2 months postoperatively.  We will plan to see her back in 2 to 3 weeks to reevaluate the right breast after fat grafting and further discuss nipple areola tattooing.  We will plan for nipple areola tattooing in approximately 2 months.

## 2022-01-22 ENCOUNTER — Encounter: Payer: Medicare HMO | Admitting: Physician Assistant

## 2022-02-02 ENCOUNTER — Ambulatory Visit (INDEPENDENT_AMBULATORY_CARE_PROVIDER_SITE_OTHER): Payer: Medicare HMO | Admitting: Surgical

## 2022-02-02 DIAGNOSIS — Z9889 Other specified postprocedural states: Secondary | ICD-10-CM

## 2022-02-02 DIAGNOSIS — C50919 Malignant neoplasm of unspecified site of unspecified female breast: Secondary | ICD-10-CM

## 2022-02-02 NOTE — Progress Notes (Signed)
Patient is a 71 year old female here for follow-up after lipo filling of right breast with Dr. Marla Roe on 01/14/2022.  She is 3 weeks postop.  Reports she is overall doing well.  She is interested in pursuing nipple areola tattoo on the right.  She is not having any infectious symptoms.  She still feels asymmetric, but is hopeful that the right implant will continue to settle with time.  Chaperone present on exam On exam right breast incision is intact, healing well.  She does have some irritation surrounding to Monocryl sutures that are present, has a mild maculopapular rash as well as some scabbing.  There is no erythema or cellulitic changes.  No active drainage noted.  Right breast implant is soft.  Left breast NAC is viable, no erythema or cellulitic changes.  No subcutaneous fluid collection noted.  A/P:  Sutures were removed, patient tolerated this well.  Recommend following up in 8 weeks for nipple areola tattoo on the right.  Call with questions or concerns.

## 2022-03-18 ENCOUNTER — Encounter: Payer: Medicare HMO | Admitting: Surgical

## 2022-03-30 ENCOUNTER — Ambulatory Visit: Payer: Medicare HMO | Admitting: Surgical

## 2022-03-30 ENCOUNTER — Encounter: Payer: Self-pay | Admitting: Surgical

## 2022-03-30 VITALS — BP 119/69 | HR 81

## 2022-03-30 DIAGNOSIS — Z9011 Acquired absence of right breast and nipple: Secondary | ICD-10-CM

## 2022-03-30 DIAGNOSIS — Z853 Personal history of malignant neoplasm of breast: Secondary | ICD-10-CM | POA: Diagnosis not present

## 2022-03-30 DIAGNOSIS — C50919 Malignant neoplasm of unspecified site of unspecified female breast: Secondary | ICD-10-CM

## 2022-03-30 DIAGNOSIS — Z9889 Other specified postprocedural states: Secondary | ICD-10-CM

## 2022-03-30 NOTE — Progress Notes (Signed)
NIPPLE AREOLAR TATTOO PROCEDURE  PREOPERATIVE DIAGNOSIS:  Acquired absence of right nipple areolar   POSTOPERATIVE DIAGNOSIS: Acquired absence of right nipple areolar    PROCEDURES: right nipple areolar tattoo   ANESTHESIA:  4% lidocaine ointment  COMPLICATIONS: None.  JUSTIFICATION FOR PROCEDURE:  Gabrielle Davidson is a 72 y.o. female with a history of breast cancer status post breast reconstruction. The patient presents for nipple areolar complex tattoo. Risks, benefits, indications, and alternatives of the above described procedures were discussed with the patient and all the patient's questions were answered. Consent was signed.  DESCRIPTION OF PROCEDURE: After informed consent was obtained and proper identification of patient and surgical site was made, the patient was taken to the procedure room and resting in procedure chair. The patient was prepped and draped in the usual sterile fashion. Attention was turned to the selection of flesh colored permanent tattoo ink to produce the appropriate color for nipple areolar complex tattooing. Color, size and location was confirmed with the patient. Pre-op photos were obtained and placed in patient chart with patient consent. Using a Digital Revo 7R pronged tattoo head, pigment was instilled to the designed nipple areolar complex, which was confirmed preoperatively with the patient. Once adequate pigment had been applied to the nipple areolar complex, vaseline followed by an occlusive dressing was applied. The patient tolerated the procedure well. There were no complications  Pigmentation for the areola consisted of a ratio of fair honey, fair peach, warm honey.  The majority of the mixture was fair honey and warm honey, fair peach was used to create a slightly pink tone.  Expiration date for the colors used 03/2024.  The ratio for the nipple center was fair peach to bright peach one-to-one ratio.  The color selection for the shading under the  nipple was warm honey and fair honey.  The total area tattooed was 4.1 x 4.1 cm for a total of 16.81 cm  All of the expiration dates for the pigments instilled in the skin or January 2026.  The patient was very pleased with the outcome, we will plan to see her back in 4 to 6 weeks for a touchup and possible final tattoo at that point.  Pictures were taken and placed in her chart with her permission.

## 2022-05-04 ENCOUNTER — Ambulatory Visit (INDEPENDENT_AMBULATORY_CARE_PROVIDER_SITE_OTHER): Payer: Medicare HMO | Admitting: Surgical

## 2022-05-04 DIAGNOSIS — Z853 Personal history of malignant neoplasm of breast: Secondary | ICD-10-CM

## 2022-05-04 DIAGNOSIS — Z9889 Other specified postprocedural states: Secondary | ICD-10-CM

## 2022-05-04 DIAGNOSIS — Z719 Counseling, unspecified: Secondary | ICD-10-CM

## 2022-05-04 DIAGNOSIS — Z9011 Acquired absence of right breast and nipple: Secondary | ICD-10-CM | POA: Diagnosis not present

## 2022-05-04 DIAGNOSIS — Z411 Encounter for cosmetic surgery: Secondary | ICD-10-CM

## 2022-05-04 NOTE — Progress Notes (Addendum)
NIPPLE AREOLAR TATTOO PROCEDURE  PREOPERATIVE DIAGNOSIS:  Acquired absence of right nipple areolar   POSTOPERATIVE DIAGNOSIS: Acquired absence of right nipple areolar    PROCEDURES: right nipple areolar tattoo   ANESTHESIA:  4% lidocaine ointment  COMPLICATIONS: None.  JUSTIFICATION FOR PROCEDURE:  Gabrielle TINDEL is a 72 y.o. female with a history of breast cancer status post breast reconstruction. The patient presents for nipple areolar complex tattoo. Risks, benefits, indications, and alternatives of the above described procedures were discussed with the patient and all the patient's questions were answered.  Patient reports that she did very well after the last tattoo session and is overall very happy, we discussed options for improving the color and adding some texture.  DESCRIPTION OF PROCEDURE: After informed consent was obtained and proper identification of patient and surgical site was made, the patient was taken to the procedure room and resting in procedure chair. The patient was prepped and draped in the usual sterile fashion. Attention was turned to the selection of flesh colored permanent tattoo ink to produce the appropriate color for nipple areolar complex tattooing. Color, size and location was confirmed with the patient. Pre-op photos were obtained and placed in patient chart with patient consent. Using a Digital Revo 7R pronged tattoo head and a 1P pronged tattoo head, pigment was instilled to the designed nipple areolar complex, which was confirmed preoperatively with the patient. Once adequate pigment had been applied to the nipple areolar complex, vaseline followed by an occlusive dressing was applied. The patient tolerated the procedure well. There were no complications  We opted to add a slightly darker color to the areola, add some pigment around the nipple and create some texturing around the nipple.  A  ratio of 1:4 cool honey to fair honey was used to create the  desired color for the areola 1 part cool honey-  Exp 03/2024  2 part fair honey -  Exp 03/2024   A ratio of cool honey, dark honey and fair honey was used to create the desired color for the nipple texturing as well as Montgomery tubercles -expiration 04/03/2024  Portrait white was used to create additional texturing -expiration 04/03/2024  The total area tattooed was 4.1 x 4.1 cm for a total of 16.81 cm  Pictures were obtained of the patient and placed in the chart with the patient's or guardian's permission.  Botulinum Toxin Procedure Note  Procedure: Cosmetic botulinum toxin  Pre-operative Diagnosis: Dynamic rhytides  Post-operative Diagnosis: Same  Complications:  None  Brief history: The patient desires botulinum toxin injection.  She is aware of the risks including bleeding, damage to deeper structures, asymmetry, brow ptosis, eyelid ptosis, bruising. The patient understands and wishes to proceed.  Procedure: The area was prepped with alcohol and dried with a clean gauze.  Using a clean technique the botulinum toxin was diluted with 2.5 mL of bacteriostatic saline per 100 unit vial which resulted in 4 units per 0.1 mL.  Subsequently the mixture was injected in the glabellar, forehead area with preservation of the temporal branch to the lateral eyebrow. A total of 60 Units of botulinum toxin was used. The forehead and glabellar area was injected with care to inject intramuscular only while holding pressure on the supratrochlear vessels in each area during each injection on either side of the medial corrugators. The injection proceeded vertically superiorly to the medial 2/3 of the frontalis muscle and superior 2/3 of the lateral frontalis, again with preservation of the frontal branch.  No  complications were noted. Light pressure was held for 5 minutes. She was instructed explicitly in post-operative care.  Dysport LOT:  [id number] EXP:  08/13/2023    Laser  procedure:  Preoperative Dx: cherry angioma  Postoperative Dx:  same  Procedure: laser to breast and arm   Anesthesia: none  Description of Procedure:  Risks and complications were explained to the patient. Consent was confirmed and signed. Time out was called and all information was confirmed to be correct. The area  area was prepped with alcohol and wiped dry. The 560 nm laser was set at 20 J/cm2.  The left breast and arm was lasered. The patient tolerated the procedure well and there were no complications. The patient is to follow up in 4 weeks.

## 2022-05-14 ENCOUNTER — Ambulatory Visit (INDEPENDENT_AMBULATORY_CARE_PROVIDER_SITE_OTHER): Payer: Medicare HMO | Admitting: Plastic Surgery

## 2022-05-14 DIAGNOSIS — N651 Disproportion of reconstructed breast: Secondary | ICD-10-CM

## 2022-05-14 DIAGNOSIS — C50919 Malignant neoplasm of unspecified site of unspecified female breast: Secondary | ICD-10-CM

## 2022-05-14 DIAGNOSIS — Z853 Personal history of malignant neoplasm of breast: Secondary | ICD-10-CM

## 2022-05-14 DIAGNOSIS — F4322 Adjustment disorder with anxiety: Secondary | ICD-10-CM

## 2022-05-14 NOTE — Progress Notes (Signed)
   Subjective:    Patient ID: Gabrielle Davidson, female    DOB: 1951/02/20, 72 y.o.   MRN: [id number]  The patient is a 72 year old female joining me by phone for further discussion about her reconstruction.  She was first seen in December 2022 with right breast cancer in the upper inner quadrant.  She is 4 feet 11 inches tall and weighed 144 pounds.  Her preoperative bra size was a DDD.  She underwent a right mastectomy with reconstruction on January 2023 with expander and Flex HD placement.  She then had the expander removed and the implant placed with a left breast reduction June 25, 2021.  She has a Product manager smooth round ultrahigh profile 535 cc in place.  We then had lipo filling in November 2023 for improved symmetry.  She still has significant asymmetry with the left breast being larger than the right.  She feels like it is twice the size.  It is becoming difficult to find clothes that fit properly.  I think it is perfectly reasonable to try and improve the symmetry.    Review of Systems  Constitutional: Negative.   HENT: Negative.    Eyes: Negative.   Respiratory: Negative.  Negative for chest tightness and shortness of breath.   Cardiovascular: Negative.   Gastrointestinal: Negative.   Endocrine: Negative.   Genitourinary: Negative.   Musculoskeletal: Negative.        Objective:   Physical Exam      Assessment & Plan:     ICD-10-CM   1. Invasive carcinoma of breast ([place])  C50.919     2. Adjustment disorder with anxious mood  F43.22     3. Breast asymmetry following reconstructive surgery  N65.1       Plan for left breast reduction mastopexy for improved symmetry.  I connected with  Shaianne R Blumenstein on 05/14/22 by telephone and verified that I am speaking with the correct person using two identifiers.  The patient was at home and I was at the office.  We spent 5 minutes in discussion.   I discussed the limitations of evaluation and management by telemedicine. The  patient expressed understanding and agreed to proceed.

## 2022-06-01 ENCOUNTER — Encounter: Payer: Medicare HMO | Admitting: Surgical

## 2022-06-04 ENCOUNTER — Ambulatory Visit (INDEPENDENT_AMBULATORY_CARE_PROVIDER_SITE_OTHER): Payer: Medicare HMO | Admitting: Surgical

## 2022-06-04 DIAGNOSIS — C50919 Malignant neoplasm of unspecified site of unspecified female breast: Secondary | ICD-10-CM

## 2022-06-04 DIAGNOSIS — Z9889 Other specified postprocedural states: Secondary | ICD-10-CM

## 2022-06-04 DIAGNOSIS — C50411 Malignant neoplasm of upper-outer quadrant of right female breast: Secondary | ICD-10-CM | POA: Diagnosis not present

## 2022-06-04 DIAGNOSIS — N651 Disproportion of reconstructed breast: Secondary | ICD-10-CM

## 2022-06-04 NOTE — Progress Notes (Signed)
   Referring Provider Gabrielle Crouch, MD Gabrielle Davidson [place],  Gabrielle Davidson [id number]   CC:  Chief Complaint  Patient presents with   Procedure      Gabrielle Davidson is an 72 y.o. female.  HPI: Patient is a very pleasant 72 year old female here for follow-up in regards to her bilateral breast reconstruction.  She most recently underwent tattoo to the right reconstructed breast to create a 3D tattoo.  She reports that she is very happy with the outcome, she does not have any concerns at this time.  She does report that she has had some peeling of the right tattoo skin/pigment but has not noticed any pigmentation changes.  She reports she discussed undergoing an additional left breast lift with Dr. Marla Davidson and a discussed planning this for October.   Review of Systems General: No fevers or chills  Physical Exam    03/30/2022    8:38 AM 01/14/2022    9:59 AM 01/14/2022    9:30 AM  Vitals with BMI  Systolic [id number]  Diastolic 69 82 69  Pulse 81 76 74    General:  No acute distress,  Alert and oriented, Non-Toxic, Normal speech and affect Breast: Left breast NAC is viable, incisions intact and well-healed.  She does have asymmetry with the left breast being approximately 1 cup size larger. Right reconstructed breast incision is well-healed.  Right nipple areola tattoo is well-healed.  Pigmentation of the right tattoo matches the native left NAC.  Assessment/Plan Patient is very happy with her right nipple areola tattoo.  After performing examination today, I do not feel as if any additional tattooing is necessary.  Discussed with patient if it fades or she has any concerns we can certainly touch it up but I do not feel as if any touchups are necessary at this time.  She is agreeable to this.  We will plan to see her later in the year for preop appointment related to left breast lift for symmetry.  Pictures were obtained of the patient and placed in  the chart with the patient's or guardian's permission.   Gabrielle Davidson Gabrielle Davidson 06/04/2022, 9:48 AM

## 2022-06-07 ENCOUNTER — Telehealth: Payer: Self-pay | Admitting: Plastic Surgery

## 2022-06-07 NOTE — Telephone Encounter (Signed)
Insurance certification number [id number], LVM for pt that surgery was approved and scheduler will be reaching out to schedule her surgery.

## 2022-06-09 ENCOUNTER — Telehealth: Payer: Self-pay | Admitting: *Deleted

## 2022-06-09 NOTE — Telephone Encounter (Signed)
LVM to schedule sx 

## 2022-06-11 ENCOUNTER — Telehealth: Payer: Self-pay | Admitting: *Deleted

## 2022-06-11 NOTE — Telephone Encounter (Signed)
Spoke with patient to schedule surgery and preop. No PA schedules for Oct at this time so preop may need to change

## 2022-06-15 ENCOUNTER — Telehealth: Payer: Self-pay | Admitting: Plastic Surgery

## 2022-06-15 NOTE — Telephone Encounter (Signed)
Pt wanted surgery schedule on October/23/24 and authorization is good at this time from 3/25 to 9/25.  Called [employer] to see if they would push dates back but they stated we will have to resubmit auth because it is past the validation dates.  Will resubmit closer to surgery time.

## 2022-08-06 ENCOUNTER — Telehealth: Payer: Self-pay | Admitting: Plastic Surgery

## 2022-08-06 ENCOUNTER — Telehealth: Payer: Self-pay | Admitting: *Deleted

## 2022-08-06 NOTE — Telephone Encounter (Signed)
Returned patients call and lvm.

## 2022-08-06 NOTE — Telephone Encounter (Signed)
Pt called and wanted to move up her sx, she stated she wanted it done in October and she now wants it sooner. I did let her know that the sx was out of office today and could call her back next week.

## 2022-08-11 NOTE — Telephone Encounter (Signed)
Patient returned call.  Please call her back at your earliest convenience at [phone].

## 2022-08-26 ENCOUNTER — Other Ambulatory Visit: Payer: Self-pay | Admitting: Internal Medicine

## 2022-08-26 DIAGNOSIS — Z1231 Encounter for screening mammogram for malignant neoplasm of breast: Secondary | ICD-10-CM

## 2022-09-10 ENCOUNTER — Ambulatory Visit (INDEPENDENT_AMBULATORY_CARE_PROVIDER_SITE_OTHER): Payer: Medicare HMO | Admitting: Surgical

## 2022-09-10 ENCOUNTER — Encounter: Payer: Self-pay | Admitting: Surgical

## 2022-09-10 VITALS — BP 115/74 | HR 85 | Temp 98.0°F | Resp 16 | Ht <= 58 in

## 2022-09-10 DIAGNOSIS — C50411 Malignant neoplasm of upper-outer quadrant of right female breast: Secondary | ICD-10-CM

## 2022-09-10 DIAGNOSIS — C50919 Malignant neoplasm of unspecified site of unspecified female breast: Secondary | ICD-10-CM

## 2022-09-10 DIAGNOSIS — N651 Disproportion of reconstructed breast: Secondary | ICD-10-CM

## 2022-09-10 DIAGNOSIS — Z9889 Other specified postprocedural states: Secondary | ICD-10-CM

## 2022-09-10 MED ORDER — CEPHALEXIN 500 MG PO CAPS: 500.0000 mg | ORAL_CAPSULE | Freq: Four times a day (QID) | ORAL | 0 refills | Status: AC

## 2022-09-10 MED ORDER — ONDANSETRON HCL 4 MG PO TABS: 4.0000 mg | ORAL_TABLET | Freq: Three times a day (TID) | ORAL | 0 refills | Status: DC | PRN

## 2022-09-10 MED ORDER — OXYCODONE HCL 5 MG PO TABS: 5.0000 mg | ORAL_TABLET | Freq: Four times a day (QID) | ORAL | 0 refills | Status: AC | PRN

## 2022-09-10 NOTE — H&P (View-Only) (Signed)
Patient ID: Gabrielle Davidson, female    DOB: 1950/04/06, 71 y.o.   MRN: [id number]  Chief Complaint  Patient presents with   Pre-op Exam      ICD-10-CM   1. Invasive carcinoma of breast (HCC)  C50.919     2. S/P breast reconstruction  Z98.890     3. Malignant neoplasm of upper-outer quadrant of right female breast, unspecified estrogen receptor status (HCC)  C50.411     4. Breast asymmetry following reconstructive surgery  N65.1        History of Present Illness: Gabrielle Davidson is a 72 y.o.  female  with a history of right breast cancer with breast reconstruction.  She presents for preoperative evaluation for upcoming procedure, left breast Reduction for symmetry, possible excision of right lateral breast skin and soft tissue with liposuction and direct excision scheduled for 10/07/2022 with Dr.  Ulice Bold  The patient has not had problems with anesthesia. No history of DVT/PE.  No family history of DVT/PE.  No family or personal history of bleeding or clotting disorders.  Patient is not currently taking any blood thinners.  No history of CVA/MI.   Reports today that she has some fullness in the right reconstructed breast that she would like addressed as well at the same time as this procedure, she reports that the excess tissue from the mastectomy causes her discomfort and pain.  Job: Teacher, early years/pre, semiretired  PMH Significant for: Right breast cancer status post breast reconstruction with implant in place, hypertension, diabetes mellitus - most recent A1c 5.8, hyperlipidemia, hypothyroidism  Patient with recent CBC, normal.  CMP overall normal with slightly elevated BUN.  Patient was previously to an ASA 81 mg daily, but she reports she has not taken this and quite some time.  She was taking it for preventative reasons, it was not prescribed.  Of note she has a history of a left breast reduction/mastopexy on 06/25/2021 with Dr. Ulice Bold, she has requested an additional  reduction/lift due to asymmetry in her breasts related to her breast reconstruction.  Patient has been feeling well lately, no recent changes to her health.  Past Medical History: Allergies: No Known Allergies  Current Medications:  Current Outpatient Medications:    amitriptyline (ELAVIL) 25 MG tablet, Take 25 mg by mouth at bedtime., Disp: , Rfl:    Ascorbic Acid (VITAMIN C) 1000 MG tablet, Take 1,000 mg by mouth in the morning and at bedtime., Disp: , Rfl:    aspirin EC 81 MG tablet, Take 81 mg by mouth daily. Swallow whole., Disp: , Rfl:    Biotin 40981 MCG TABS, Take 10,000 mcg by mouth in the morning and at bedtime., Disp: , Rfl:    Calcium Carbonate-Vitamin D3 600-400 MG-UNIT TABS, Take 1-2 tablets by mouth See admin instructions. 1 tablet in the morning and 2 tablets at bedtime, Disp: , Rfl:    chlorthalidone (HYGROTON) 25 MG tablet, Take 25 mg by mouth in the morning., Disp: , Rfl:    ezetimibe (ZETIA) 10 MG tablet, Take 10 mg by mouth daily., Disp: , Rfl:    FLUoxetine (PROZAC) 40 MG capsule, Take 40 mg by mouth every morning. , Disp: , Rfl:    gabapentin (NEURONTIN) 300 MG capsule, Take 600 mg by mouth 2 (two) times daily., Disp: , Rfl:    hydrOXYzine (ATARAX) 10 MG tablet, Take 10 mg by mouth 3 (three) times daily as needed for itching., Disp: , Rfl:    ibuprofen (ADVIL) 200  MG tablet, Take 400-800 mg by mouth every 8 (eight) hours as needed (PAIN.)., Disp: , Rfl:    levothyroxine (SYNTHROID) 88 MCG tablet, Take 100 mcg by mouth daily before breakfast., Disp: , Rfl:    losartan (COZAAR) 100 MG tablet, Take 100 mg by mouth in the morning., Disp: , Rfl:    meclizine (ANTIVERT) 25 MG tablet, Take 25 mg by mouth 3 (three) times daily as needed for dizziness., Disp: , Rfl:    metFORMIN (GLUCOPHAGE-XR) 500 MG 24 hr tablet, Take 1,000 mg by mouth at bedtime., Disp: , Rfl:    Multiple Vitamin (MULTIVITAMIN WITH MINERALS) TABS tablet, Take 1 tablet by mouth daily., Disp: , Rfl:     ondansetron (ZOFRAN) 4 MG tablet, Take 1 tablet (4 mg total) by mouth every 8 (eight) hours as needed for nausea or vomiting., Disp: 20 tablet, Rfl: 0   PHENDIMETRAZINE TARTRATE CR PO, Take by mouth., Disp: , Rfl:    tiZANidine (ZANAFLEX) 4 MG tablet, Take 1 tablet (4 mg total) by mouth every 8 (eight) hours as needed for muscle spasms., Disp: 30 tablet, Rfl: 0   vitamin B-12 (CYANOCOBALAMIN) 1000 MCG tablet, Take 1,000 mcg by mouth in the morning., Disp: , Rfl:   Past Medical Problems: Past Medical History:  Diagnosis Date   Breast cancer (HCC)    Family history of breast cancer    Family history of colon cancer    Hepatitis    A-YEARS AGO   Hypercholesteremia    Hypertension    Hypothyroidism    Osteopenia of multiple sites    Pneumonia    Statin-induced myositis    Thyroid cancer (HCC)     Past Surgical History: Past Surgical History:  Procedure Laterality Date   BREAST BIOPSY Right 02/10/2021   [place] bx, heart marker,  INVASIVE MAMMARY CARCINOMA   BREAST BIOPSY Right 02/18/2021   stereo bx, distortion "COIL" clip-BENIGN MAMMARY PARENCHYMA WITH FOCAL PSEUDO   BREAST RECONSTRUCTION WITH PLACEMENT OF TISSUE EXPANDER AND FLEX HD (ACELLULAR HYDRATED DERMIS) Right 04/08/2021   Procedure: RIGHT BREAST RECONSTRUCTION WITH PLACEMENT OF TISSUE EXPANDER AND FLEX HD (ACELLULAR HYDRATED DERMIS);  Surgeon: Peggye Form, DO;  Location: [facility];  Service: Plastics;  Laterality: Right;   BREAST REDUCTION SURGERY Left 06/25/2021   Procedure: MAMMARY REDUCTION  (BREAST) LEFT BREAST;  Surgeon: Peggye Form, DO;  Location: [facility];  Service: Plastics;  Laterality: Left;   COLONOSCOPY W/ POLYPECTOMY     LIPOSUCTION WITH LIPOFILLING Right 01/14/2022   Procedure: LIPOSUCTION WITH LIPOFILLING;  Surgeon: Peggye Form, DO;  Location: [facility];  Service: Plastics;  Laterality: Right;   LUMBAR LAMINECTOMY/ DECOMPRESSION WITH MET-RX Right 09/16/2020    Procedure: Right Lumbar five Sacral one Minimally  invasive Discectomy with Metrex;  Surgeon: Jadene Pierini, MD;  Location: [facility];  Service: Neurosurgery;  Laterality: Right;   MASTECTOMY Right    MASTECTOMY WITH AXILLARY LYMPH NODE DISSECTION Right 04/08/2021   Procedure: MASTECTOMY WITH AXILLARY LYMPH NODE DISSECTION;  Surgeon: Earline Mayotte, MD;  Location: [facility];  Service: General;  Laterality: Right;   MASTOPEXY Left 06/25/2021   Procedure: MASTOPEXY LEFT BREAST;  Surgeon: Peggye Form, DO;  Location: [facility];  Service: Plastics;  Laterality: Left;   RECONSTRUCTION OF ANGULAR DEFORMITY,TOE Right 10/15/2021   Procedure: 40981 - RECONSTRUCTION hammertoe OVERLAPPING TOE - SECOND;  Surgeon: Rosetta Posner, DPM;  Location: [facility];  Service: Podiatry;  Laterality: Right;  REDUCTION MAMMAPLASTY     REMOVAL OF BILATERAL TISSUE EXPANDERS WITH PLACEMENT OF BILATERAL BREAST IMPLANTS Right 06/25/2021   Procedure: REMOVAL OF RIGH TISSUE EXPANDERS WITH PLACEMENT OF RIGHT BREAST IMPLANTS;  Surgeon: Peggye Form, DO;  Location:  [facility];  Service: Plastics;  Laterality: Right;  1.5 hours   THYROIDECTOMY Bilateral 03/26/2015   Procedure: THYROIDECTOMY;  Surgeon: Geanie Logan, MD;  Location: [facility];  Service: ENT;  Laterality: Bilateral;   WEIL OSTEOTOMY Right 10/15/2021   Procedure: 21308 - WEIL OSTEOTOMY - SECOND;  Surgeon: Rosetta Posner, DPM;  Location: [facility];  Service: Podiatry;  Laterality: Right;  Anesthesia: Choice preop pop block   WISDOM TOOTH EXTRACTION      Social History: Social History   Socioeconomic History   Marital status: Married    Spouse name: ,Link Snuffer   Number of children: Not on file   Years of education: Not on file   Highest education level: Not on file  Occupational History   Not on file  Tobacco Use   Smoking status: Former    Packs/day: 1.00    Years: 20.00    Additional pack years: 0.00    Total  pack years: 20.00    Types: Cigarettes    Quit date: 03/21/1983    Years since quitting: 39.5   Smokeless tobacco: Never  Vaping Use   Vaping Use: Never used  Substance and Sexual Activity   Alcohol use: Yes    Comment: social   Drug use: No   Sexual activity: Not Currently    Birth control/protection: Post-menopausal  Other Topics Concern   Not on file  Social History Narrative   Not on file   Social Determinants of Health   Financial Resource Strain: Not on file  Food Insecurity: Not on file  Transportation Needs: Not on file  Physical Activity: Not on file  Stress: Not on file  Social Connections: Not on file  Intimate Partner Violence: Not on file    Family History: Family History  Problem Relation Age of Onset   Alzheimer's disease Mother    Hernia Mother    Heart disease Father    Colon cancer Maternal Aunt        d. under 50   Breast cancer Paternal Aunt 40   Heart attack Maternal Grandfather    Diabetes Paternal Grandmother    Breast cancer Cousin 6    Review of Systems: Review of Systems  Constitutional: Negative.   Respiratory: Negative.    Cardiovascular: Negative.   Gastrointestinal: Negative.   Musculoskeletal: Negative.   Neurological: Negative.     Physical Exam: Vital Signs BP 115/74 (BP Location: Right Arm, Patient Position: Sitting, Cuff Size: Normal)   Pulse 85   Temp 98 F (36.7 C) (Oral)   Resp 16   Ht 4\' 10"  (1.473 m)   SpO2 96%   BMI 29.35 kg/m   Physical Exam  Constitutional:      General: Not in acute distress.    Appearance: Normal appearance. Not ill-appearing.  HENT:     Head: Normocephalic and atraumatic.  Eyes:     Pupils: Pupils are equal, round Neck:     Musculoskeletal: Normal range of motion.  Cardiovascular:     Rate and Rhythm: Normal rate    Pulses: Normal pulses.  Pulmonary:     Effort: Pulmonary effort is normal. No respiratory distress.  Musculoskeletal: Normal range of motion.  Skin:    General:  Skin is warm and  dry.     Findings: No erythema or rash.  Neurological:     General: No focal deficit present.     Mental Status: Alert and oriented to person, place, and time. Mental status is at baseline.     Motor: No weakness.  Psychiatric:        Mood and Affect: Mood normal.        Behavior: Behavior normal.    Assessment/Plan: The patient is scheduled for left breast reduction with Dr. Ulice Bold.  Risks, benefits, and alternatives of procedure discussed, questions answered and consent obtained.    Smoking Status: Non-smoker; Counseling Given?  N/A Last Mammogram: Patient had a left breast mammogram October 2023; Results: No evidence of malignancy.  Caprini Score: 7, high; Risk Factors include: Age, history of cancer, BMI > 25, and length of planned surgery. Recommendation for mechanical prophylaxis. Encourage early ambulation.   Post-op Rx sent to pharmacy: Oxycodone, Zofran, Keflex  Patient was provided with the breast reduction and General Surgical Risk consent document and Pain Medication Agreement prior to their appointment.  They had adequate time to read through the risk consent documents and Pain Medication Agreement. We also discussed them in person together during this preop appointment. All of their questions were answered to their satisfaction.  Recommended calling if they have any further questions.  Risk consent form and Pain Medication Agreement to be scanned into patient's chart.  The risk that can be encountered with breast reduction were discussed and include the following but not limited to these:  Breast asymmetry, fluid accumulation, firmness of the breast, inability to breast feed, loss of nipple or areola, skin loss, decrease or no nipple sensation, fat necrosis of the breast tissue, bleeding, infection, healing delay.  There are risks of anesthesia, changes to skin sensation and injury to nerves or blood vessels.  The muscle can be temporarily or permanently  injured.  You may have an allergic reaction to tape, suture, glue, blood products which can result in skin discoloration, swelling, pain, skin lesions, poor healing.  Any of these can lead to the need for revisonal surgery or stage procedures.  A reduction has potential to interfere with diagnostic procedures.  Nipple or breast piercing can increase risks of infection.  This procedure is best done when the breast is fully developed.  Changes in the breast will continue to occur over time.  Pregnancy can alter the outcomes of previous breast reduction surgery, weight gain and weigh loss can also effect the long term appearance.   We discussed submitting for reauthorization to add on excision of right lateral excess breast tissue from breast reconstruction with direct excision and possible liposuction.  Dr. Ulice Bold is aware and discussed plan with her.  Patient reports she is not taking phendimetrazine anymore, she is aware she would needed to have stopped it 1 week prior to surgery.  Electronically signed by: Kermit Balo Scheeler, PA-C 09/10/2022 11:16 AM

## 2022-09-10 NOTE — Progress Notes (Signed)
Patient ID: Gabrielle Davidson, female    DOB: 1950/04/06, 71 y.o.   MRN: [id number]  Chief Complaint  Patient presents with   Pre-op Exam      ICD-10-CM   1. Invasive carcinoma of breast (HCC)  C50.919     2. S/P breast reconstruction  Z98.890     3. Malignant neoplasm of upper-outer quadrant of right female breast, unspecified estrogen receptor status (HCC)  C50.411     4. Breast asymmetry following reconstructive surgery  N65.1        History of Present Illness: Gabrielle Davidson is a 72 y.o.  female  with a history of right breast cancer with breast reconstruction.  She presents for preoperative evaluation for upcoming procedure, left breast Reduction for symmetry, possible excision of right lateral breast skin and soft tissue with liposuction and direct excision scheduled for 10/07/2022 with Dr.  Ulice Bold  The patient has not had problems with anesthesia. No history of DVT/PE.  No family history of DVT/PE.  No family or personal history of bleeding or clotting disorders.  Patient is not currently taking any blood thinners.  No history of CVA/MI.   Reports today that she has some fullness in the right reconstructed breast that she would like addressed as well at the same time as this procedure, she reports that the excess tissue from the mastectomy causes her discomfort and pain.  Job: Teacher, early years/pre, semiretired  PMH Significant for: Right breast cancer status post breast reconstruction with implant in place, hypertension, diabetes mellitus - most recent A1c 5.8, hyperlipidemia, hypothyroidism  Patient with recent CBC, normal.  CMP overall normal with slightly elevated BUN.  Patient was previously to an ASA 81 mg daily, but she reports she has not taken this and quite some time.  She was taking it for preventative reasons, it was not prescribed.  Of note she has a history of a left breast reduction/mastopexy on 06/25/2021 with Dr. Ulice Bold, she has requested an additional  reduction/lift due to asymmetry in her breasts related to her breast reconstruction.  Patient has been feeling well lately, no recent changes to her health.  Past Medical History: Allergies: No Known Allergies  Current Medications:  Current Outpatient Medications:    amitriptyline (ELAVIL) 25 MG tablet, Take 25 mg by mouth at bedtime., Disp: , Rfl:    Ascorbic Acid (VITAMIN C) 1000 MG tablet, Take 1,000 mg by mouth in the morning and at bedtime., Disp: , Rfl:    aspirin EC 81 MG tablet, Take 81 mg by mouth daily. Swallow whole., Disp: , Rfl:    Biotin 40981 MCG TABS, Take 10,000 mcg by mouth in the morning and at bedtime., Disp: , Rfl:    Calcium Carbonate-Vitamin D3 600-400 MG-UNIT TABS, Take 1-2 tablets by mouth See admin instructions. 1 tablet in the morning and 2 tablets at bedtime, Disp: , Rfl:    chlorthalidone (HYGROTON) 25 MG tablet, Take 25 mg by mouth in the morning., Disp: , Rfl:    ezetimibe (ZETIA) 10 MG tablet, Take 10 mg by mouth daily., Disp: , Rfl:    FLUoxetine (PROZAC) 40 MG capsule, Take 40 mg by mouth every morning. , Disp: , Rfl:    gabapentin (NEURONTIN) 300 MG capsule, Take 600 mg by mouth 2 (two) times daily., Disp: , Rfl:    hydrOXYzine (ATARAX) 10 MG tablet, Take 10 mg by mouth 3 (three) times daily as needed for itching., Disp: , Rfl:    ibuprofen (ADVIL) 200  MG tablet, Take 400-800 mg by mouth every 8 (eight) hours as needed (PAIN.)., Disp: , Rfl:    levothyroxine (SYNTHROID) 88 MCG tablet, Take 100 mcg by mouth daily before breakfast., Disp: , Rfl:    losartan (COZAAR) 100 MG tablet, Take 100 mg by mouth in the morning., Disp: , Rfl:    meclizine (ANTIVERT) 25 MG tablet, Take 25 mg by mouth 3 (three) times daily as needed for dizziness., Disp: , Rfl:    metFORMIN (GLUCOPHAGE-XR) 500 MG 24 hr tablet, Take 1,000 mg by mouth at bedtime., Disp: , Rfl:    Multiple Vitamin (MULTIVITAMIN WITH MINERALS) TABS tablet, Take 1 tablet by mouth daily., Disp: , Rfl:     ondansetron (ZOFRAN) 4 MG tablet, Take 1 tablet (4 mg total) by mouth every 8 (eight) hours as needed for nausea or vomiting., Disp: 20 tablet, Rfl: 0   PHENDIMETRAZINE TARTRATE CR PO, Take by mouth., Disp: , Rfl:    tiZANidine (ZANAFLEX) 4 MG tablet, Take 1 tablet (4 mg total) by mouth every 8 (eight) hours as needed for muscle spasms., Disp: 30 tablet, Rfl: 0   vitamin B-12 (CYANOCOBALAMIN) 1000 MCG tablet, Take 1,000 mcg by mouth in the morning., Disp: , Rfl:   Past Medical Problems: Past Medical History:  Diagnosis Date   Breast cancer (HCC)    Family history of breast cancer    Family history of colon cancer    Hepatitis    A-YEARS AGO   Hypercholesteremia    Hypertension    Hypothyroidism    Osteopenia of multiple sites    Pneumonia    Statin-induced myositis    Thyroid cancer (HCC)     Past Surgical History: Past Surgical History:  Procedure Laterality Date   BREAST BIOPSY Right 02/10/2021   [place] bx, heart marker,  INVASIVE MAMMARY CARCINOMA   BREAST BIOPSY Right 02/18/2021   stereo bx, distortion "COIL" clip-BENIGN MAMMARY PARENCHYMA WITH FOCAL PSEUDO   BREAST RECONSTRUCTION WITH PLACEMENT OF TISSUE EXPANDER AND FLEX HD (ACELLULAR HYDRATED DERMIS) Right 04/08/2021   Procedure: RIGHT BREAST RECONSTRUCTION WITH PLACEMENT OF TISSUE EXPANDER AND FLEX HD (ACELLULAR HYDRATED DERMIS);  Surgeon: Peggye Form, DO;  Location: [facility];  Service: Plastics;  Laterality: Right;   BREAST REDUCTION SURGERY Left 06/25/2021   Procedure: MAMMARY REDUCTION  (BREAST) LEFT BREAST;  Surgeon: Peggye Form, DO;  Location: [facility];  Service: Plastics;  Laterality: Left;   COLONOSCOPY W/ POLYPECTOMY     LIPOSUCTION WITH LIPOFILLING Right 01/14/2022   Procedure: LIPOSUCTION WITH LIPOFILLING;  Surgeon: Peggye Form, DO;  Location: [facility];  Service: Plastics;  Laterality: Right;   LUMBAR LAMINECTOMY/ DECOMPRESSION WITH MET-RX Right 09/16/2020    Procedure: Right Lumbar five Sacral one Minimally  invasive Discectomy with Metrex;  Surgeon: Jadene Pierini, MD;  Location: [facility];  Service: Neurosurgery;  Laterality: Right;   MASTECTOMY Right    MASTECTOMY WITH AXILLARY LYMPH NODE DISSECTION Right 04/08/2021   Procedure: MASTECTOMY WITH AXILLARY LYMPH NODE DISSECTION;  Surgeon: Earline Mayotte, MD;  Location: [facility];  Service: General;  Laterality: Right;   MASTOPEXY Left 06/25/2021   Procedure: MASTOPEXY LEFT BREAST;  Surgeon: Peggye Form, DO;  Location: [facility];  Service: Plastics;  Laterality: Left;   RECONSTRUCTION OF ANGULAR DEFORMITY,TOE Right 10/15/2021   Procedure: 40981 - RECONSTRUCTION hammertoe OVERLAPPING TOE - SECOND;  Surgeon: Rosetta Posner, DPM;  Location: [facility];  Service: Podiatry;  Laterality: Right;  REDUCTION MAMMAPLASTY     REMOVAL OF BILATERAL TISSUE EXPANDERS WITH PLACEMENT OF BILATERAL BREAST IMPLANTS Right 06/25/2021   Procedure: REMOVAL OF RIGH TISSUE EXPANDERS WITH PLACEMENT OF RIGHT BREAST IMPLANTS;  Surgeon: Peggye Form, DO;  Location:  [facility];  Service: Plastics;  Laterality: Right;  1.5 hours   THYROIDECTOMY Bilateral 03/26/2015   Procedure: THYROIDECTOMY;  Surgeon: Geanie Logan, MD;  Location: [facility];  Service: ENT;  Laterality: Bilateral;   WEIL OSTEOTOMY Right 10/15/2021   Procedure: 21308 - WEIL OSTEOTOMY - SECOND;  Surgeon: Rosetta Posner, DPM;  Location: [facility];  Service: Podiatry;  Laterality: Right;  Anesthesia: Choice preop pop block   WISDOM TOOTH EXTRACTION      Social History: Social History   Socioeconomic History   Marital status: Married    Spouse name: ,Link Snuffer   Number of children: Not on file   Years of education: Not on file   Highest education level: Not on file  Occupational History   Not on file  Tobacco Use   Smoking status: Former    Packs/day: 1.00    Years: 20.00    Additional pack years: 0.00    Total  pack years: 20.00    Types: Cigarettes    Quit date: 03/21/1983    Years since quitting: 39.5   Smokeless tobacco: Never  Vaping Use   Vaping Use: Never used  Substance and Sexual Activity   Alcohol use: Yes    Comment: social   Drug use: No   Sexual activity: Not Currently    Birth control/protection: Post-menopausal  Other Topics Concern   Not on file  Social History Narrative   Not on file   Social Determinants of Health   Financial Resource Strain: Not on file  Food Insecurity: Not on file  Transportation Needs: Not on file  Physical Activity: Not on file  Stress: Not on file  Social Connections: Not on file  Intimate Partner Violence: Not on file    Family History: Family History  Problem Relation Age of Onset   Alzheimer's disease Mother    Hernia Mother    Heart disease Father    Colon cancer Maternal Aunt        d. under 50   Breast cancer Paternal Aunt 40   Heart attack Maternal Grandfather    Diabetes Paternal Grandmother    Breast cancer Cousin 6    Review of Systems: Review of Systems  Constitutional: Negative.   Respiratory: Negative.    Cardiovascular: Negative.   Gastrointestinal: Negative.   Musculoskeletal: Negative.   Neurological: Negative.     Physical Exam: Vital Signs BP 115/74 (BP Location: Right Arm, Patient Position: Sitting, Cuff Size: Normal)   Pulse 85   Temp 98 F (36.7 C) (Oral)   Resp 16   Ht 4\' 10"  (1.473 m)   SpO2 96%   BMI 29.35 kg/m   Physical Exam  Constitutional:      General: Not in acute distress.    Appearance: Normal appearance. Not ill-appearing.  HENT:     Head: Normocephalic and atraumatic.  Eyes:     Pupils: Pupils are equal, round Neck:     Musculoskeletal: Normal range of motion.  Cardiovascular:     Rate and Rhythm: Normal rate    Pulses: Normal pulses.  Pulmonary:     Effort: Pulmonary effort is normal. No respiratory distress.  Musculoskeletal: Normal range of motion.  Skin:    General:  Skin is warm and  dry.     Findings: No erythema or rash.  Neurological:     General: No focal deficit present.     Mental Status: Alert and oriented to person, place, and time. Mental status is at baseline.     Motor: No weakness.  Psychiatric:        Mood and Affect: Mood normal.        Behavior: Behavior normal.    Assessment/Plan: The patient is scheduled for left breast reduction with Dr. Ulice Bold.  Risks, benefits, and alternatives of procedure discussed, questions answered and consent obtained.    Smoking Status: Non-smoker; Counseling Given?  N/A Last Mammogram: Patient had a left breast mammogram October 2023; Results: No evidence of malignancy.  Caprini Score: 7, high; Risk Factors include: Age, history of cancer, BMI > 25, and length of planned surgery. Recommendation for mechanical prophylaxis. Encourage early ambulation.   Post-op Rx sent to pharmacy: Oxycodone, Zofran, Keflex  Patient was provided with the breast reduction and General Surgical Risk consent document and Pain Medication Agreement prior to their appointment.  They had adequate time to read through the risk consent documents and Pain Medication Agreement. We also discussed them in person together during this preop appointment. All of their questions were answered to their satisfaction.  Recommended calling if they have any further questions.  Risk consent form and Pain Medication Agreement to be scanned into patient's chart.  The risk that can be encountered with breast reduction were discussed and include the following but not limited to these:  Breast asymmetry, fluid accumulation, firmness of the breast, inability to breast feed, loss of nipple or areola, skin loss, decrease or no nipple sensation, fat necrosis of the breast tissue, bleeding, infection, healing delay.  There are risks of anesthesia, changes to skin sensation and injury to nerves or blood vessels.  The muscle can be temporarily or permanently  injured.  You may have an allergic reaction to tape, suture, glue, blood products which can result in skin discoloration, swelling, pain, skin lesions, poor healing.  Any of these can lead to the need for revisonal surgery or stage procedures.  A reduction has potential to interfere with diagnostic procedures.  Nipple or breast piercing can increase risks of infection.  This procedure is best done when the breast is fully developed.  Changes in the breast will continue to occur over time.  Pregnancy can alter the outcomes of previous breast reduction surgery, weight gain and weigh loss can also effect the long term appearance.   We discussed submitting for reauthorization to add on excision of right lateral excess breast tissue from breast reconstruction with direct excision and possible liposuction.  Dr. Ulice Bold is aware and discussed plan with her.  Patient reports she is not taking phendimetrazine anymore, she is aware she would needed to have stopped it 1 week prior to surgery.  Electronically signed by: Kermit Balo Lener Ventresca, PA-C 09/10/2022 11:16 AM

## 2022-09-12 ENCOUNTER — Telehealth: Payer: Self-pay | Admitting: *Deleted

## 2022-09-12 NOTE — Telephone Encounter (Signed)
Patient requesting to add on R breast excision tissue w/ lipo for fullness and symmetry post reconstruction  Per [employer] - No auth req [id number] (will call Monday to confirm coverage)  [id number] pending under Berkley Harvey [id number]

## 2022-09-30 ENCOUNTER — Encounter (HOSPITAL_BASED_OUTPATIENT_CLINIC_OR_DEPARTMENT_OTHER): Payer: Self-pay | Admitting: Plastic Surgery

## 2022-10-04 ENCOUNTER — Encounter (HOSPITAL_BASED_OUTPATIENT_CLINIC_OR_DEPARTMENT_OTHER)
Admission: RE | Admit: 2022-10-04 | Discharge: 2022-10-04 | Disposition: A | Discharge status: Home / Self Care | Payer: Medicare HMO | Source: Ambulatory Visit | Attending: Plastic Surgery | Admitting: Plastic Surgery

## 2022-10-04 DIAGNOSIS — G709 Myoneural disorder, unspecified: Secondary | ICD-10-CM | POA: Diagnosis not present

## 2022-10-04 DIAGNOSIS — Z853 Personal history of malignant neoplasm of breast: Secondary | ICD-10-CM | POA: Diagnosis not present

## 2022-10-04 DIAGNOSIS — Z87891 Personal history of nicotine dependence: Secondary | ICD-10-CM | POA: Diagnosis not present

## 2022-10-04 DIAGNOSIS — Z9889 Other specified postprocedural states: Secondary | ICD-10-CM | POA: Diagnosis not present

## 2022-10-04 DIAGNOSIS — F4320 Adjustment disorder, unspecified: Secondary | ICD-10-CM | POA: Diagnosis not present

## 2022-10-04 DIAGNOSIS — N651 Disproportion of reconstructed breast: Secondary | ICD-10-CM | POA: Diagnosis present

## 2022-10-04 DIAGNOSIS — E119 Type 2 diabetes mellitus without complications: Secondary | ICD-10-CM | POA: Diagnosis not present

## 2022-10-04 DIAGNOSIS — Z7984 Long term (current) use of oral hypoglycemic drugs: Secondary | ICD-10-CM | POA: Diagnosis not present

## 2022-10-04 DIAGNOSIS — I10 Essential (primary) hypertension: Secondary | ICD-10-CM | POA: Diagnosis not present

## 2022-10-04 DIAGNOSIS — E039 Hypothyroidism, unspecified: Secondary | ICD-10-CM | POA: Diagnosis not present

## 2022-10-04 DIAGNOSIS — N6489 Other specified disorders of breast: Secondary | ICD-10-CM | POA: Diagnosis not present

## 2022-10-04 LAB — BASIC METABOLIC PANEL
Anion gap: 6 (ref 5–15)
BUN: 17 mg/dL (ref 8–23)
CO2: 26 mmol/L (ref 22–32)
Calcium: 9.3 mg/dL (ref 8.9–10.3)
Chloride: 105 mmol/L (ref 98–111)
Creatinine, Ser: 0.59 mg/dL (ref 0.44–1.00)
GFR, Estimated: >60 mL/min (ref >60)
Glucose, Bld: 69 mg/dL — ABNORMAL LOW (ref 70–99)
Potassium: 4 mmol/L (ref 3.5–5.1)
Sodium: 137 mmol/L (ref 135–145)

## 2022-10-06 NOTE — Anesthesia Preprocedure Evaluation (Signed)
Anesthesia Evaluation  Patient identified by MRN, date of birth, ID band Patient awake    Reviewed: Allergy & Precautions, NPO status , Patient's Chart, lab work & pertinent test results, reviewed documented beta blocker date and time   History of Anesthesia Complications Negative for: history of anesthetic complications  Airway Mallampati: II  TM Distance: >3 FB Neck ROM: Full    Dental no notable dental hx. (+) Dental Advisory Given, Teeth Intact   Pulmonary former smoker   Pulmonary exam normal        Cardiovascular hypertension, Pt. on medications Normal cardiovascular exam  EKG 10/04/22 SB, non specific T wave abnormality   Neuro/Psych  PSYCHIATRIC DISORDERS      Adjustment disorder Neuromuscular disease    GI/Hepatic negative GI ROS,,,(+) Hepatitis -, AHx/o Hep A years ago   Endo/Other  diabetes, Well Controlled, Type 2, Oral Hypoglycemic AgentsHypothyroidism  Breast Asymmetry S/P reconstruction Hx/o Breast Ca HLD  Renal/GU negative Renal ROS  negative genitourinary   Musculoskeletal negative musculoskeletal ROS (+)    Abdominal   Peds  Hematology negative hematology ROS (+)   Anesthesia Other Findings   Reproductive/Obstetrics                              Anesthesia Physical Anesthesia Plan  ASA: 2  Anesthesia Plan: General   Post-op Pain Management: Tylenol PO (pre-op)* and Toradol IV (intra-op)*   Induction: Intravenous  PONV Risk Score and Plan: 4 or greater and Ondansetron, Dexamethasone, Scopolamine patch - Pre-op and Treatment may vary due to age or medical condition  Airway Management Planned: LMA  Additional Equipment: None  Intra-op Plan:   Post-operative Plan: Extubation in OR  Informed Consent: I have reviewed the patients History and Physical, chart, labs and discussed the procedure including the risks, benefits and alternatives for the proposed  anesthesia with the patient or authorized representative who has indicated his/her understanding and acceptance.     Dental advisory given  Plan Discussed with: Anesthesiologist and CRNA  Anesthesia Plan Comments:          Anesthesia Quick Evaluation

## 2022-10-07 ENCOUNTER — Ambulatory Visit (HOSPITAL_BASED_OUTPATIENT_CLINIC_OR_DEPARTMENT_OTHER): Payer: Medicare HMO | Admitting: Anesthesiology

## 2022-10-07 ENCOUNTER — Encounter (HOSPITAL_BASED_OUTPATIENT_CLINIC_OR_DEPARTMENT_OTHER)
Admission: RE | Disposition: A | Discharge status: Home / Self Care | Payer: Self-pay | Source: Home / Self Care | Attending: Plastic Surgery

## 2022-10-07 ENCOUNTER — Encounter (HOSPITAL_BASED_OUTPATIENT_CLINIC_OR_DEPARTMENT_OTHER): Payer: Self-pay | Admitting: Plastic Surgery

## 2022-10-07 ENCOUNTER — Other Ambulatory Visit: Payer: Self-pay

## 2022-10-07 ENCOUNTER — Ambulatory Visit (HOSPITAL_BASED_OUTPATIENT_CLINIC_OR_DEPARTMENT_OTHER)
Admission: RE | Admit: 2022-10-07 | Discharge: 2022-10-07 | Disposition: A | Discharge status: Home / Self Care | Payer: Medicare HMO | Attending: Plastic Surgery | Admitting: Plastic Surgery

## 2022-10-07 DIAGNOSIS — F4320 Adjustment disorder, unspecified: Secondary | ICD-10-CM | POA: Insufficient documentation

## 2022-10-07 DIAGNOSIS — N62 Hypertrophy of breast: Secondary | ICD-10-CM

## 2022-10-07 DIAGNOSIS — M542 Cervicalgia: Secondary | ICD-10-CM

## 2022-10-07 DIAGNOSIS — Z853 Personal history of malignant neoplasm of breast: Secondary | ICD-10-CM

## 2022-10-07 DIAGNOSIS — N651 Disproportion of reconstructed breast: Secondary | ICD-10-CM

## 2022-10-07 DIAGNOSIS — Z87891 Personal history of nicotine dependence: Secondary | ICD-10-CM | POA: Insufficient documentation

## 2022-10-07 DIAGNOSIS — Z7984 Long term (current) use of oral hypoglycemic drugs: Secondary | ICD-10-CM | POA: Insufficient documentation

## 2022-10-07 DIAGNOSIS — G709 Myoneural disorder, unspecified: Secondary | ICD-10-CM | POA: Insufficient documentation

## 2022-10-07 DIAGNOSIS — N6489 Other specified disorders of breast: Secondary | ICD-10-CM | POA: Insufficient documentation

## 2022-10-07 DIAGNOSIS — E119 Type 2 diabetes mellitus without complications: Secondary | ICD-10-CM | POA: Insufficient documentation

## 2022-10-07 DIAGNOSIS — I10 Essential (primary) hypertension: Secondary | ICD-10-CM | POA: Insufficient documentation

## 2022-10-07 DIAGNOSIS — E039 Hypothyroidism, unspecified: Secondary | ICD-10-CM | POA: Insufficient documentation

## 2022-10-07 DIAGNOSIS — Z9889 Other specified postprocedural states: Secondary | ICD-10-CM | POA: Insufficient documentation

## 2022-10-07 HISTORY — PX: UNILATERAL BREAST REDUCTION: SHX6885

## 2022-10-07 HISTORY — PX: BREAST CYST EXCISION: SHX579

## 2022-10-07 HISTORY — PX: LIPOSUCTION: SHX10

## 2022-10-07 SURGERY — MAMMOPLASTY, REDUCTION, UNILATERAL
Anesthesia: General | Site: Breast | Laterality: Right

## 2022-10-07 MED ORDER — PROPOFOL 10 MG/ML IV BOLUS
INTRAVENOUS | Status: DC | PRN
  Administered 2022-10-07: 100 mg via INTRAVENOUS

## 2022-10-07 MED ORDER — OXYCODONE HCL 5 MG PO TABS
ORAL_TABLET | ORAL | Status: AC
  Filled 2022-10-07: qty 1

## 2022-10-07 MED ORDER — EPHEDRINE SULFATE (PRESSORS) 50 MG/ML IJ SOLN
INTRAMUSCULAR | Status: DC | PRN
  Administered 2022-10-07: 10 mg via INTRAVENOUS

## 2022-10-07 MED ORDER — EPHEDRINE 5 MG/ML INJ
INTRAVENOUS | Status: AC
  Filled 2022-10-07: qty 5

## 2022-10-07 MED ORDER — GLYCOPYRROLATE PF 0.2 MG/ML IJ SOSY
PREFILLED_SYRINGE | INTRAMUSCULAR | Status: AC
  Filled 2022-10-07: qty 1

## 2022-10-07 MED ORDER — ACETAMINOPHEN 500 MG PO TABS
ORAL_TABLET | ORAL | Status: AC
  Filled 2022-10-07: qty 2

## 2022-10-07 MED ORDER — FENTANYL CITRATE (PF) 100 MCG/2ML IJ SOLN
INTRAMUSCULAR | Status: AC
  Filled 2022-10-07: qty 2

## 2022-10-07 MED ORDER — ONDANSETRON HCL 4 MG/2ML IJ SOLN
INTRAMUSCULAR | Status: AC
  Filled 2022-10-07: qty 2

## 2022-10-07 MED ORDER — LACTATED RINGERS IV SOLN: INTRAVENOUS | Status: DC

## 2022-10-07 MED ORDER — PHENYLEPHRINE 80 MCG/ML (10ML) SYRINGE FOR IV PUSH (FOR BLOOD PRESSURE SUPPORT)
PREFILLED_SYRINGE | INTRAVENOUS | Status: AC
  Filled 2022-10-07: qty 10

## 2022-10-07 MED ORDER — SUCCINYLCHOLINE CHLORIDE 200 MG/10ML IV SOSY
PREFILLED_SYRINGE | INTRAVENOUS | Status: AC
  Filled 2022-10-07: qty 10

## 2022-10-07 MED ORDER — SCOPOLAMINE 1 MG/3DAYS TD PT72
1.0000 | MEDICATED_PATCH | TRANSDERMAL | Status: DC
  Administered 2022-10-07: 1.5 mg via TRANSDERMAL

## 2022-10-07 MED ORDER — LIDOCAINE HCL (CARDIAC) PF 100 MG/5ML IV SOSY
PREFILLED_SYRINGE | INTRAVENOUS | Status: DC | PRN
  Administered 2022-10-07: 60 mg via INTRAVENOUS

## 2022-10-07 MED ORDER — LIDOCAINE HCL 1 % IJ SOLN
INTRAVENOUS | Status: DC | PRN
  Administered 2022-10-07: 250 mL

## 2022-10-07 MED ORDER — FENTANYL CITRATE (PF) 100 MCG/2ML IJ SOLN
25.0000 ug | INTRAMUSCULAR | Status: DC | PRN
  Administered 2022-10-07: 25 ug via INTRAVENOUS
  Administered 2022-10-07: 50 ug via INTRAVENOUS

## 2022-10-07 MED ORDER — CHLORHEXIDINE GLUCONATE CLOTH 2 % EX PADS: 6.0000 | MEDICATED_PAD | Freq: Once | CUTANEOUS | Status: DC

## 2022-10-07 MED ORDER — ACETAMINOPHEN 500 MG PO TABS
1000.0000 mg | ORAL_TABLET | Freq: Once | ORAL | Status: AC
  Administered 2022-10-07: 1000 mg via ORAL

## 2022-10-07 MED ORDER — CEFAZOLIN SODIUM-DEXTROSE 2-4 GM/100ML-% IV SOLN
2.0000 g | INTRAVENOUS | Status: AC
  Administered 2022-10-07: 2 g via INTRAVENOUS

## 2022-10-07 MED ORDER — MIDAZOLAM HCL 2 MG/2ML IJ SOLN
INTRAMUSCULAR | Status: AC
  Filled 2022-10-07: qty 2

## 2022-10-07 MED ORDER — FENTANYL CITRATE (PF) 100 MCG/2ML IJ SOLN
INTRAMUSCULAR | Status: DC | PRN
  Administered 2022-10-07 (×2): 50 ug via INTRAVENOUS

## 2022-10-07 MED ORDER — LIDOCAINE 2% (20 MG/ML) 5 ML SYRINGE
INTRAMUSCULAR | Status: AC
  Filled 2022-10-07: qty 5

## 2022-10-07 MED ORDER — DEXAMETHASONE SODIUM PHOSPHATE 10 MG/ML IJ SOLN
INTRAMUSCULAR | Status: AC
  Filled 2022-10-07: qty 1

## 2022-10-07 MED ORDER — CEFAZOLIN SODIUM-DEXTROSE 2-4 GM/100ML-% IV SOLN
INTRAVENOUS | Status: AC
  Filled 2022-10-07: qty 100

## 2022-10-07 MED ORDER — SCOPOLAMINE 1 MG/3DAYS TD PT72
MEDICATED_PATCH | TRANSDERMAL | Status: AC
  Filled 2022-10-07: qty 1

## 2022-10-07 MED ORDER — MIDAZOLAM HCL 5 MG/5ML IJ SOLN
INTRAMUSCULAR | Status: DC | PRN
  Administered 2022-10-07: 1 mg via INTRAVENOUS

## 2022-10-07 MED ORDER — OXYCODONE HCL 5 MG/5ML PO SOLN: 5.0000 mg | Freq: Once | ORAL | Status: AC | PRN

## 2022-10-07 MED ORDER — OXYCODONE HCL 5 MG PO TABS
5.0000 mg | ORAL_TABLET | Freq: Once | ORAL | Status: AC | PRN
  Administered 2022-10-07: 5 mg via ORAL

## 2022-10-07 MED ORDER — GLYCOPYRROLATE 0.2 MG/ML IJ SOLN
INTRAMUSCULAR | Status: DC | PRN
  Administered 2022-10-07: .2 mg via INTRAVENOUS

## 2022-10-07 MED ORDER — LIDOCAINE-EPINEPHRINE 1 %-1:100000 IJ SOLN
INTRAMUSCULAR | Status: DC | PRN
  Administered 2022-10-07: 4 mL

## 2022-10-07 MED ORDER — AMISULPRIDE (ANTIEMETIC) 5 MG/2ML IV SOLN: 10.0000 mg | Freq: Once | INTRAVENOUS | Status: DC | PRN

## 2022-10-07 MED ORDER — VASHE WOUND IRRIGATION OPTIME
TOPICAL | Status: DC | PRN
  Administered 2022-10-07: 34 [oz_av]

## 2022-10-07 MED ORDER — DEXAMETHASONE SODIUM PHOSPHATE 4 MG/ML IJ SOLN
INTRAMUSCULAR | Status: DC | PRN
  Administered 2022-10-07: 5 mg via INTRAVENOUS

## 2022-10-07 MED ORDER — ACETAMINOPHEN 10 MG/ML IV SOLN: 1000.0000 mg | Freq: Once | INTRAVENOUS | Status: DC | PRN

## 2022-10-07 MED ORDER — ATROPINE SULFATE 0.4 MG/ML IV SOLN
INTRAVENOUS | Status: AC
  Filled 2022-10-07: qty 1

## 2022-10-07 MED ORDER — ONDANSETRON HCL 4 MG/2ML IJ SOLN
INTRAMUSCULAR | Status: DC | PRN
  Administered 2022-10-07: 4 mg via INTRAVENOUS

## 2022-10-07 SURGICAL SUPPLY — 80 items
ADH SKN CLS APL DERMABOND .7 (GAUZE/BANDAGES/DRESSINGS) ×8
BAG DECANTER FOR FLEXI CONT (MISCELLANEOUS) ×2 IMPLANT
BINDER ABDOMINAL 10 UNV 27-48 (MISCELLANEOUS) IMPLANT
BINDER ABDOMINAL 12 SM 30-45 (SOFTGOODS) IMPLANT
BINDER ABDOMINAL 9 SM 30-45 (SOFTGOODS) IMPLANT
BINDER BREAST LRG (GAUZE/BANDAGES/DRESSINGS) IMPLANT
BINDER BREAST MEDIUM (GAUZE/BANDAGES/DRESSINGS) IMPLANT
BINDER BREAST XLRG (GAUZE/BANDAGES/DRESSINGS) IMPLANT
BINDER BREAST XXLRG (GAUZE/BANDAGES/DRESSINGS) IMPLANT
BIOPATCH RED 1 DISK 7.0 (GAUZE/BANDAGES/DRESSINGS) IMPLANT
BLADE HEX COATED 2.75 (ELECTRODE) ×2 IMPLANT
BLADE KNIFE PERSONA 10 (BLADE) ×4 IMPLANT
BLADE SURG 10 STRL SS (BLADE) IMPLANT
BLADE SURG 15 STRL LF DISP TIS (BLADE) ×2 IMPLANT
BLADE SURG 15 STRL SS (BLADE) ×2
BNDG GAUZE DERMACEA FLUFF 4 (GAUZE/BANDAGES/DRESSINGS) ×4 IMPLANT
BNDG GZE DERMACEA 4 6PLY (GAUZE/BANDAGES/DRESSINGS)
CANISTER SUCT 1200ML W/VALVE (MISCELLANEOUS) ×2 IMPLANT
CLEANSER WND VASHE 34 (WOUND CARE) IMPLANT
COVER BACK TABLE 60X90IN (DRAPES) ×2 IMPLANT
COVER MAYO STAND STRL (DRAPES) ×2 IMPLANT
DERMABOND ADVANCED .7 DNX12 (GAUZE/BANDAGES/DRESSINGS) ×4 IMPLANT
DRAIN CHANNEL 19F RND (DRAIN) IMPLANT
DRAPE LAPAROSCOPIC ABDOMINAL (DRAPES) ×2 IMPLANT
DRAPE UTILITY XL STRL (DRAPES) IMPLANT
DRESSING MEPILEX FLEX 4X4 (GAUZE/BANDAGES/DRESSINGS) IMPLANT
DRSG MEPILEX FLEX 4X4 (GAUZE/BANDAGES/DRESSINGS)
DRSG MEPILEX POST OP 4X8 (GAUZE/BANDAGES/DRESSINGS) ×4 IMPLANT
ELECT BLADE 4.0 EZ CLEAN MEGAD (MISCELLANEOUS) ×2
ELECT REM PT RETURN 9FT ADLT (ELECTROSURGICAL) ×2
ELECTRODE BLDE 4.0 EZ CLN MEGD (MISCELLANEOUS) ×2 IMPLANT
ELECTRODE REM PT RTRN 9FT ADLT (ELECTROSURGICAL) ×2 IMPLANT
EVACUATOR SILICONE 100CC (DRAIN) IMPLANT
GAUZE PAD ABD 8X10 STRL (GAUZE/BANDAGES/DRESSINGS) ×4 IMPLANT
GLOVE BIO SURGEON STRL SZ 6.5 (GLOVE) ×6 IMPLANT
GLOVE BIO SURGEON STRL SZ7.5 (GLOVE) ×2 IMPLANT
GLOVE BIOGEL PI IND STRL 7.0 (GLOVE) IMPLANT
GLOVE SURG SS PI 7.0 STRL IVOR (GLOVE) IMPLANT
GOWN STRL REUS W/ TWL LRG LVL3 (GOWN DISPOSABLE) ×4 IMPLANT
GOWN STRL REUS W/ TWL XL LVL3 (GOWN DISPOSABLE) ×2 IMPLANT
GOWN STRL REUS W/TWL LRG LVL3 (GOWN DISPOSABLE) ×4
GOWN STRL REUS W/TWL XL LVL3 (GOWN DISPOSABLE) ×4
LINER CANISTER 1000CC FLEX (MISCELLANEOUS) ×2 IMPLANT
NDL FILTER BLUNT 18X1 1/2 (NEEDLE) IMPLANT
NDL HYPO 25X1 1.5 SAFETY (NEEDLE) ×2 IMPLANT
NDL SAFETY ECLIP 18X1.5 (MISCELLANEOUS) ×2 IMPLANT
NEEDLE FILTER BLUNT 18X1 1/2 (NEEDLE) ×2 IMPLANT
NEEDLE HYPO 25X1 1.5 SAFETY (NEEDLE) ×2 IMPLANT
NS IRRIG 1000ML POUR BTL (IV SOLUTION) ×2 IMPLANT
PACK BASIN DAY SURGERY FS (CUSTOM PROCEDURE TRAY) ×2 IMPLANT
PAD ALCOHOL SWAB (MISCELLANEOUS) ×2 IMPLANT
PAD FOAM SILICONE BACKED (GAUZE/BANDAGES/DRESSINGS) ×2 IMPLANT
PENCIL SMOKE EVACUATOR (MISCELLANEOUS) ×2 IMPLANT
PIN SAFETY STERILE (MISCELLANEOUS) IMPLANT
SLEEVE SCD COMPRESS KNEE MED (STOCKING) ×2 IMPLANT
SPIKE FLUID TRANSFER (MISCELLANEOUS) IMPLANT
SPONGE T-LAP 18X18 ~~LOC~~+RFID (SPONGE) ×4 IMPLANT
STRIP SUTURE WOUND CLOSURE 1/2 (MISCELLANEOUS) ×4 IMPLANT
SUT MNCRL AB 4-0 PS2 18 (SUTURE) ×8 IMPLANT
SUT MON AB 3-0 SH 27 (SUTURE) ×8
SUT MON AB 3-0 SH27 (SUTURE) ×8 IMPLANT
SUT MON AB 5-0 PS2 18 (SUTURE) ×4 IMPLANT
SUT PDS 3-0 CT2 (SUTURE) ×8
SUT PDS II 3-0 CT2 27 ABS (SUTURE) ×8 IMPLANT
SUT SILK 3 0 PS 1 (SUTURE) IMPLANT
SUT VIC AB 3-0 SH 27 (SUTURE)
SUT VIC AB 3-0 SH 27X BRD (SUTURE) IMPLANT
SUT VIC AB 4-0 PS2 18 (SUTURE) IMPLANT
SYR 50ML LL SCALE MARK (SYRINGE) ×2 IMPLANT
SYR BULB IRRIG 60ML STRL (SYRINGE) ×2 IMPLANT
SYR CONTROL 10ML LL (SYRINGE) ×2 IMPLANT
SYR TB 1ML LL NO SAFETY (SYRINGE) ×2 IMPLANT
TAPE MEASURE VINYL STERILE (MISCELLANEOUS) IMPLANT
TOWEL GREEN STERILE FF (TOWEL DISPOSABLE) ×4 IMPLANT
TRAY DSU PREP LF (CUSTOM PROCEDURE TRAY) ×2 IMPLANT
TUBE CONNECTING 20X1/4 (TUBING) ×2 IMPLANT
TUBING INFILTRATION IT-10001 (TUBING) IMPLANT
TUBING SET GRADUATE ASPIR 12FT (MISCELLANEOUS) ×2 IMPLANT
UNDERPAD 30X36 HEAVY ABSORB (UNDERPADS AND DIAPERS) ×4 IMPLANT
YANKAUER SUCT BULB TIP NO VENT (SUCTIONS) ×2 IMPLANT

## 2022-10-07 NOTE — Transfer of Care (Signed)
Immediate Anesthesia Transfer of Care Note  Patient: Gabrielle Davidson  Procedure(s) Performed: Left breast reduction for symmetry (Left: Breast) EXCISION OF RIGHT LATERAL BREAST TISSUE (Right: Breast) LIPOSUCTION OF RIGHT LATERAL BREAST (Right: Breast)  Patient Location: PACU  Anesthesia Type:General  Level of Consciousness: awake, alert , oriented, drowsy, and patient cooperative  Airway & Oxygen Therapy: Patient Spontanous Breathing and Patient connected to face mask oxygen  Post-op Assessment: Report given to RN and Post -op Vital signs reviewed and stable  Post vital signs: Reviewed and stable  Last Vitals:  Vitals Value Taken Time  BP    Temp    Pulse 59 10/07/22 1114  Resp 9 10/07/22 1114  SpO2 99 % 10/07/22 1114  Vitals shown include unfiled device data.  Last Pain:  Vitals:   10/07/22 0856  TempSrc: Temporal  PainSc: 0-No pain      Patients Stated Pain Goal: 3 (10/07/22 0856)  Complications: No notable events documented.

## 2022-10-07 NOTE — Anesthesia Procedure Notes (Signed)
Procedure Name: LMA Insertion Date/Time: 10/07/2022 10:12 AM  Performed by: Ronnette Hila, CRNAPre-anesthesia Checklist: Patient identified, Emergency Drugs available, Suction available and Patient being monitored Patient Re-evaluated:Patient Re-evaluated prior to induction Oxygen Delivery Method: Circle System Utilized Preoxygenation: Pre-oxygenation with 100% oxygen Induction Type: IV induction Ventilation: Mask ventilation without difficulty LMA: LMA inserted LMA Size: 4.0 Number of attempts: 1 Airway Equipment and Method: bite block Placement Confirmation: positive ETCO2 Tube secured with: Tape Dental Injury: Teeth and Oropharynx as per pre-operative assessment

## 2022-10-07 NOTE — Discharge Instructions (Addendum)
INSTRUCTIONS FOR AFTER BREAST SURGERY   You will likely have some questions about what to expect following your operation.  The following information will help you and your family understand what to expect when you are discharged from the hospital.  It is important to follow these guidelines to help ensure a smooth recovery and reduce complication.  Postoperative instructions include information on: diet, wound care, medications and physical activity.  AFTER SURGERY Expect to go home after the procedure.  In some cases, you may need to spend one night in the hospital for observation.  DIET Breast surgery does not require a specific diet.  However, the healthier you eat the better your body will heal. It is important to increasing your protein intake.  This means limiting the foods with sugar and carbohydrates.  Focus on vegetables and some meat.  If you have liposuction during your procedure be sure to drink water.  If your urine is bright yellow, then it is concentrated, and you need to drink more water.  As a general rule after surgery, you should have 8 ounces of water every hour while awake.  If you find you are persistently nauseated or unable to take in liquids let us know.  NO TOBACCO USE or EXPOSURE.  This will slow your healing process and lead to a wound.  WOUND CARE Leave the binder on for 3 days . Use fragrance free soap like Dial, Dove or [place].   After 3 days you can remove the binder to shower. Once dry apply binder or sports bra. If you have liposuction you will have a soft and spongy dressing (Lipofoam) that helps prevent creases in your skin.  Remove before you shower and then replace it.  It is also available on [employer]. If you have steri-strips / tape directly attached to your skin leave them in place. It is OK to get these wet.   No baths, pools or hot tubs for four weeks. We close your incision to leave the smallest and best-looking scar. No ointment or creams on your incisions  for four weeks.  No Neosporin (Too many skin reactions).  A few weeks after surgery you can use Mederma and start massaging the scar. We ask you to wear your binder or sports bra for the first 6 weeks around the clock, including while sleeping. This provides added comfort and helps reduce the fluid accumulation at the surgery site. NO Ice or heating pads to the operative site.  You have a very high risk of a BURN before you feel the temperature change.  ACTIVITY No heavy lifting until cleared by the doctor.  This usually means no more than a half-gallon of milk.  It is OK to walk and climb stairs. Moving your legs is very important to decrease your risk of a blood clot.  It will also help keep you from getting deconditioned.  Every 1 to 2 hours get up and walk for 5 minutes. This will help with a quicker recovery back to normal.  Let pain be your guide so you don't do too much.  This time is for you to recover.  You will be more comfortable if you sleep and rest with your head elevated either with a few pillows under you or in a recliner.  No stomach sleeping for a three months.  WORK Everyone returns to work at different times. As a rough guide, most people take at least 1 - 2 weeks off prior to returning to work. If  you need documentation for your job, give the forms to the front staff at the clinic.  DRIVING Arrange for someone to bring you home from the hospital after your surgery.  You may be able to drive a few days after surgery but not while taking any narcotics or valium.  BOWEL MOVEMENTS Constipation can occur after anesthesia and while taking pain medication.  It is important to stay ahead for your comfort.  We recommend taking Milk of Magnesia (2 tablespoons; twice a day) while taking the pain pills.  MEDICATIONS You may be prescribed should start after surgery At your preoperative visit for you history and physical you may have been given the following medications: An antibiotic: Start  this medication when you get home and take according to the instructions on the bottle. Zofran 4 mg:  This is to treat nausea and vomiting.  You can take this every 6 hours as needed and only if needed. Valium 2 mg for breast cancer patients: This is for muscle tightness if you have an implant or expander. This will help relax your muscle which also helps with pain control.  This can be taken every 12 hours as needed. Don't drive after taking this medication. Norco (hydrocodone/acetaminophen) 5/325 mg:  This is only to be used after you have taken the Motrin or the Tylenol. Every 8 hours as needed.   Over the counter Medication to take: Ibuprofen (Motrin) 600 mg:  Take this every 6 hours.  If you have additional pain then take 500 mg of the Tylenol every 8 hours.  Only take the Norco after you have tried these two. MiraLAX or Milk of Magnesia: Take this according to the bottle if you take the Norco.  WHEN TO CALL Call your surgeon's office if any of the following occur: Fever 101 degrees F or greater Excessive bleeding or fluid from the incision site. Pain that increases over time without aid from the medications Redness, warmth, or pus draining from incision sites Persistent nausea or inability to take in liquids Severe misshapen area that underwent the operation.  Post Anesthesia Home Care Instructions  Activity: Get plenty of rest for the remainder of the day. A responsible individual must stay with you for 24 hours following the procedure.  For the next 24 hours, DO NOT: -Drive a car -Advertising copywriter -Drink alcoholic beverages -Take any medication unless instructed by your physician -Make any legal decisions or sign important papers.  Meals: Start with liquid foods such as gelatin or soup. Progress to regular foods as tolerated. Avoid greasy, spicy, heavy foods. If nausea and/or vomiting occur, drink only clear liquids until the nausea and/or vomiting subsides. Call your  physician if vomiting continues.  Special Instructions/Symptoms: Your throat may feel dry or sore from the anesthesia or the breathing tube placed in your throat during surgery. If this causes discomfort, gargle with warm salt water. The discomfort should disappear within 24 hours.  If you had a scopolamine patch placed behind your ear for the management of post- operative nausea and/or vomiting:  1. The medication in the patch is effective for 72 hours, after which it should be removed.  Wrap patch in a tissue and discard in the trash. Wash hands thoroughly with soap and water. 2. You may remove the patch earlier than 72 hours if you experience unpleasant side effects which may include dry mouth, dizziness or visual disturbances. 3. Avoid touching the patch. Wash your hands with soap and water after contact with the patch.  May have Tylenol at 3:00pm if needed

## 2022-10-07 NOTE — Interval H&P Note (Signed)
History and Physical Interval Note:  10/07/2022 9:32 AM  Gabrielle Davidson  has presented today for surgery, with the diagnosis of Invasive carcinoma of breast.  The various methods of treatment have been discussed with the patient and family. After consideration of risks, benefits and other options for treatment, the patient has consented to  Procedure(s): left breast reduction for symmetry (Left) EXCISION OF RIGHT LATERAL BREAST TISSUE (Right) POSSIBLE LIPOSUCTION OF LATERAL BREAST (Right) as a surgical intervention.  The patient's history has been reviewed, patient examined, no change in status, stable for surgery.  I have reviewed the patient's chart and labs.  Questions were answered to the patient's satisfaction.     Alena Bills Holley Wirt

## 2022-10-07 NOTE — Op Note (Signed)
Op note:    DATE OF PROCEDURE: 10/07/2022  LOCATION: [facility]  SURGEON: Foster Simpson, DO  ASSISTANT: Evelena Leyden, PA  PREOPERATIVE DIAGNOSIS 1. Breast asymmetry after treatment for breast cancer  POSTOPERATIVE DIAGNOSIS Same as preoperative diagnosis  PROCEDURES 1.  Left breast reduction / mastopexy.  Excess breast tissue and 400 cc removed. 2.  Right breast excision of excess breast tissue medial 2 x 2 cm and lateral 3 x 6 cm area, 100 cc of adipose and soft tissue.   COMPLICATIONS: None.  DRAINS: none  INDICATIONS FOR PROCEDURE Gabrielle Davidson is a 72 y.o. year-old female born on Apr 18, 1950,with a history of symptomatic macromastia with concominant back pain, neck pain, shoulder grooving from her bra.   MRN: [id number]  CONSENT Informed consent was obtained directly from the patient. The risks, benefits and alternatives were fully discussed. Specific risks including but not limited to bleeding, infection, hematoma, seroma, scarring, pain, nipple necrosis, asymmetry, poor cosmetic results, and need for further surgery were discussed. The patient's questions were answered.  DESCRIPTION OF PROCEDURE  Patient was brought into the operating room and rested on the operating room table in the supine position.  SCDs were placed and appropriate padding was performed.  Antibiotics were given. The patient underwent general anesthesia and the chest was prepped and draped in a sterile fashion.  A timeout was performed and all information was confirmed to be correct by those in the room.  Right side: Preoperative markings were confirmed.  The excess breast tissue was noted laterally.  Incision lines were injected with local containing epinephrine.  A #15 blade was used to make an incision at the lateral and medial aspect of the breast.  Tumescent was infused.  The cannula was used to remove 100 cc of excess soft tissue and fat.  The skin was closed with 4-0  Monocryl sutures.  The nipple and skin flaps had good capillary refill at the end of the procedure.    Left side: Preoperative markings were confirmed.  Incision lines were injected with local containing epinephrine.  After waiting for vasoconstriction, the marked lines were incised with a #15 blade.  A lollipop type incision was created.  The skin at the Davidson-areola area was de-epithelialized and at the vertical limb.  The bovie was used to release the lateral and medial limb.  The areola was lifted into position and sutured in place with the 3-0 Monocryl. The 4-0 Monocryl was used to close the skin. The vertical limb was closed with the 3-0 PDS deep followed by the 3-0 Monocryl.  The skin was closed with the 4-0 Monocryl.  Extensive liposuction was done to remove the lateral adipose for improved symmetry for a total of 400 cc. The patient was sat upright and size and shape symmetry was confirmed.   Dermabond was applied.  A breast binder and ABDs were placed.  The nipple and skin flaps had good capillary refill at the end of the procedure.  The patient tolerated the procedure well. The patient was allowed to wake from anesthesia and taken to the recovery room in satisfactory condition.  The advanced practice practitioner (APP) assisted throughout the case.  The APP was essential in retraction and counter traction when needed to make the case progress smoothly.  This retraction and assistance made it possible to see the tissue plans for the procedure.  The assistance was needed for blood control, tissue re-approximation and assisted with closure of the incision site.

## 2022-10-08 ENCOUNTER — Ambulatory Visit (INDEPENDENT_AMBULATORY_CARE_PROVIDER_SITE_OTHER): Payer: Medicare HMO | Admitting: Physician Assistant

## 2022-10-08 ENCOUNTER — Encounter (HOSPITAL_BASED_OUTPATIENT_CLINIC_OR_DEPARTMENT_OTHER): Payer: Self-pay | Admitting: Plastic Surgery

## 2022-10-08 ENCOUNTER — Telehealth: Payer: Self-pay | Admitting: Plastic Surgery

## 2022-10-08 DIAGNOSIS — Z9889 Other specified postprocedural states: Secondary | ICD-10-CM

## 2022-10-08 DIAGNOSIS — N6489 Other specified disorders of breast: Secondary | ICD-10-CM

## 2022-10-08 NOTE — Telephone Encounter (Signed)
Pt is calling and wants to know if Dr Ulice Bold can do the visit on 10-19-22 instead of 10-15-22 she will be at work she stated. Its the first visit with Dr Ulice Bold after sx, sx was 10-07-22

## 2022-10-08 NOTE — Progress Notes (Signed)
Patient is a pleasant 72 year old female with PMH of right breast cancer s/p reconstruction who is now s/p excision of excess right-sided breast tissue and left breast reduction/mastopexy for symmetry performed 10/07/2022 by Dr. Ulice Bold who joins via telephone for postoperative day 1 check-in.  The patient was at home and this provider was calling from their office.  A total of 10 minutes was spent speaking with the patient and reviewing chart.    Today, patient is doing okay.  She states that her left hand is sore from the IV infiltration injury and she also has some left breast soreness, but overall pain is controlled.  She is at home, accompanied by her husband who has been assisting with the postoperative recovery.  She denies any chest pain, difficulty breathing, leg swelling, or other symptoms.  She is ambulatory, voiding.  Tolerating p.o. intake without difficulty.  Recommend continued activity modifications and compressive garments.  Follow-up scheduled for next Wednesday given that she would not be able to make her originally scheduled visit.  She will call the clinic should she have any questions or concerns in the interim.

## 2022-10-09 NOTE — Anesthesia Postprocedure Evaluation (Signed)
Anesthesia Post Note  Patient: Gabrielle Davidson  Procedure(s) Performed: Left breast reduction for symmetry (Left: Breast) EXCISION OF RIGHT LATERAL BREAST TISSUE (Right: Breast) LIPOSUCTION OF RIGHT LATERAL BREAST (Right: Breast)     Patient location during evaluation: PACU Anesthesia Type: General Level of consciousness: awake and alert and oriented Pain management: pain level controlled Vital Signs Assessment: post-procedure vital signs reviewed and stable Respiratory status: spontaneous breathing, nonlabored ventilation and respiratory function stable Cardiovascular status: blood pressure returned to baseline and stable Postop Assessment: no apparent nausea or vomiting Anesthetic complications: no   No notable events documented.  Last Vitals:  Vitals:   10/07/22 1145 10/07/22 1205  BP: (!) 97/42 (!) 118/95  Pulse: 61 60  Resp: 11 16  Temp:  (!) 36.2 C  SpO2: 95% 99%    Last Pain:  Vitals:   10/07/22 1205  TempSrc: Temporal                 Levaughn Puccinelli A.

## 2022-10-12 ENCOUNTER — Ambulatory Visit (INDEPENDENT_AMBULATORY_CARE_PROVIDER_SITE_OTHER): Payer: Medicare HMO | Admitting: Physician Assistant

## 2022-10-12 VITALS — BP 149/78 | HR 74

## 2022-10-12 DIAGNOSIS — N6489 Other specified disorders of breast: Secondary | ICD-10-CM

## 2022-10-12 NOTE — Progress Notes (Signed)
Patient is a pleasant 72 year old female with PMH of right breast cancer s/p reconstruction who is now s/p excision of excess right-sided breast tissue and left breast reduction/mastopexy for symmetry performed 10/07/2022 by Dr. Ulice Bold who presents to clinic for postoperative follow-up.  Reviewed operative report and 400 cc was obtained via liposuction from left lateral breast at time of mastopexy.  Today, patient is doing okay.  She states that her left breast has been profoundly itchy and she has noticed a red rash.  She is unclear what is causing her breakout, but she cannot stop herself from scratching at her left breast.  She denies any discomfort.  She has been wearing her compressive garments.  Denies any leg swelling, chest pain, difficulty breathing, fevers, or other symptoms.  On exam, left NAC appears healthy and viable.  Erythematous rash most consistent with a contact dermatitis.  Steri-Strips and residual Dermabond noted on exam.  Steri-Strips removed carefully without complication or difficulty, no wounds or dehiscence noted.  There is a bit of a small fluid wave appreciated on left breast and suspected there could be a small seroma.  Recommended aspiration attempt, but patient declines.  Instead, we will focus on continued compressive garments.  She denies any left breast pain and only is endorsing considerable pruritus.  Recommending twice daily antihistamines.  She can use a topical hydrocortisone on the area is not immediately adjacent to the incisions.  Peri-incisional areas can use a topical Benadryl ointment instead.  Return in 2 weeks, sooner if needed.  Continued activity modifications in the interim.  She will call the office if she has worsening left breast swelling and is agreeable to aspiration attempt.  Picture(s) obtained of the patient and placed in the chart were with the patient's or guardian's permission.

## 2022-10-13 ENCOUNTER — Ambulatory Visit (INDEPENDENT_AMBULATORY_CARE_PROVIDER_SITE_OTHER): Payer: Medicare HMO | Admitting: Surgical

## 2022-10-13 ENCOUNTER — Encounter: Payer: Medicare HMO | Admitting: Physician Assistant

## 2022-10-13 DIAGNOSIS — Z9889 Other specified postprocedural states: Secondary | ICD-10-CM

## 2022-10-13 DIAGNOSIS — N6489 Other specified disorders of breast: Secondary | ICD-10-CM

## 2022-10-13 DIAGNOSIS — C50919 Malignant neoplasm of unspecified site of unspecified female breast: Secondary | ICD-10-CM

## 2022-10-13 NOTE — Progress Notes (Addendum)
Patient is a 72 year old female who underwent repeat left breast reduction for symmetry on 10/07/2022 with Dr. Ulice Bold.  She is 6 days postop.  She was seen in the office yesterday, noted to have significant irritation to the left breast and a rash.  Patient reports that today she noticed the incision along the middle of her breast has separated.  She presents today with the filling/gauze from a depends adult diaper taped over the opening of the wound.  She reports she is otherwise feeling well.  She is not having any infectious symptoms.  She reports that her breast is still very itchy.  Chaperone present on exam On exam right breast with small incision and Steri-Strip noted, medial breast ecchymosis is noted.  Left breast with significant irritation surrounding the areola and vertical limb.  Rashes noted.  There is complete incisional dehiscence noted from the base of the NAC to the distal portion of the vertical limb with exposed breast fat noted.  Monocryl sutures noted throughout the wound bed.  There is no surrounding cellulitic changes.  No active drainage noted.  She does have subcutaneous fluid collection noted with palpation.  The left breast wound is 5.5 x 1.5 x 1 cm and full-thickness.  On exam there is cotton and granules from a depends adult diaper within the wound bed.  There is no purulence or foul odor is noted.  A/P:  The left breast wound was irrigated using sterile saline and Vashe.  Patient tolerated this well.  All of the depends granules and cotton was removed.  Discussed with patient that the wound will need to heal via secondary intention.  We discussed use of Vashe daily, use of Vaseline and gauze wound care daily.  All the patient's questions were answered to her content.  Pictures were taken and placed in the patient's chart with patient's permission.  I do not see any signs of active infection.  She does have a subcutaneous fluid collection, however given the open  wound and significant irritation, opted to not aspirate at this time.  I did discuss with the patient that she may notice sudden drainage of fluid from the open wound of her left breast.  Offered patient appointment for next week, she was unable to schedule anything for next week due to work obligations.  She stated that she would be available for follow-up in 2 weeks.  Discussed with patient we would order wound care supplies for her via [facility].

## 2022-10-15 ENCOUNTER — Encounter: Payer: Medicare HMO | Admitting: Plastic Surgery

## 2022-10-20 ENCOUNTER — Telehealth: Payer: Self-pay

## 2022-10-20 NOTE — Telephone Encounter (Signed)
Faxed wound care supply req, demo, insurance info, and OV note to [facility] with confirmed receipt.

## 2022-10-20 NOTE — Telephone Encounter (Signed)
Received fax from [facility]: [facility] has provided service for the patient; no further action is required.  Will send to scan

## 2022-10-26 ENCOUNTER — Encounter: Payer: Self-pay | Admitting: Physician Assistant

## 2022-10-26 ENCOUNTER — Ambulatory Visit: Payer: Medicare HMO | Admitting: Physician Assistant

## 2022-10-26 VITALS — BP 108/59 | HR 60 | Ht 72.0 in | Wt 139.2 lb

## 2022-10-26 DIAGNOSIS — N6489 Other specified disorders of breast: Secondary | ICD-10-CM

## 2022-10-26 NOTE — Progress Notes (Signed)
Patient is a pleasant 72 year old female with PMH of right breast cancer s/p reconstruction who is now s/p excision of excess right-sided breast tissue and left breast reduction/mastopexy for symmetry performed 10/07/2022 by Dr. Ulice Bold who presents to clinic for postoperative follow-up.   She was last seen here in clinic on 10/13/2022.  At that time, a large, 5.5 x 1.5 x 1 cm full-thickness wound noted over left vertical limb incision.  She had previously been seen with a significant left breast erythematous rash reflective of tape dermatitis at which time a small ipsilateral seroma was appreciated on exam.  After the dehiscence, plan was for daily Vashe and careful use of Vaseline gauze.  Today, patient is doing well.  Her husband has been applying Xeroform followed by a bandage with which she secures with a soft tape.  She has not had any issues.  She feels as though the left side is slightly larger than the right, but the swelling has improved.  She has residual tape dermatitis, but improved compared to how it was 2 weeks ago.  Patient's only concern is the smell of Xeroform.  She continues to work as a Teacher, early years/pre.  Denies any leg swelling, fevers, chest pain or difficulty breathing, or other symptoms.  On exam, wound now only measures 4.25 x 1.25 cm, 0.2 cm depth.  Excellent granulation.  Surrounding skin and tissue appears healthy.  No evidence concerning for infection.  Nondraining.  No ballotable fluid collections.  Residual evidence of dermatitis.  Recommend continued wound care.  Asked that she try to avoid any tape products given her dermatitis.  Follow-up in 2 weeks.  Suspect by then she will have almost completely healed.  Recommend continued activity modifications and compressive garments in the interim.  She will call the clinic should she have any questions or concerns.  Picture(s) obtained of the patient and placed in the chart were with the patient's or guardian's permission.

## 2022-11-09 ENCOUNTER — Encounter: Payer: Medicare HMO | Admitting: Physician Assistant

## 2022-11-09 ENCOUNTER — Encounter: Payer: Self-pay | Admitting: Physician Assistant

## 2022-11-09 ENCOUNTER — Ambulatory Visit: Payer: Medicare HMO | Admitting: Physician Assistant

## 2022-11-09 VITALS — BP 103/66 | HR 78

## 2022-11-09 DIAGNOSIS — N6489 Other specified disorders of breast: Secondary | ICD-10-CM

## 2022-11-09 NOTE — Progress Notes (Signed)
Patient is a pleasant 72 year old female with PMH of right breast cancer s/p reconstruction who is now s/p excision of excess right-sided breast tissue and left breast reduction/mastopexy for symmetry performed 10/07/2022 by Dr. Ulice Bold who presents to clinic for postoperative follow-up.   She was last seen here in clinic on 10/26/2022.  At that time, her left vertical limb wound measured 4.25 x 1.25 cm, 0.2 cm depth.  Excellent granulation.  Surrounding tissue appeared healthy.  Recommended continued wound care with Xeroform.  Avoid any tape products given her significant dermatitis earlier in postoperative course.  Today, patient is doing well.  She does not believe that her wound has healed is much as she was anticipating since last encounter.  However, she is doing well otherwise.  Denies any leg swelling, chest pain, difficulty breathing, fevers, or other concerns.  She states that her postoperative symmetry is significantly improved between her breasts.  She is simply hesitant to reach or do anything else at work that may compromise her healing wound and is requesting an additional letter for work purposes.  She also wants to know if there is anything else that can help her wound heal faster.  On exam, wound now measuring 4 x 1 cm, no appreciable depth.  In fact, it appears as though there has been some epithelialization over the vast majority of the wound and she only has a residual less than 0.5 cm superficial wound on the superior aspect of her dehiscence injury.  No drainage.  Surrounding tissue is soft, nontender.  No erythema or other overlying skin changes.  Breast soft throughout.  Improved symmetry between breasts.  No persistent swelling or obvious seroma noted.  Placed a donated Hydrofera Blue collagen bandage over her residual 4 x 1 cm wound, but informed patient that she may have some persistent hyperpigmented scar over her vertical limb.  Can discuss silicone scar gels in the future,  suspect that it will be fully epithelialized in 2 weeks at next encounter.  If 6 months down the road she is displeased with the scarring, could consider scar revision and discuss with Dr. Ulice Bold.  Picture(s) obtained of the patient and placed in the chart were with the patient's or guardian's permission.

## 2022-11-24 ENCOUNTER — Ambulatory Visit (INDEPENDENT_AMBULATORY_CARE_PROVIDER_SITE_OTHER): Payer: Medicare HMO | Admitting: Physician Assistant

## 2022-11-24 ENCOUNTER — Encounter: Payer: Self-pay | Admitting: Physician Assistant

## 2022-11-24 VITALS — BP 117/22 | HR 86 | Ht <= 58 in | Wt 138.0 lb

## 2022-11-24 DIAGNOSIS — N6489 Other specified disorders of breast: Secondary | ICD-10-CM

## 2022-11-24 NOTE — Progress Notes (Signed)
Patient is a pleasant 72 year old female with PMH of right breast cancer s/p reconstruction who is now s/p excision of excess right-sided breast tissue and left breast reduction/mastopexy for symmetry performed 10/07/2022 by Dr. Ulice Bold who presents to clinic for postoperative follow-up.   She was last seen here in clinic on 11/09/2022.  At that time, wound measured 4 x 1 cm, no appreciable depth.  It appeared largely healed throughout.  No drainage.  Placed donated Hydrofera Blue collagen bandage over the residual wound, but informed patient that she may have persistent hyperpigmented scar over the vertical limb.  Can discuss silicone scar gels in the future and possible scar revision down the road.  Today, patient is doing okay.  She reports that she still has a small wound over the area of her vertical scar that drains minimally and is bothersome.  She has been doing Vaseline without complete resolution and is interested in additional wound care products.  On exam, her vertical limb scar remains the same at approximately 4 x 1 cm hyperpigmentation.  Good skin and wound healing throughout majority.  However, there is a 0.75 x 0.75 superficial wound centrally with minimal serous drainage noted.  No surrounding erythema or induration.  Area is nontender.  No obvious infection.  No obvious underlying subcutaneous fluid collection or masses noted.  Remainder of exam is benign.  Offered Medihoney patches, but patient would like to first try collagen which is not unreasonable.  Will prescribe through [facility] which she has already received products from in the past.  No antibiotics warranted.  Follow-up in 3 weeks, sooner if needed.  Picture(s) obtained of the patient and placed in the chart were with the patient's or guardian's permission.

## 2022-11-25 ENCOUNTER — Telehealth: Payer: Self-pay | Admitting: *Deleted

## 2022-11-25 NOTE — Telephone Encounter (Signed)
Faxed order,demographics,insurance inform,and recent office notes to [facility] for supplies for the patient.    Supplies:Collagen Dressing:Daily  Copy scanned into the chart.//AB/CMA

## 2022-12-02 ENCOUNTER — Telehealth: Payer: Self-pay | Admitting: *Deleted

## 2022-12-02 NOTE — Telephone Encounter (Signed)
Received on (11/26/2022) via of fax Order Status Notification from [facility].  Stating:[facility] has provided service for the patient;no further action is required.        Copy scanned into the chart.//AB/CMA

## 2022-12-14 ENCOUNTER — Encounter: Payer: Medicare HMO | Admitting: Surgical

## 2022-12-14 ENCOUNTER — Encounter: Payer: Medicare HMO | Admitting: Physician Assistant

## 2022-12-20 ENCOUNTER — Ambulatory Visit (INDEPENDENT_AMBULATORY_CARE_PROVIDER_SITE_OTHER): Payer: Medicare HMO | Admitting: Physician Assistant

## 2022-12-20 VITALS — BP 116/77 | HR 82

## 2022-12-20 DIAGNOSIS — N6489 Other specified disorders of breast: Secondary | ICD-10-CM

## 2022-12-20 NOTE — Progress Notes (Signed)
Patient is a pleasant 72 year old female with PMH of right breast cancer s/p reconstruction who is now s/p excision of excess right-sided breast tissue and left breast reduction/mastopexy for symmetry performed 10/07/2022 by Dr. Ulice Bold who presents to clinic for postoperative follow-up.  Patient was last seen here in clinic on 11/24/2022.  At that time, she had been applying Vaseline to a small, 0.75 x 0.75 superficial wound centrally on her area of hyperpigmentation/scarring.  Exam was otherwise benign.  Discussed Medihoney patches versus collagen and patient expressed that she like to proceed with collagen.  Follow-up in 3 weeks, sooner if needed.  Today, she tells me that her vertical limb scar is "ugly".  She also states that she has a persistent small abrasion in the middle of her otherwise healed wound.  She has continued to apply collagen regularly.  Patient is not concerned for infection.   On exam, 0.3 x 0.3 cm superficial abrasion overlying the vertical limb scar.  No palpable underlying fluid collections.  No surrounding erythema or other overlying skin changes.  No induration.  No malodor or drainage.  No obvious infection.  Breasts are otherwise well-healed.  Recommend that patient apply a thin amount of Vaseline followed by bordered Mepilex.  Suspect that the abrasion could be result of shearing/friction from her bra.  Informed patient that the new epithelium is thin and prone to superficial tears.  As for her vertical limb scar that is bothersome to her statically, discussed transition to silicone scar gels twice daily x 3 months after her abrasion fully heals.  Also discussed having patient return when she is 6 months postop from her reduction for consideration of scar revision with Dr. Ulice Bold.  Patient voices understanding and is agreeable to the plan.  She will call the office should she have any new or worsening symptoms or if the superficial abrasion fails to improve.  Picture(s)  obtained of the patient and placed in the chart were with the patient's or guardian's permission.

## 2023-01-13 ENCOUNTER — Ambulatory Visit
Admission: RE | Admit: 2023-01-13 | Discharge: 2023-01-13 | Disposition: A | Discharge status: Home / Self Care | Payer: Medicare HMO | Source: Ambulatory Visit | Attending: Internal Medicine | Admitting: Internal Medicine

## 2023-01-13 DIAGNOSIS — Z1231 Encounter for screening mammogram for malignant neoplasm of breast: Secondary | ICD-10-CM | POA: Diagnosis present

## 2023-01-18 ENCOUNTER — Other Ambulatory Visit: Payer: Self-pay | Admitting: Internal Medicine

## 2023-01-18 DIAGNOSIS — R928 Other abnormal and inconclusive findings on diagnostic imaging of breast: Secondary | ICD-10-CM

## 2023-01-18 DIAGNOSIS — R921 Mammographic calcification found on diagnostic imaging of breast: Secondary | ICD-10-CM

## 2023-01-19 ENCOUNTER — Ambulatory Visit
Admission: RE | Admit: 2023-01-19 | Discharge: 2023-01-19 | Disposition: A | Discharge status: Home / Self Care | Payer: Medicare HMO | Source: Ambulatory Visit | Attending: Internal Medicine | Admitting: Internal Medicine

## 2023-01-19 DIAGNOSIS — R928 Other abnormal and inconclusive findings on diagnostic imaging of breast: Secondary | ICD-10-CM | POA: Diagnosis present

## 2023-01-19 DIAGNOSIS — R921 Mammographic calcification found on diagnostic imaging of breast: Secondary | ICD-10-CM | POA: Insufficient documentation

## 2023-01-20 ENCOUNTER — Other Ambulatory Visit: Payer: Self-pay | Admitting: Internal Medicine

## 2023-01-20 DIAGNOSIS — R928 Other abnormal and inconclusive findings on diagnostic imaging of breast: Secondary | ICD-10-CM

## 2023-01-20 DIAGNOSIS — R921 Mammographic calcification found on diagnostic imaging of breast: Secondary | ICD-10-CM

## 2023-01-21 ENCOUNTER — Encounter: Payer: Self-pay | Admitting: Internal Medicine

## 2023-01-21 DIAGNOSIS — R928 Other abnormal and inconclusive findings on diagnostic imaging of breast: Secondary | ICD-10-CM

## 2023-01-21 DIAGNOSIS — R921 Mammographic calcification found on diagnostic imaging of breast: Secondary | ICD-10-CM

## 2023-01-31 ENCOUNTER — Ambulatory Visit
Admission: RE | Admit: 2023-01-31 | Discharge: 2023-01-31 | Disposition: A | Discharge status: Home / Self Care | Payer: Medicare HMO | Source: Ambulatory Visit | Attending: Internal Medicine | Admitting: Internal Medicine

## 2023-01-31 DIAGNOSIS — R928 Other abnormal and inconclusive findings on diagnostic imaging of breast: Secondary | ICD-10-CM | POA: Insufficient documentation

## 2023-01-31 DIAGNOSIS — R921 Mammographic calcification found on diagnostic imaging of breast: Secondary | ICD-10-CM | POA: Insufficient documentation

## 2023-01-31 DIAGNOSIS — N6012 Diffuse cystic mastopathy of left breast: Secondary | ICD-10-CM | POA: Insufficient documentation

## 2023-01-31 HISTORY — PX: BREAST BIOPSY: SHX20

## 2023-01-31 MED ORDER — LIDOCAINE-EPINEPHRINE 1 %-1:100000 IJ SOLN
20.0000 mL | Freq: Once | INTRAMUSCULAR | Status: AC
  Administered 2023-01-31: 20 mL
  Filled 2023-01-31: qty 20

## 2023-01-31 MED ORDER — LIDOCAINE HCL 1 % IJ SOLN
5.0000 mL | Freq: Once | INTRAMUSCULAR | Status: AC
  Administered 2023-01-31: 5 mL
  Filled 2023-01-31: qty 5

## 2023-02-01 LAB — SURGICAL PATHOLOGY

## 2023-03-18 ENCOUNTER — Encounter: Payer: Self-pay | Admitting: Plastic Surgery

## 2023-03-18 ENCOUNTER — Ambulatory Visit: Payer: Medicare HMO | Admitting: Plastic Surgery

## 2023-03-18 NOTE — Telephone Encounter (Signed)
 Please see message.

## 2023-04-05 ENCOUNTER — Telehealth: Payer: Self-pay | Admitting: Plastic Surgery

## 2023-04-05 NOTE — Telephone Encounter (Signed)
Called and left a VM about Dr. Algis Downs being out of office

## 2023-04-08 ENCOUNTER — Ambulatory Visit (INDEPENDENT_AMBULATORY_CARE_PROVIDER_SITE_OTHER): Payer: Medicare HMO | Admitting: Plastic Surgery

## 2023-04-08 ENCOUNTER — Encounter: Payer: Self-pay | Admitting: Plastic Surgery

## 2023-04-08 VITALS — BP 130/76 | HR 72 | Ht <= 58 in | Wt 142.2 lb

## 2023-04-08 DIAGNOSIS — C50211 Malignant neoplasm of upper-inner quadrant of right female breast: Secondary | ICD-10-CM

## 2023-04-08 DIAGNOSIS — N651 Disproportion of reconstructed breast: Secondary | ICD-10-CM | POA: Diagnosis not present

## 2023-04-08 DIAGNOSIS — C50919 Malignant neoplasm of unspecified site of unspecified female breast: Secondary | ICD-10-CM

## 2023-04-08 NOTE — Progress Notes (Signed)
   Subjective:    Patient ID: Gabrielle Davidson, female    DOB: 06-Apr-1950, 73 y.o.   MRN: [id number]  The patient is a 73 year old female here for further evaluation of her breast.  She had a diagnosis of breast cancer in 2022 in the right upper inner quadrant.  She is 4 feet 11 inches tall and weight of 144 pounds at the time.  Her previous surgery had been liposuction of her breast 20 years previous to that visit.  She underwent a right immediate breast reconstruction with placement of acellular dermal matrix and a tissue expander on January 2023.  Then in April 2023 she had removal of the expander and placement of a right Mentor smooth round ultra high-profile gel 535 cc implant with a left breast reduction for improved symmetry.  She then had some lipo filling of the right breast in November 2023 for total of 300 cc for improved symmetry.  Then in July 2024 she had left breast mastopexy reduction with excision of right excess breast tissue for improved symmetry.  Her symmetry is markedly improved but she still has some asymmetry with the left breast larger than the right.  She also has some irregularities in the contour of the right breast that she would like to see improved upon.     Review of Systems  Constitutional: Negative.   HENT: Negative.    Eyes: Negative.   Respiratory: Negative.    Cardiovascular: Negative.   Gastrointestinal: Negative.   Endocrine: Negative.   Genitourinary: Negative.   Musculoskeletal: Negative.        Objective:   Physical Exam Vitals reviewed.  Constitutional:      Appearance: Normal appearance.  HENT:     Head: Atraumatic.  Cardiovascular:     Rate and Rhythm: Normal rate.     Pulses: Normal pulses.  Pulmonary:     Effort: Pulmonary effort is normal.  Skin:    General: Skin is warm.     Capillary Refill: Capillary refill takes less than 2 seconds.  Neurological:     Mental Status: She is alert and oriented to person, place, and time.   Psychiatric:        Mood and Affect: Mood normal.        Behavior: Behavior normal.        Thought Content: Thought content normal.       Assessment & Plan:     ICD-10-CM   1. Breast asymmetry following reconstructive surgery  N65.1     2. Invasive carcinoma of breast (HCC)  C50.919        Plan for right breast fat grafting with excision of excess breast tissue medially and reduction of left breast for improved symmetry.  Pictures were obtained of the patient and placed in the chart with the patient's or guardian's permission.

## 2023-04-25 ENCOUNTER — Encounter: Payer: Self-pay | Admitting: Ophthalmology

## 2023-04-27 ENCOUNTER — Ambulatory Visit (INDEPENDENT_AMBULATORY_CARE_PROVIDER_SITE_OTHER): Payer: Medicare HMO | Admitting: Student

## 2023-04-27 VITALS — BP 130/66 | HR 75

## 2023-04-27 DIAGNOSIS — Z9889 Other specified postprocedural states: Secondary | ICD-10-CM

## 2023-04-27 MED ORDER — CEPHALEXIN 500 MG PO CAPS: 500.0000 mg | ORAL_CAPSULE | Freq: Four times a day (QID) | ORAL | 0 refills | Status: AC

## 2023-04-27 MED ORDER — OXYCODONE HCL 5 MG PO TABS: 5.0000 mg | ORAL_TABLET | Freq: Four times a day (QID) | ORAL | 0 refills | Status: DC | PRN

## 2023-04-27 NOTE — H&P (View-Only) (Signed)
 Patient ID: Gabrielle Davidson, female    DOB: 1951-02-16, 73 y.o.   MRN: [id number]  Chief Complaint  Patient presents with   Pre-op Exam      ICD-10-CM   1. S/P breast reconstruction  Z98.890        History of Present Illness: Gabrielle Davidson is a 73 y.o.  female  with a history of breast cancer.  She presents for preoperative evaluation for upcoming procedure, excision of excess right breast tissue with fat grafting and left breast reduction for symmetry, scheduled for 05/19/2023 with Dr. Ulice Bold.  The patient has not had problems with anesthesia.  Patient denies any history of cardiac disease.  She denies taking any blood thinners.  Patient reports she is not a smoker.  Patient denies taking any birth control or hormone replacement.  She denies any history of miscarriages.  She denies any personal family history of blood clots or clotting diseases.  Patient denies any recent traumas, surgeries or infections.  She does state that she is undergoing cataract surgery next week though.  She denies any history of stroke or heart attack.  She denies any history of Crohn's disease, ulcerative colitis, COPD or asthma.  She denies any varicosities to her lower extremities.  She denies any fevers or chills.  She did state that she has a little bit of redness to her right breast that she noticed today.  She states that it is not painful.  She reports it is not swollen.  She denies any fevers or chills.  She reports that throughout the day it has been fading.  Summary of Previous Visit: Patient was most recently seen in the clinic on 04/08/2023.  At this visit, it was noted that patient had a diagnosis of breast cancer in 2022 in the right breast.  Patient underwent a right immediate breast reconstruction with placement of acellular dermal matrix and tissue expander in January 2023.  In April 2023, she had the expander removed and had placement of right Mentor smooth round ultrahigh profile gel  that 35 cc implant with a left breast reduction for improved symmetry.  Patient later underwent lipo filling of the right breast in November 2023 for a total of 300 cc for improved symmetry.  Then in July 2024, patient had a left breast mastopexy reduction with excision of right excess breast tissue for improved symmetry.    At the visit on 04/08/2023, the symmetry was markedly improved but she still had some asymmetry with the left breast being larger than the right.  There were also some irregularities in the contour of the right breast that patient wanted improved.  Plan was to move forward with right breast fat grafting with excision of excess breast tissue medially and left breast reduction for improved symmetry.  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   PMH Significant for: Hypertension, thyroid cancer, breast cancer  Patient does reports she had an allergic reaction to dressings last time.  She is unable to state which dressing she had a reaction to.  Per chart review, it does appear she had a contact dermatitis shortly after surgery.   Past Medical History: Allergies: No Known Allergies  Current Medications:  Current Outpatient Medications:    amitriptyline (ELAVIL) 25 MG tablet, Take 25 mg by mouth at bedtime., Disp: , Rfl:    Ascorbic Acid (VITAMIN C) 1000 MG tablet, Take 1,000 mg by mouth in the  morning and at bedtime., Disp: , Rfl:    Biotin 16109 MCG TABS, Take 10,000 mcg by mouth in the morning and at bedtime., Disp: , Rfl:    Calcium Carbonate-Vitamin D3 600-400 MG-UNIT TABS, Take 1-2 tablets by mouth See admin instructions. 1 tablet in the morning and 2 tablets at bedtime, Disp: , Rfl:    cephALEXin (KEFLEX) 500 MG capsule, Take 1 capsule (500 mg total) by mouth 4 (four) times daily for 3 days., Disp: 12 capsule, Rfl: 0   chlorthalidone (HYGROTON) 25 MG tablet, Take 25 mg by mouth in the morning., Disp: , Rfl:    FLUoxetine  (PROZAC) 40 MG capsule, Take 40 mg by mouth every morning. , Disp: , Rfl:    fluticasone (FLONASE) 50 MCG/ACT nasal spray, Place 2 sprays into both nostrils daily., Disp: , Rfl:    gabapentin (NEURONTIN) 300 MG capsule, Take 600 mg by mouth 2 (two) times daily., Disp: , Rfl:    hydrOXYzine (ATARAX) 10 MG tablet, Take 10 mg by mouth 3 (three) times daily as needed for itching., Disp: , Rfl:    ibuprofen (ADVIL) 200 MG tablet, Take 400-800 mg by mouth every 8 (eight) hours as needed (PAIN.)., Disp: , Rfl:    levothyroxine (SYNTHROID) 100 MCG tablet, Take by mouth. Take 1 tablet 6 days per week. Do not take on Sundays. Take on an empty stomach with a glass of water at least 30-60 minutes before breakfast., Disp: , Rfl:    losartan (COZAAR) 100 MG tablet, Take 100 mg by mouth in the morning., Disp: , Rfl:    meclizine (ANTIVERT) 25 MG tablet, Take 25 mg by mouth 3 (three) times daily as needed for dizziness., Disp: , Rfl:    metFORMIN (GLUCOPHAGE-XR) 500 MG 24 hr tablet, Take 1,000 mg by mouth at bedtime., Disp: , Rfl:    Multiple Vitamin (MULTIVITAMIN WITH MINERALS) TABS tablet, Take 1 tablet by mouth daily., Disp: , Rfl:    ondansetron (ZOFRAN) 4 MG tablet, Take 1 tablet (4 mg total) by mouth every 8 (eight) hours as needed for nausea or vomiting., Disp: 20 tablet, Rfl: 0   ondansetron (ZOFRAN) 4 MG tablet, Take 1 tablet (4 mg total) by mouth every 8 (eight) hours as needed for nausea or vomiting., Disp: 20 tablet, Rfl: 0   oxyCODONE (ROXICODONE) 5 MG immediate release tablet, Take 1 tablet (5 mg total) by mouth every 6 (six) hours as needed for up to 20 doses for severe pain (pain score 7-10)., Disp: 20 tablet, Rfl: 0   tiZANidine (ZANAFLEX) 4 MG tablet, Take 1 tablet (4 mg total) by mouth every 8 (eight) hours as needed for muscle spasms., Disp: 30 tablet, Rfl: 0   vitamin B-12 (CYANOCOBALAMIN) 1000 MCG tablet, Take 1,000 mcg by mouth in the morning., Disp: , Rfl:   Past Medical Problems: Past  Medical History:  Diagnosis Date   Breast cancer (HCC)    Dyspnea    Family history of breast cancer    Family history of colon cancer    Hepatitis    A-YEARS AGO   Hypercholesteremia    Hypertension    Hypothyroidism    Osteopenia of multiple sites    Pneumonia    Statin-induced myositis    Thyroid cancer (HCC)    Wears contact lenses     Past Surgical History: Past Surgical History:  Procedure Laterality Date   BREAST BIOPSY Right 02/10/2021   [place] bx, heart marker,  INVASIVE MAMMARY CARCINOMA   BREAST  BIOPSY Right 02/18/2021   stereo bx, distortion "COIL" clip-BENIGN MAMMARY PARENCHYMA WITH FOCAL PSEUDO   BREAST BIOPSY Left 01/31/2023   path pending/ ribbon clip   BREAST BIOPSY Left 01/31/2023   MM LT BREAST BX W LOC DEV 1ST LESION IMAGE BX SPEC STEREO GUIDE 01/31/2023 [facility]   BREAST CYST EXCISION Right 10/07/2022   Procedure: EXCISION OF RIGHT LATERAL BREAST TISSUE;  Surgeon: Peggye Form, DO;  Location: [facility];  Service: Plastics;  Laterality: Right;   BREAST RECONSTRUCTION WITH PLACEMENT OF TISSUE EXPANDER AND FLEX HD (ACELLULAR HYDRATED DERMIS) Right 04/08/2021   Procedure: RIGHT BREAST RECONSTRUCTION WITH PLACEMENT OF TISSUE EXPANDER AND FLEX HD (ACELLULAR HYDRATED DERMIS);  Surgeon: Peggye Form, DO;  Location: [facility];  Service: Plastics;  Laterality: Right;   BREAST REDUCTION SURGERY Left 06/25/2021   Procedure: MAMMARY REDUCTION  (BREAST) LEFT BREAST;  Surgeon: Peggye Form, DO;  Location: [facility];  Service: Plastics;  Laterality: Left;   COLONOSCOPY W/ POLYPECTOMY     LIPOSUCTION Right 10/07/2022   Procedure: LIPOSUCTION OF RIGHT LATERAL BREAST;  Surgeon: Peggye Form, DO;  Location: [facility];  Service: Plastics;  Laterality: Right;   LIPOSUCTION WITH LIPOFILLING Right 01/14/2022   Procedure: LIPOSUCTION WITH LIPOFILLING;  Surgeon: Peggye Form, DO;  Location:  [facility];  Service: Plastics;  Laterality: Right;   LUMBAR LAMINECTOMY/ DECOMPRESSION WITH MET-RX Right 09/16/2020   Procedure: Right Lumbar five Sacral one Minimally  invasive Discectomy with Metrex;  Surgeon: Jadene Pierini, MD;  Location: [facility];  Service: Neurosurgery;  Laterality: Right;   MASTECTOMY Right    MASTECTOMY WITH AXILLARY LYMPH NODE DISSECTION Right 04/08/2021   Procedure: MASTECTOMY WITH AXILLARY LYMPH NODE DISSECTION;  Surgeon: Earline Mayotte, MD;  Location: [facility];  Service: General;  Laterality: Right;   MASTOPEXY Left 06/25/2021   Procedure: MASTOPEXY LEFT BREAST;  Surgeon: Peggye Form, DO;  Location: [facility];  Service: Plastics;  Laterality: Left;   RECONSTRUCTION OF ANGULAR DEFORMITY,TOE Right 10/15/2021   Procedure: 78295 - RECONSTRUCTION hammertoe OVERLAPPING TOE - SECOND;  Surgeon: Rosetta Posner, DPM;  Location: [facility];  Service: Podiatry;  Laterality: Right;   REDUCTION MAMMAPLASTY     REMOVAL OF BILATERAL TISSUE EXPANDERS WITH PLACEMENT OF BILATERAL BREAST IMPLANTS Right 06/25/2021   Procedure: REMOVAL OF RIGH TISSUE EXPANDERS WITH PLACEMENT OF RIGHT BREAST IMPLANTS;  Surgeon: Peggye Form, DO;  Location: [facility];  Service: Plastics;  Laterality: Right;  1.5 hours   THYROIDECTOMY Bilateral 03/26/2015   Procedure: THYROIDECTOMY;  Surgeon: Geanie Logan, MD;  Location: [facility];  Service: ENT;  Laterality: Bilateral;   UNILATERAL BREAST REDUCTION Left 10/07/2022   Procedure: Left breast reduction for symmetry;  Surgeon: Peggye Form, DO;  [facility];  Service: Plastics;  Laterality: Left;   WEIL OSTEOTOMY Right 10/15/2021   Procedure: 62130 - WEIL OSTEOTOMY - SECOND;  Surgeon: Rosetta Posner, DPM;  Location: [facility];  Service: Podiatry;  Laterality: Right;  Anesthesia: Choice preop pop block   WISDOM TOOTH EXTRACTION      Social History: Social History    Socioeconomic History   Marital status: Married    Spouse name: ,Link Snuffer   Number of children: Not on file   Years of education: Not on file   Highest education level: Not on file  Occupational History   Not on file  Tobacco Use   Smoking status: Former  Current packs/day: 0.00    Average packs/day: 1 pack/day for 20.0 years (20.0 ttl pk-yrs)    Types: Cigarettes    Start date: 03/21/1963    Quit date: 03/21/1983    Years since quitting: 40.1   Smokeless tobacco: Never  Vaping Use   Vaping status: Never Used  Substance and Sexual Activity   Alcohol use: Yes    Comment: social   Drug use: No   Sexual activity: Not Currently    Birth control/protection: Post-menopausal  Other Topics Concern   Not on file  Social History Narrative   Not on file   Social Drivers of Health   Financial Resource Strain: Low Risk  (03/15/2023)   Received from [facility]   Overall Financial Resource Strain (CARDIA)    Difficulty of Paying Living Expenses: Not hard at all  Food Insecurity: No Food Insecurity (03/15/2023)   [facility]   Hunger Vital Sign    Worried About Running Out of Food in the Last Year: Never true    Ran Out of Food in the Last Year: Never true  Transportation Needs: No Transportation Needs (03/15/2023)   Received from [facility] - Transportation    In the past 12 months, has lack of transportation kept you from medical appointments or from getting medications?: No    Lack of Transportation (Non-Medical): No  Physical Activity: Not on file  Stress: Not on file  Social Connections: Not on file  Intimate Partner Violence: Unknown (06/16/2021)   [facility]   HITS    Physically Hurt: Not on file    Insult or Talk Down To: Not on file    Threaten Physical Harm: Not on file    Scream or Curse: Not on file    Family History: Family History  Problem Relation Age  of Onset   Alzheimer's disease Mother    Hernia Mother    Heart disease Father    Colon cancer Maternal Aunt        d. under 63   Breast cancer Paternal Aunt 40   Heart attack Maternal Grandfather    Diabetes Paternal Grandmother    Breast cancer Cousin 14    Review of Systems: Denies any fevers or chills  Physical Exam: Vital Signs BP 130/66 (BP Location: Right Arm, Patient Position: Sitting, Cuff Size: Normal)   Pulse 75   SpO2 96%   Physical Exam  Constitutional:      General: Not in acute distress.    Appearance: Normal appearance. Not ill-appearing.  HENT:     Head: Normocephalic and atraumatic.  Eyes:     Pupils: Pupils are equal, round Neck:     Musculoskeletal: Normal range of motion.  Cardiovascular:     Rate and Rhythm: Normal rate Pulmonary:     Effort: Pulmonary effort is normal. No respiratory distress.  Musculoskeletal: Normal range of motion.  Skin:    General: Skin is warm and dry.     Findings: No erythema or rash.  Neurological:     Mental Status: Alert and oriented to person, place, and time. Mental status is at baseline.  Psychiatric:        Mood and Affect: Mood normal.        Behavior: Behavior normal.    Assessment/Plan: The patient is scheduled for excision of excess right breast tissue with fat grafting and left breast reduction for symmetry with Dr.  Dillingham.  Risks, benefits, and alternatives of procedure discussed, questions answered and consent obtained.    Smoking Status: Non-smoker; Counseling Given?  N/A Last Mammogram: 01/19/2023 suspicious, patient underwent biopsy on 01/31/2023 which was negative for malignancy  Caprini Score: 7; Risk Factors include: Age, BMI > 25, history of thrombosis and length of planned surgery. Recommendation for mechanical and possible pharmacological prophylaxis. Encourage early ambulation.  It appears patient did not have postoperative Lovenox for prior surgeries.  Pictures obtained:  04/08/2023  Post-op Rx sent to pharmacy:  Keflex, oxycodone-patient states that she still has Zofran at home.  Instructed patient to hold her multivitamins, supplements and ibuprofen 1 week prior to surgery.  Instructed her to hold losartan, her diuretic and amitriptyline the day of surgery.  Discussed with the patient to not take her Zanaflex is the same time as oxycodone as this can be sedating.  Patient expressed understanding.  Instructed the patient to continue to monitor her breast.  She believes that it is red from how she slept on it as it has been fading.  Discussed with her that if she has any increased pain, swelling, fevers, chills or if the redness persists, she should be seen in clinic tomorrow Friday.  Patient expressed understanding.  Patient was provided with the Breast Reduction Risk and  General Surgical Risk consent document and Pain Medication Agreement prior to their appointment.  They had adequate time to read through the risk consent documents and Pain Medication Agreement. We also discussed them in person together during this preop appointment. All of their questions were answered to their satisfaction.  Recommended calling if they have any further questions.  Risk consent form and Pain Medication Agreement to be scanned into patient's chart.  The consent was obtained with risks and complications reviewed which included bleeding, pain, scar, infection and the risk of anesthesia.  The patients questions were answered to the patients expressed satisfaction.    Electronically signed by: Laurena Spies, PA-C 04/27/2023 12:18 PM

## 2023-04-27 NOTE — Progress Notes (Signed)
 Patient ID: Gabrielle Davidson, female    DOB: 1951-02-16, 73 y.o.   MRN: [id number]  Chief Complaint  Patient presents with   Pre-op Exam      ICD-10-CM   1. S/P breast reconstruction  Z98.890        History of Present Illness: Gabrielle Davidson is a 73 y.o.  female  with a history of breast cancer.  She presents for preoperative evaluation for upcoming procedure, excision of excess right breast tissue with fat grafting and left breast reduction for symmetry, scheduled for 05/19/2023 with Dr. Ulice Bold.  The patient has not had problems with anesthesia.  Patient denies any history of cardiac disease.  She denies taking any blood thinners.  Patient reports she is not a smoker.  Patient denies taking any birth control or hormone replacement.  She denies any history of miscarriages.  She denies any personal family history of blood clots or clotting diseases.  Patient denies any recent traumas, surgeries or infections.  She does state that she is undergoing cataract surgery next week though.  She denies any history of stroke or heart attack.  She denies any history of Crohn's disease, ulcerative colitis, COPD or asthma.  She denies any varicosities to her lower extremities.  She denies any fevers or chills.  She did state that she has a little bit of redness to her right breast that she noticed today.  She states that it is not painful.  She reports it is not swollen.  She denies any fevers or chills.  She reports that throughout the day it has been fading.  Summary of Previous Visit: Patient was most recently seen in the clinic on 04/08/2023.  At this visit, it was noted that patient had a diagnosis of breast cancer in 2022 in the right breast.  Patient underwent a right immediate breast reconstruction with placement of acellular dermal matrix and tissue expander in January 2023.  In April 2023, she had the expander removed and had placement of right Mentor smooth round ultrahigh profile gel  that 35 cc implant with a left breast reduction for improved symmetry.  Patient later underwent lipo filling of the right breast in November 2023 for a total of 300 cc for improved symmetry.  Then in July 2024, patient had a left breast mastopexy reduction with excision of right excess breast tissue for improved symmetry.    At the visit on 04/08/2023, the symmetry was markedly improved but she still had some asymmetry with the left breast being larger than the right.  There were also some irregularities in the contour of the right breast that patient wanted improved.  Plan was to move forward with right breast fat grafting with excision of excess breast tissue medially and left breast reduction for improved symmetry.  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   PMH Significant for: Hypertension, thyroid cancer, breast cancer  Patient does reports she had an allergic reaction to dressings last time.  She is unable to state which dressing she had a reaction to.  Per chart review, it does appear she had a contact dermatitis shortly after surgery.   Past Medical History: Allergies: No Known Allergies  Current Medications:  Current Outpatient Medications:    amitriptyline (ELAVIL) 25 MG tablet, Take 25 mg by mouth at bedtime., Disp: , Rfl:    Ascorbic Acid (VITAMIN C) 1000 MG tablet, Take 1,000 mg by mouth in the  morning and at bedtime., Disp: , Rfl:    Biotin 16109 MCG TABS, Take 10,000 mcg by mouth in the morning and at bedtime., Disp: , Rfl:    Calcium Carbonate-Vitamin D3 600-400 MG-UNIT TABS, Take 1-2 tablets by mouth See admin instructions. 1 tablet in the morning and 2 tablets at bedtime, Disp: , Rfl:    cephALEXin (KEFLEX) 500 MG capsule, Take 1 capsule (500 mg total) by mouth 4 (four) times daily for 3 days., Disp: 12 capsule, Rfl: 0   chlorthalidone (HYGROTON) 25 MG tablet, Take 25 mg by mouth in the morning., Disp: , Rfl:    FLUoxetine  (PROZAC) 40 MG capsule, Take 40 mg by mouth every morning. , Disp: , Rfl:    fluticasone (FLONASE) 50 MCG/ACT nasal spray, Place 2 sprays into both nostrils daily., Disp: , Rfl:    gabapentin (NEURONTIN) 300 MG capsule, Take 600 mg by mouth 2 (two) times daily., Disp: , Rfl:    hydrOXYzine (ATARAX) 10 MG tablet, Take 10 mg by mouth 3 (three) times daily as needed for itching., Disp: , Rfl:    ibuprofen (ADVIL) 200 MG tablet, Take 400-800 mg by mouth every 8 (eight) hours as needed (PAIN.)., Disp: , Rfl:    levothyroxine (SYNTHROID) 100 MCG tablet, Take by mouth. Take 1 tablet 6 days per week. Do not take on Sundays. Take on an empty stomach with a glass of water at least 30-60 minutes before breakfast., Disp: , Rfl:    losartan (COZAAR) 100 MG tablet, Take 100 mg by mouth in the morning., Disp: , Rfl:    meclizine (ANTIVERT) 25 MG tablet, Take 25 mg by mouth 3 (three) times daily as needed for dizziness., Disp: , Rfl:    metFORMIN (GLUCOPHAGE-XR) 500 MG 24 hr tablet, Take 1,000 mg by mouth at bedtime., Disp: , Rfl:    Multiple Vitamin (MULTIVITAMIN WITH MINERALS) TABS tablet, Take 1 tablet by mouth daily., Disp: , Rfl:    ondansetron (ZOFRAN) 4 MG tablet, Take 1 tablet (4 mg total) by mouth every 8 (eight) hours as needed for nausea or vomiting., Disp: 20 tablet, Rfl: 0   ondansetron (ZOFRAN) 4 MG tablet, Take 1 tablet (4 mg total) by mouth every 8 (eight) hours as needed for nausea or vomiting., Disp: 20 tablet, Rfl: 0   oxyCODONE (ROXICODONE) 5 MG immediate release tablet, Take 1 tablet (5 mg total) by mouth every 6 (six) hours as needed for up to 20 doses for severe pain (pain score 7-10)., Disp: 20 tablet, Rfl: 0   tiZANidine (ZANAFLEX) 4 MG tablet, Take 1 tablet (4 mg total) by mouth every 8 (eight) hours as needed for muscle spasms., Disp: 30 tablet, Rfl: 0   vitamin B-12 (CYANOCOBALAMIN) 1000 MCG tablet, Take 1,000 mcg by mouth in the morning., Disp: , Rfl:   Past Medical Problems: Past  Medical History:  Diagnosis Date   Breast cancer (HCC)    Dyspnea    Family history of breast cancer    Family history of colon cancer    Hepatitis    A-YEARS AGO   Hypercholesteremia    Hypertension    Hypothyroidism    Osteopenia of multiple sites    Pneumonia    Statin-induced myositis    Thyroid cancer (HCC)    Wears contact lenses     Past Surgical History: Past Surgical History:  Procedure Laterality Date   BREAST BIOPSY Right 02/10/2021   [place] bx, heart marker,  INVASIVE MAMMARY CARCINOMA   BREAST  BIOPSY Right 02/18/2021   stereo bx, distortion "COIL" clip-BENIGN MAMMARY PARENCHYMA WITH FOCAL PSEUDO   BREAST BIOPSY Left 01/31/2023   path pending/ ribbon clip   BREAST BIOPSY Left 01/31/2023   MM LT BREAST BX W LOC DEV 1ST LESION IMAGE BX SPEC STEREO GUIDE 01/31/2023 [facility]   BREAST CYST EXCISION Right 10/07/2022   Procedure: EXCISION OF RIGHT LATERAL BREAST TISSUE;  Surgeon: Peggye Form, DO;  Location: [facility];  Service: Plastics;  Laterality: Right;   BREAST RECONSTRUCTION WITH PLACEMENT OF TISSUE EXPANDER AND FLEX HD (ACELLULAR HYDRATED DERMIS) Right 04/08/2021   Procedure: RIGHT BREAST RECONSTRUCTION WITH PLACEMENT OF TISSUE EXPANDER AND FLEX HD (ACELLULAR HYDRATED DERMIS);  Surgeon: Peggye Form, DO;  Location: [facility];  Service: Plastics;  Laterality: Right;   BREAST REDUCTION SURGERY Left 06/25/2021   Procedure: MAMMARY REDUCTION  (BREAST) LEFT BREAST;  Surgeon: Peggye Form, DO;  Location: [facility];  Service: Plastics;  Laterality: Left;   COLONOSCOPY W/ POLYPECTOMY     LIPOSUCTION Right 10/07/2022   Procedure: LIPOSUCTION OF RIGHT LATERAL BREAST;  Surgeon: Peggye Form, DO;  Location: [facility];  Service: Plastics;  Laterality: Right;   LIPOSUCTION WITH LIPOFILLING Right 01/14/2022   Procedure: LIPOSUCTION WITH LIPOFILLING;  Surgeon: Peggye Form, DO;  Location:  [facility];  Service: Plastics;  Laterality: Right;   LUMBAR LAMINECTOMY/ DECOMPRESSION WITH MET-RX Right 09/16/2020   Procedure: Right Lumbar five Sacral one Minimally  invasive Discectomy with Metrex;  Surgeon: Jadene Pierini, MD;  Location: [facility];  Service: Neurosurgery;  Laterality: Right;   MASTECTOMY Right    MASTECTOMY WITH AXILLARY LYMPH NODE DISSECTION Right 04/08/2021   Procedure: MASTECTOMY WITH AXILLARY LYMPH NODE DISSECTION;  Surgeon: Earline Mayotte, MD;  Location: [facility];  Service: General;  Laterality: Right;   MASTOPEXY Left 06/25/2021   Procedure: MASTOPEXY LEFT BREAST;  Surgeon: Peggye Form, DO;  Location: [facility];  Service: Plastics;  Laterality: Left;   RECONSTRUCTION OF ANGULAR DEFORMITY,TOE Right 10/15/2021   Procedure: 78295 - RECONSTRUCTION hammertoe OVERLAPPING TOE - SECOND;  Surgeon: Rosetta Posner, DPM;  Location: [facility];  Service: Podiatry;  Laterality: Right;   REDUCTION MAMMAPLASTY     REMOVAL OF BILATERAL TISSUE EXPANDERS WITH PLACEMENT OF BILATERAL BREAST IMPLANTS Right 06/25/2021   Procedure: REMOVAL OF RIGH TISSUE EXPANDERS WITH PLACEMENT OF RIGHT BREAST IMPLANTS;  Surgeon: Peggye Form, DO;  Location: [facility];  Service: Plastics;  Laterality: Right;  1.5 hours   THYROIDECTOMY Bilateral 03/26/2015   Procedure: THYROIDECTOMY;  Surgeon: Geanie Logan, MD;  Location: [facility];  Service: ENT;  Laterality: Bilateral;   UNILATERAL BREAST REDUCTION Left 10/07/2022   Procedure: Left breast reduction for symmetry;  Surgeon: Peggye Form, DO;  [facility];  Service: Plastics;  Laterality: Left;   WEIL OSTEOTOMY Right 10/15/2021   Procedure: 62130 - WEIL OSTEOTOMY - SECOND;  Surgeon: Rosetta Posner, DPM;  Location: [facility];  Service: Podiatry;  Laterality: Right;  Anesthesia: Choice preop pop block   WISDOM TOOTH EXTRACTION      Social History: Social History    Socioeconomic History   Marital status: Married    Spouse name: ,Link Snuffer   Number of children: Not on file   Years of education: Not on file   Highest education level: Not on file  Occupational History   Not on file  Tobacco Use   Smoking status: Former  Current packs/day: 0.00    Average packs/day: 1 pack/day for 20.0 years (20.0 ttl pk-yrs)    Types: Cigarettes    Start date: 03/21/1963    Quit date: 03/21/1983    Years since quitting: 40.1   Smokeless tobacco: Never  Vaping Use   Vaping status: Never Used  Substance and Sexual Activity   Alcohol use: Yes    Comment: social   Drug use: No   Sexual activity: Not Currently    Birth control/protection: Post-menopausal  Other Topics Concern   Not on file  Social History Narrative   Not on file   Social Drivers of Health   Financial Resource Strain: Low Risk  (03/15/2023)   Received from [facility]   Overall Financial Resource Strain (CARDIA)    Difficulty of Paying Living Expenses: Not hard at all  Food Insecurity: No Food Insecurity (03/15/2023)   [facility]   Hunger Vital Sign    Worried About Running Out of Food in the Last Year: Never true    Ran Out of Food in the Last Year: Never true  Transportation Needs: No Transportation Needs (03/15/2023)   Received from [facility] - Transportation    In the past 12 months, has lack of transportation kept you from medical appointments or from getting medications?: No    Lack of Transportation (Non-Medical): No  Physical Activity: Not on file  Stress: Not on file  Social Connections: Not on file  Intimate Partner Violence: Unknown (06/16/2021)   [facility]   HITS    Physically Hurt: Not on file    Insult or Talk Down To: Not on file    Threaten Physical Harm: Not on file    Scream or Curse: Not on file    Family History: Family History  Problem Relation Age  of Onset   Alzheimer's disease Mother    Hernia Mother    Heart disease Father    Colon cancer Maternal Aunt        d. under 63   Breast cancer Paternal Aunt 40   Heart attack Maternal Grandfather    Diabetes Paternal Grandmother    Breast cancer Cousin 14    Review of Systems: Denies any fevers or chills  Physical Exam: Vital Signs BP 130/66 (BP Location: Right Arm, Patient Position: Sitting, Cuff Size: Normal)   Pulse 75   SpO2 96%   Physical Exam  Constitutional:      General: Not in acute distress.    Appearance: Normal appearance. Not ill-appearing.  HENT:     Head: Normocephalic and atraumatic.  Eyes:     Pupils: Pupils are equal, round Neck:     Musculoskeletal: Normal range of motion.  Cardiovascular:     Rate and Rhythm: Normal rate Pulmonary:     Effort: Pulmonary effort is normal. No respiratory distress.  Musculoskeletal: Normal range of motion.  Skin:    General: Skin is warm and dry.     Findings: No erythema or rash.  Neurological:     Mental Status: Alert and oriented to person, place, and time. Mental status is at baseline.  Psychiatric:        Mood and Affect: Mood normal.        Behavior: Behavior normal.    Assessment/Plan: The patient is scheduled for excision of excess right breast tissue with fat grafting and left breast reduction for symmetry with Dr.  Dillingham.  Risks, benefits, and alternatives of procedure discussed, questions answered and consent obtained.    Smoking Status: Non-smoker; Counseling Given?  N/A Last Mammogram: 01/19/2023 suspicious, patient underwent biopsy on 01/31/2023 which was negative for malignancy  Caprini Score: 7; Risk Factors include: Age, BMI > 25, history of thrombosis and length of planned surgery. Recommendation for mechanical and possible pharmacological prophylaxis. Encourage early ambulation.  It appears patient did not have postoperative Lovenox for prior surgeries.  Pictures obtained:  04/08/2023  Post-op Rx sent to pharmacy:  Keflex, oxycodone-patient states that she still has Zofran at home.  Instructed patient to hold her multivitamins, supplements and ibuprofen 1 week prior to surgery.  Instructed her to hold losartan, her diuretic and amitriptyline the day of surgery.  Discussed with the patient to not take her Zanaflex is the same time as oxycodone as this can be sedating.  Patient expressed understanding.  Instructed the patient to continue to monitor her breast.  She believes that it is red from how she slept on it as it has been fading.  Discussed with her that if she has any increased pain, swelling, fevers, chills or if the redness persists, she should be seen in clinic tomorrow Friday.  Patient expressed understanding.  Patient was provided with the Breast Reduction Risk and  General Surgical Risk consent document and Pain Medication Agreement prior to their appointment.  They had adequate time to read through the risk consent documents and Pain Medication Agreement. We also discussed them in person together during this preop appointment. All of their questions were answered to their satisfaction.  Recommended calling if they have any further questions.  Risk consent form and Pain Medication Agreement to be scanned into patient's chart.  The consent was obtained with risks and complications reviewed which included bleeding, pain, scar, infection and the risk of anesthesia.  The patients questions were answered to the patients expressed satisfaction.    Electronically signed by: Laurena Spies, PA-C 04/27/2023 12:18 PM

## 2023-05-12 NOTE — Discharge Instructions (Signed)

## 2023-05-13 ENCOUNTER — Encounter (HOSPITAL_COMMUNITY): Payer: Medicare HMO

## 2023-05-16 ENCOUNTER — Ambulatory Visit: Payer: Self-pay | Admitting: Anesthesiology

## 2023-05-16 ENCOUNTER — Encounter: Payer: Self-pay | Admitting: Ophthalmology

## 2023-05-16 ENCOUNTER — Other Ambulatory Visit: Payer: Self-pay

## 2023-05-16 ENCOUNTER — Ambulatory Visit
Admission: RE | Admit: 2023-05-16 | Discharge: 2023-05-16 | Disposition: A | Discharge status: Home / Self Care | Payer: Medicare HMO | Attending: Ophthalmology | Admitting: Ophthalmology

## 2023-05-16 ENCOUNTER — Encounter
Admission: RE | Disposition: A | Discharge status: Home / Self Care | Payer: Self-pay | Source: Home / Self Care | Attending: Ophthalmology

## 2023-05-16 DIAGNOSIS — H2511 Age-related nuclear cataract, right eye: Secondary | ICD-10-CM | POA: Diagnosis present

## 2023-05-16 DIAGNOSIS — Z8249 Family history of ischemic heart disease and other diseases of the circulatory system: Secondary | ICD-10-CM | POA: Diagnosis not present

## 2023-05-16 DIAGNOSIS — I10 Essential (primary) hypertension: Secondary | ICD-10-CM | POA: Diagnosis not present

## 2023-05-16 DIAGNOSIS — Z87891 Personal history of nicotine dependence: Secondary | ICD-10-CM | POA: Insufficient documentation

## 2023-05-16 HISTORY — DX: Presence of spectacles and contact lenses: Z97.3

## 2023-05-16 HISTORY — PX: CATARACT EXTRACTION W/PHACO: SHX586

## 2023-05-16 HISTORY — DX: Dyspnea, unspecified: R06.00

## 2023-05-16 SURGERY — PHACOEMULSIFICATION, CATARACT, WITH IOL INSERTION
Anesthesia: Monitor Anesthesia Care | Laterality: Right

## 2023-05-16 MED ORDER — SIGHTPATH DOSE#1 NA HYALUR & NA CHOND-NA HYALUR IO KIT
PACK | INTRAOCULAR | Status: DC | PRN
  Administered 2023-05-16: 1 via OPHTHALMIC

## 2023-05-16 MED ORDER — MIDAZOLAM HCL 2 MG/2ML IJ SOLN
INTRAMUSCULAR | Status: AC
  Filled 2023-05-16: qty 2

## 2023-05-16 MED ORDER — SIGHTPATH DOSE#1 BSS IO SOLN
INTRAOCULAR | Status: DC | PRN
  Administered 2023-05-16: 2 mL

## 2023-05-16 MED ORDER — SIGHTPATH DOSE#1 BSS IO SOLN
INTRAOCULAR | Status: DC | PRN
  Administered 2023-05-16: 15 mL via INTRAOCULAR

## 2023-05-16 MED ORDER — TETRACAINE HCL 0.5 % OP SOLN
1.0000 [drp] | OPHTHALMIC | Status: DC | PRN
  Administered 2023-05-16 (×3): 1 [drp] via OPHTHALMIC

## 2023-05-16 MED ORDER — ARMC OPHTHALMIC DILATING DROPS
OPHTHALMIC | Status: AC
  Filled 2023-05-16: qty 0.5

## 2023-05-16 MED ORDER — MIDAZOLAM HCL 2 MG/2ML IJ SOLN
INTRAMUSCULAR | Status: DC | PRN
  Administered 2023-05-16: 2 mg via INTRAVENOUS

## 2023-05-16 MED ORDER — GLYCOPYRROLATE 0.2 MG/ML IJ SOLN
INTRAMUSCULAR | Status: AC
  Filled 2023-05-16: qty 1

## 2023-05-16 MED ORDER — CEFUROXIME OPHTHALMIC INJECTION 1 MG/0.1 ML
INJECTION | OPHTHALMIC | Status: DC | PRN
  Administered 2023-05-16: 1 mg via INTRACAMERAL

## 2023-05-16 MED ORDER — TETRACAINE HCL 0.5 % OP SOLN
OPHTHALMIC | Status: AC
  Filled 2023-05-16: qty 4

## 2023-05-16 MED ORDER — ARMC OPHTHALMIC DILATING DROPS
1.0000 | OPHTHALMIC | Status: DC | PRN
  Administered 2023-05-16 (×3): 1 via OPHTHALMIC

## 2023-05-16 MED ORDER — BRIMONIDINE TARTRATE-TIMOLOL 0.2-0.5 % OP SOLN
OPHTHALMIC | Status: DC | PRN
  Administered 2023-05-16: 1 [drp] via OPHTHALMIC

## 2023-05-16 MED ORDER — SIGHTPATH DOSE#1 BSS IO SOLN
INTRAOCULAR | Status: DC | PRN
  Administered 2023-05-16: 72 mL via OPHTHALMIC

## 2023-05-16 SURGICAL SUPPLY — 11 items
CATARACT SUITE SIGHTPATH (MISCELLANEOUS) ×1 IMPLANT
FEE CATARACT SUITE SIGHTPATH (MISCELLANEOUS) ×1 IMPLANT
GLOVE BIOGEL PI IND STRL 8 (GLOVE) ×1 IMPLANT
GLOVE SURG LX STRL 7.5 STRW (GLOVE) ×1 IMPLANT
GLOVE SURG PROTEXIS BL SZ6.5 (GLOVE) ×1 IMPLANT
GLOVE SURG SYN 6.5 PF PI BL (GLOVE) ×1 IMPLANT
LENS IOL EYHANCE TRC 150 18.5 IMPLANT
LENS IOL EYHNC TORIC 150 18.5 ×1 IMPLANT
NDL FILTER BLUNT 18X1 1/2 (NEEDLE) ×1 IMPLANT
NEEDLE FILTER BLUNT 18X1 1/2 (NEEDLE) ×1 IMPLANT
SYR 3ML LL SCALE MARK (SYRINGE) ×1 IMPLANT

## 2023-05-16 NOTE — H&P (Signed)
 [facility]   Primary Care Physician:  Marguarite Arbour, MD Ophthalmologist: Dr. Lockie Mola  Pre-Procedure History & Physical: HPI:  Gabrielle Davidson is a 73 y.o. female here for ophthalmic surgery.   Past Medical History:  Diagnosis Date   Breast cancer (HCC)    Dyspnea    Family history of breast cancer    Family history of colon cancer    Hepatitis    A-YEARS AGO   Hypercholesteremia    Hypertension    Hypothyroidism    Osteopenia of multiple sites    Pneumonia    Statin-induced myositis    Thyroid cancer (HCC)    Wears contact lenses     Past Surgical History:  Procedure Laterality Date   BREAST BIOPSY Right 02/10/2021   [place] bx, heart marker,  INVASIVE MAMMARY CARCINOMA   BREAST BIOPSY Right 02/18/2021   stereo bx, distortion "COIL" clip-BENIGN MAMMARY PARENCHYMA WITH FOCAL PSEUDO   BREAST BIOPSY Left 01/31/2023   path pending/ ribbon clip   BREAST BIOPSY Left 01/31/2023   MM LT BREAST BX W LOC DEV 1ST LESION IMAGE BX SPEC STEREO GUIDE 01/31/2023 [facility]   BREAST CYST EXCISION Right 10/07/2022   Procedure: EXCISION OF RIGHT LATERAL BREAST TISSUE;  Surgeon: Peggye Form, DO;  Location: [facility];  Service: Plastics;  Laterality: Right;   BREAST RECONSTRUCTION WITH PLACEMENT OF TISSUE EXPANDER AND FLEX HD (ACELLULAR HYDRATED DERMIS) Right 04/08/2021   Procedure: RIGHT BREAST RECONSTRUCTION WITH PLACEMENT OF TISSUE EXPANDER AND FLEX HD (ACELLULAR HYDRATED DERMIS);  Surgeon: Peggye Form, DO;  Location: [facility];  Service: Plastics;  Laterality: Right;   BREAST REDUCTION SURGERY Left 06/25/2021   Procedure: MAMMARY REDUCTION  (BREAST) LEFT BREAST;  Surgeon: Peggye Form, DO;  Location: [facility];  Service: Plastics;  Laterality: Left;   COLONOSCOPY W/ POLYPECTOMY     LIPOSUCTION Right 10/07/2022   Procedure: LIPOSUCTION OF RIGHT LATERAL BREAST;  Surgeon: Peggye Form, DO;   Location: [facility];  Service: Plastics;  Laterality: Right;   LIPOSUCTION WITH LIPOFILLING Right 01/14/2022   Procedure: LIPOSUCTION WITH LIPOFILLING;  Surgeon: Peggye Form, DO;  Location: [facility];  Service: Plastics;  Laterality: Right;   LUMBAR LAMINECTOMY/ DECOMPRESSION WITH MET-RX Right 09/16/2020   Procedure: Right Lumbar five Sacral one Minimally  invasive Discectomy with Metrex;  Surgeon: Jadene Pierini, MD;  Location: [facility];  Service: Neurosurgery;  Laterality: Right;   MASTECTOMY Right    MASTECTOMY WITH AXILLARY LYMPH NODE DISSECTION Right 04/08/2021   Procedure: MASTECTOMY WITH AXILLARY LYMPH NODE DISSECTION;  Surgeon: Earline Mayotte, MD;  Location: [facility];  Service: General;  Laterality: Right;   MASTOPEXY Left 06/25/2021   Procedure: MASTOPEXY LEFT BREAST;  Surgeon: Peggye Form, DO;  Location: [facility];  Service: Plastics;  Laterality: Left;   RECONSTRUCTION OF ANGULAR DEFORMITY,TOE Right 10/15/2021   Procedure: 96295 - RECONSTRUCTION hammertoe OVERLAPPING TOE - SECOND;  Surgeon: Rosetta Posner, DPM;  Location: [facility];  Service: Podiatry;  Laterality: Right;   REDUCTION MAMMAPLASTY     REMOVAL OF BILATERAL TISSUE EXPANDERS WITH PLACEMENT OF BILATERAL BREAST IMPLANTS Right 06/25/2021   Procedure: REMOVAL OF RIGH TISSUE EXPANDERS WITH PLACEMENT OF RIGHT BREAST IMPLANTS;  Surgeon: Peggye Form, DO;  Location: [facility];  Service: Plastics;  Laterality: Right;  1.5 hours   THYROIDECTOMY Bilateral 03/26/2015   Procedure: THYROIDECTOMY;  Surgeon: Geanie Logan, MD;  Location:  [facility];  Service: ENT;  Laterality: Bilateral;   UNILATERAL BREAST REDUCTION Left 10/07/2022   Procedure: Left breast reduction for symmetry;  Surgeon: Peggye Form, DO;  Location: [facility];  Service: Plastics;  Laterality: Left;   WEIL OSTEOTOMY Right 10/15/2021   Procedure: 16109 - WEIL  OSTEOTOMY - SECOND;  Surgeon: Rosetta Posner, DPM;  Location: [facility];  Service: Podiatry;  Laterality: Right;  Anesthesia: Choice preop pop block   WISDOM TOOTH EXTRACTION      Prior to Admission medications   Medication Sig Start Date End Date Taking? Authorizing Provider  amitriptyline (ELAVIL) 10 MG tablet Take 20 mg by mouth at bedtime.   Yes [provider]  Ascorbic Acid (VITAMIN C) 1000 MG tablet Take 1,000 mg by mouth in the morning and at bedtime.   Yes [provider]  Biotin 60454 MCG TABS Take 10,000 mcg by mouth in the morning and at bedtime.   Yes [provider]  Calcium Carbonate-Vitamin D3 600-400 MG-UNIT TABS Take 1-2 tablets by mouth See admin instructions. 1 tablet in the morning and 2 tablets at bedtime   Yes [provider]  chlorthalidone (HYGROTON) 25 MG tablet Take 25 mg by mouth in the morning. 10/11/18  Yes [provider]  FLUoxetine (PROZAC) 40 MG capsule Take 40 mg by mouth every morning.    Yes [provider]  fluticasone (FLONASE) 50 MCG/ACT nasal spray Place 2 sprays into both nostrils daily as needed for allergies. 08/26/22  Yes [provider]  ibuprofen (ADVIL) 200 MG tablet Take 400-800 mg by mouth every 8 (eight) hours as needed for moderate pain (pain score 4-6).   Yes [provider]  levothyroxine (SYNTHROID) 100 MCG tablet Take 100 mcg by mouth See admin instructions. Take 100 mcg by mouth Monday-Saturday. Do not take on Sundays. Take on an empty stomach with a glass of water at least 30-60 minutes before breakfast. 09/30/22  Yes [provider]  losartan (COZAAR) 100 MG tablet Take 100 mg by mouth in the morning. 04/13/18  Yes [provider]  meclizine (ANTIVERT) 25 MG tablet Take 25 mg by mouth 3 (three) times daily as needed for dizziness.   Yes [provider]  potassium chloride (MICRO-K) 10 MEQ CR capsule Take 10 mEq by mouth 2 (two) times daily. 03/15/23  03/14/24 Yes [provider]  tiZANidine (ZANAFLEX) 4 MG tablet Take 1 tablet (4 mg total) by mouth every 8 (eight) hours as needed for muscle spasms. 09/17/20  Yes Jadene Pierini, MD  vitamin B-12 (CYANOCOBALAMIN) 1000 MCG tablet Take 1,000 mcg by mouth in the morning.   Yes [provider]  ondansetron (ZOFRAN) 4 MG tablet Take 1 tablet (4 mg total) by mouth every 8 (eight) hours as needed for nausea or vomiting. Patient not taking: Reported on 05/13/2023 10/15/21   Rosetta Posner, DPM  ondansetron (ZOFRAN) 4 MG tablet Take 1 tablet (4 mg total) by mouth every 8 (eight) hours as needed for nausea or vomiting. Patient not taking: Reported on 05/13/2023 09/10/22   Scheeler, Kermit Balo, PA-C  oxyCODONE (ROXICODONE) 5 MG immediate release tablet Take 1 tablet (5 mg total) by mouth every 6 (six) hours as needed for up to 20 doses for severe pain (pain score 7-10). Patient not taking: Reported on 05/13/2023 04/27/23   Laurena Spies, PA-C    Allergies as of 04/07/2023   (No Known Allergies)    Family History  Problem Relation Age  of Onset   Alzheimer's disease Mother    Hernia Mother    Heart disease Father    Colon cancer Maternal Aunt        d. under 48   Breast cancer Paternal Aunt 40   Heart attack Maternal Grandfather    Diabetes Paternal Grandmother    Breast cancer Cousin 59    Social History   Socioeconomic History   Marital status: Married    Spouse name: ,Link Snuffer   Number of children: Not on file   Years of education: Not on file   Highest education level: Not on file  Occupational History   Not on file  Tobacco Use   Smoking status: Former    Current packs/day: 0.00    Average packs/day: 1 pack/day for 20.0 years (20.0 ttl pk-yrs)    Types: Cigarettes    Start date: 03/21/1963    Quit date: 03/21/1983    Years since quitting: 40.1   Smokeless tobacco: Never  Vaping Use   Vaping status: Never Used  Substance and Sexual Activity   Alcohol use: Yes     Comment: social   Drug use: No   Sexual activity: Not Currently    Birth control/protection: Post-menopausal  Other Topics Concern   Not on file  Social History Narrative   Not on file   Social Drivers of Health   Financial Resource Strain: Low Risk  (03/15/2023)   Received from [facility]   Overall Financial Resource Strain (CARDIA)    Difficulty of Paying Living Expenses: Not hard at all  Food Insecurity: No Food Insecurity (03/15/2023)   Received from [facility]   Hunger Vital Sign    Worried About Running Out of Food in the Last Year: Never true    Ran Out of Food in the Last Year: Never true  Transportation Needs: No Transportation Needs (03/15/2023)   Received from [facility] - Transportation    In the past 12 months, has lack of transportation kept you from medical appointments or from getting medications?: No    Lack of Transportation (Non-Medical): No  Physical Activity: Not on file  Stress: Not on file  Social Connections: Not on file  Intimate Partner Violence: Unknown (06/16/2021)   Received from [facility]   HITS    Physically Hurt: Not on file    Insult or Talk Down To: Not on file    Threaten Physical Harm: Not on file    Scream or Curse: Not on file    Review of Systems: See HPI, otherwise negative ROS  Physical Exam: BP 121/68   Pulse 61   Temp (!) 97.2 F (36.2 C) (Temporal)   Resp 12   Ht 4\' 10"  (1.473 m)   Wt 64.9 kg   SpO2 97%   BMI 29.89 kg/m  General:   Alert,  pleasant and cooperative in NAD Head:  Normocephalic and atraumatic. Lungs:  Clear to auscultation.    Heart:  Regular rate and rhythm.   Impression/Plan: Gabrielle Davidson is here for ophthalmic surgery.  Risks, benefits, limitations, and alternatives regarding ophthalmic surgery have been reviewed with the patient.  Questions have been answered.  All parties agreeable.   Lockie Mola,  MD  05/16/2023, 7:26 AM

## 2023-05-16 NOTE — Pre-Procedure Instructions (Signed)
 Surgical Instructions   Your procedure is scheduled on May 19, 2023. Report to [facility] Main Entrance "A" at 5:30 A.M., then check in with the Admitting office. Any questions or running late day of surgery: call [phone]  Questions prior to your surgery date: call [phone], Monday-Friday, 8am-4pm. If you experience any cold or flu symptoms such as cough, fever, chills, shortness of breath, etc. between now and your scheduled surgery, please notify us at the above number.     Remember:  Do not eat after midnight the night before your surgery   You may drink clear liquids until 4:30 AM the morning of your surgery.   Clear liquids allowed are: Water, Non-Citrus Juices (without pulp), Carbonated Beverages, Clear Tea (no milk, honey, etc.), Black Coffee Only (NO MILK, CREAM OR POWDERED CREAMER of any kind), and Gatorade.    Take these medicines the morning of surgery with A SIP OF WATER: FLUoxetine (PROZAC)  levothyroxine (SYNTHROID)    May take these medicines IF NEEDED: fluticasone (FLONASE) nasal spray  meclizine (ANTIVERT)  tiZANidine (ZANAFLEX)    One week prior to surgery, STOP taking any Aspirin (unless otherwise instructed by your surgeon) Aleve, Naproxen, Ibuprofen, Motrin, Advil, Goody's, BC's, all herbal medications, fish oil, and non-prescription vitamins.                     Do NOT Smoke (Tobacco/Vaping) for 24 hours prior to your procedure.  If you use a CPAP at night, you may bring your mask/headgear for your overnight stay.   You will be asked to remove any contacts, glasses, piercing's, hearing aid's, dentures/partials prior to surgery. Please bring cases for these items if needed.    Patients discharged the day of surgery will not be allowed to drive home, and someone needs to stay with them for 24 hours.  SURGICAL WAITING ROOM VISITATION Patients may have no more than 2 support people in the waiting area - these visitors may rotate.   Pre-op nurse  will coordinate an appropriate time for 1 ADULT support person, who may not rotate, to accompany patient in pre-op.  Children under the age of 90 must have an adult with them who is not the patient and must remain in the main waiting area with an adult.  If the patient needs to stay at the hospital during part of their recovery, the visitor guidelines for inpatient rooms apply.  Please refer to the [facility] website for the visitor guidelines for any additional information.   If you received a COVID test during your pre-op visit  it is requested that you wear a mask when out in public, stay away from anyone that may not be feeling well and notify your surgeon if you develop symptoms. If you have been in contact with anyone that has tested positive in the last 10 days please notify you surgeon.      Pre-operative CHG Bathing Instructions   You can play a key role in reducing the risk of infection after surgery. Your skin needs to be as free of germs as possible. You can reduce the number of germs on your skin by washing with CHG (chlorhexidine gluconate) soap before surgery. CHG is an antiseptic soap that kills germs and continues to kill germs even after washing.   DO NOT use if you have an allergy to chlorhexidine/CHG or antibacterial soaps. If your skin becomes reddened or irritated, stop using the CHG and notify one of our RNs at [phone].  TAKE A SHOWER THE NIGHT BEFORE SURGERY AND THE DAY OF SURGERY    Please keep in mind the following:  DO NOT shave, including legs and underarms, 48 hours prior to surgery.   You may shave your face before/day of surgery.  Place clean sheets on your bed the night before surgery Use a clean washcloth (not used since being washed) for each shower. DO NOT sleep with pet's night before surgery.  CHG Shower Instructions:  Wash your face and private area with normal soap. If you choose to wash your hair, wash first with your normal  shampoo.  After you use shampoo/soap, rinse your hair and body thoroughly to remove shampoo/soap residue.  Turn the water OFF and apply half the bottle of CHG soap to a CLEAN washcloth.  Apply CHG soap ONLY FROM YOUR NECK DOWN TO YOUR TOES (washing for 3-5 minutes)  DO NOT use CHG soap on face, private areas, open wounds, or sores.  Pay special attention to the area where your surgery is being performed.  If you are having back surgery, having someone wash your back for you may be helpful. Wait 2 minutes after CHG soap is applied, then you may rinse off the CHG soap.  Pat dry with a clean towel  Put on clean pajamas    Additional instructions for the day of surgery: DO NOT APPLY any lotions, deodorants, cologne, or perfumes.   Do not wear jewelry or makeup Do not wear nail polish, gel polish, artificial nails, or any other type of covering on natural nails (fingers and toes) Do not bring valuables to the hospital. [facility] is not responsible for valuables/personal belongings. Put on clean/comfortable clothes.  Please brush your teeth.  Ask your nurse before applying any prescription medications to the skin.

## 2023-05-16 NOTE — Op Note (Signed)
 LOCATION:  [facility]   PREOPERATIVE DIAGNOSIS:  Nuclear sclerotic cataract of the right eye.  H25.11   POSTOPERATIVE DIAGNOSIS:  Nuclear sclerotic cataract of the right eye.   PROCEDURE:  Phacoemulsification with Toric posterior chamber intraocular lens placement of the right eye.  Ultrasound time: Procedure(s): CATARACT EXTRACTION PHACO AND INTRAOCULAR LENS PLACEMENT (IOC) RIGHT EYHANCE TORIC 9.59 00:36.7 (Right)  LENS:   Implant Name Type Inv. Item Serial No. Manufacturer Lot No. LRB No. Used Action  LENS IOL [facility] TORIC 150 18.5 - [id number]  LENS IOL EYHNC TORIC 150 18.5 [id number] SIGHTPATH  Right 1 Implanted     [id number] 18.5D Toric intraocular lens with 1.5 diopters of cylindrical power with axis orientation at 91 degrees.   SURGEON:  Deirdre Evener, MD   ANESTHESIA: Topical with tetracaine drops and 2% Xylocaine jelly, augmented with 1% preservative-free intracameral lidocaine. .   COMPLICATIONS:  None.   DESCRIPTION OF PROCEDURE:  The patient was identified in the holding room and transported to the operating suite and placed in the supine position under the operating microscope.  The right eye was identified as the operative eye, and it was prepped and draped in the usual sterile ophthalmic fashion.    A clear-corneal paracentesis incision was made at the 12:00 position.  0.5 ml of preservative-free 1% lidocaine was injected into the anterior chamber. The anterior chamber was filled with Viscoat.  A 2.4 millimeter near clear corneal incision was then made at the 9:00 position.  A cystotome and capsulorrhexis forceps were then used to make a curvilinear capsulorrhexis.  Hydrodissection and hydrodelineation were then performed using balanced salt solution.   Phacoemulsification was then used in stop and chop fashion to remove the lens, nucleus and epinucleus.  The remaining cortex was aspirated using the irrigation and aspiration handpiece.  Provisc viscoelastic  was then placed into the capsular bag to distend it for lens placement.  The Verion digital marker was used to align the implant at the intended axis.   A Toric lens was then injected into the capsular bag.  It was rotated clockwise until the axis marks on the lens were approximately 15 degrees in the counterclockwise direction to the intended alignment.  The viscoelastic was aspirated from the eye using the irrigation aspiration handpiece.  Then, a Koch spatula through the sideport incision was used to rotate the lens in a clockwise direction until the axis markings of the intraocular lens were lined up with the Verion alignment.  Balanced salt solution was then used to hydrate the wounds. Cefuroxime 0.1 ml of a 10mg /ml solution was injected into the anterior chamber for a dose of 1 mg of intracameral antibiotic at the completion of the case.    The eye was noted to have a physiologic pressure and there was no wound leak noted.   Timolol and Brimonidine drops were applied to the eye.  The patient was taken to the recovery room in stable condition having had no complications of anesthesia or surgery.  Gabrielle Davidson 05/16/2023, 8:14 AM

## 2023-05-16 NOTE — Anesthesia Postprocedure Evaluation (Signed)
 Anesthesia Post Note  Patient: Steph R Snook  Procedure(s) Performed: CATARACT EXTRACTION PHACO AND INTRAOCULAR LENS PLACEMENT (IOC) RIGHT EYHANCE TORIC 9.59 00:36.7 (Right)  Patient location during evaluation: PACU Anesthesia Type: MAC Level of consciousness: awake and alert Pain management: pain level controlled Vital Signs Assessment: post-procedure vital signs reviewed and stable Respiratory status: spontaneous breathing, nonlabored ventilation, respiratory function stable and patient connected to nasal cannula oxygen Cardiovascular status: stable and blood pressure returned to baseline Postop Assessment: no apparent nausea or vomiting Anesthetic complications: no   No notable events documented.   Last Vitals:  Vitals:   05/16/23 0815 05/16/23 0820  BP: 107/79 126/67  Pulse: 62 62  Resp: (!) 23 18  Temp: (!) 36.4 C (!) 36.4 C  SpO2: 97% 96%    Last Pain:  Vitals:   05/16/23 0820  TempSrc:   PainSc: 0-No pain                 Shary Lamos C Joeleen Wortley

## 2023-05-16 NOTE — Transfer of Care (Signed)
 Immediate Anesthesia Transfer of Care Note  Patient: Gabrielle Davidson  Procedure(s) Performed: CATARACT EXTRACTION PHACO AND INTRAOCULAR LENS PLACEMENT (IOC) RIGHT EYHANCE TORIC 9.59 00:36.7 (Right)  Patient Location: PACU  Anesthesia Type: MAC  Level of Consciousness: awake, alert  and patient cooperative  Airway and Oxygen Therapy: Patient Spontanous Breathing and Patient connected to supplemental oxygen  Post-op Assessment: Post-op Vital signs reviewed, Patient's Cardiovascular Status Stable, Respiratory Function Stable, Patent Airway and No signs of Nausea or vomiting  Post-op Vital Signs: Reviewed and stable  Complications: No notable events documented.

## 2023-05-16 NOTE — Anesthesia Preprocedure Evaluation (Signed)
 Anesthesia Evaluation  Patient identified by MRN, date of birth, ID band Patient awake    Reviewed: Allergy & Precautions, H&P , NPO status , Patient's Chart, lab work & pertinent test results  Airway Mallampati: I  TM Distance: >3 FB Neck ROM: Full    Dental no notable dental hx.    Pulmonary neg pulmonary ROS, shortness of breath, pneumonia, former smoker   Pulmonary exam normal breath sounds clear to auscultation       Cardiovascular hypertension, negative cardio ROS Normal cardiovascular exam Rhythm:Regular Rate:Normal     Neuro/Psych  PSYCHIATRIC DISORDERS       Neuromuscular disease negative neurological ROS  negative psych ROS   GI/Hepatic negative GI ROS, Neg liver ROS,,,(+) Hepatitis -  Endo/Other  negative endocrine ROSHypothyroidism    Renal/GU negative Renal ROS  negative genitourinary   Musculoskeletal negative musculoskeletal ROS (+)    Abdominal   Peds negative pediatric ROS (+)  Hematology negative hematology ROS (+)   Anesthesia Other Findings Hypertension  Hepatitis Hypothyroidism  Thyroid cancer (HCC) Breast cancer (HCC)  Osteopenia of multiple sites Hypercholesteremia  Statin-induced myositis Pneumonia  Dyspnea Wears contact lenses      Reproductive/Obstetrics negative OB ROS                              Anesthesia Physical Anesthesia Plan  ASA: 3  Anesthesia Plan: MAC   Post-op Pain Management:    Induction: Intravenous  PONV Risk Score and Plan:   Airway Management Planned: Natural Airway and Nasal Cannula  Additional Equipment:   Intra-op Plan:   Post-operative Plan:   Informed Consent: I have reviewed the patients History and Physical, chart, labs and discussed the procedure including the risks, benefits and alternatives for the proposed anesthesia with the patient or authorized representative who has indicated his/her understanding and  acceptance.     Dental Advisory Given  Plan Discussed with: Anesthesiologist, CRNA and Surgeon  Anesthesia Plan Comments: (Patient consented for risks of anesthesia including but not limited to:  - adverse reactions to medications - damage to eyes, teeth, lips or other oral mucosa - nerve damage due to positioning  - sore throat or hoarseness - Damage to heart, brain, nerves, lungs, other parts of body or loss of life  Patient voiced understanding and assent.)         Anesthesia Quick Evaluation

## 2023-05-17 ENCOUNTER — Encounter: Payer: Self-pay | Admitting: Ophthalmology

## 2023-05-17 ENCOUNTER — Encounter (HOSPITAL_COMMUNITY): Payer: Self-pay

## 2023-05-17 ENCOUNTER — Other Ambulatory Visit: Payer: Self-pay

## 2023-05-17 ENCOUNTER — Encounter (HOSPITAL_COMMUNITY)
Admission: RE | Admit: 2023-05-17 | Discharge: 2023-05-17 | Disposition: A | Discharge status: Home / Self Care | Payer: Medicare HMO | Source: Ambulatory Visit | Attending: Plastic Surgery | Admitting: Plastic Surgery

## 2023-05-17 VITALS — BP 116/61 | HR 80 | Temp 98.5°F | Ht <= 58 in | Wt 144.0 lb

## 2023-05-17 DIAGNOSIS — Z01812 Encounter for preprocedural laboratory examination: Secondary | ICD-10-CM | POA: Insufficient documentation

## 2023-05-17 DIAGNOSIS — Z85850 Personal history of malignant neoplasm of thyroid: Secondary | ICD-10-CM | POA: Diagnosis not present

## 2023-05-17 DIAGNOSIS — Z853 Personal history of malignant neoplasm of breast: Secondary | ICD-10-CM | POA: Diagnosis not present

## 2023-05-17 DIAGNOSIS — Z87891 Personal history of nicotine dependence: Secondary | ICD-10-CM | POA: Diagnosis not present

## 2023-05-17 DIAGNOSIS — I10 Essential (primary) hypertension: Secondary | ICD-10-CM | POA: Diagnosis not present

## 2023-05-17 DIAGNOSIS — E7800 Pure hypercholesterolemia, unspecified: Secondary | ICD-10-CM | POA: Diagnosis not present

## 2023-05-17 DIAGNOSIS — E890 Postprocedural hypothyroidism: Secondary | ICD-10-CM | POA: Insufficient documentation

## 2023-05-17 DIAGNOSIS — Z01818 Encounter for other preprocedural examination: Secondary | ICD-10-CM

## 2023-05-17 LAB — COMPREHENSIVE METABOLIC PANEL
ALT: 31 U/L (ref 0–44)
AST: 25 U/L (ref 15–41)
Albumin: 3.9 g/dL (ref 3.5–5.0)
Alkaline Phosphatase: 67 U/L (ref 38–126)
Anion gap: 11 (ref 5–15)
BUN: 25 mg/dL — ABNORMAL HIGH (ref 8–23)
CO2: 25 mmol/L (ref 22–32)
Calcium: 9.8 mg/dL (ref 8.9–10.3)
Chloride: 100 mmol/L (ref 98–111)
Creatinine, Ser: 0.73 mg/dL (ref 0.44–1.00)
GFR, Estimated: >60 mL/min (ref >60)
Glucose, Bld: 101 mg/dL — ABNORMAL HIGH (ref 70–99)
Potassium: 3.3 mmol/L — ABNORMAL LOW (ref 3.5–5.1)
Sodium: 136 mmol/L (ref 135–145)
Total Bilirubin: 0.5 mg/dL (ref 0.0–1.2)
Total Protein: 6.8 g/dL (ref 6.5–8.1)

## 2023-05-17 LAB — CBC
HCT: 39.8 % (ref 36.0–46.0)
Hemoglobin: 13.2 g/dL (ref 12.0–15.0)
MCH: 27.1 pg (ref 26.0–34.0)
MCHC: 33.2 g/dL (ref 30.0–36.0)
MCV: 81.7 fL (ref 80.0–100.0)
Platelets: 300 10*3/uL (ref 150–400)
RBC: 4.87 MIL/uL (ref 3.87–5.11)
RDW: 13.8 % (ref 11.5–15.5)
WBC: 7.3 10*3/uL (ref 4.0–10.5)
nRBC: 0 % (ref 0.0–0.2)

## 2023-05-17 NOTE — Progress Notes (Addendum)
 PCP - [name] Cardiologist - saw Gabrielle Davidson in 2021 for DOE. No follow up. Pt denies any dyspnea at PAT visit.   PPM/ICD - denies Device Orders -  Rep Notified -   Chest x-ray -  EKG - 10/04/22 Stress Test - 02/27/18,CE ECHO - denies Cardiac Cath - denies  Sleep Study - denies CPAP -   Fasting Blood Sugar - na Checks Blood Sugar _____ times a day  Last dose of GLP1 agonist-  na GLP1 instructions:   Blood Thinner Instructions: na Aspirin Instructions:na  ERAS Protcol - clears until 0430 PRE-SURGERY Ensure or G2- no  COVID TEST- na   Anesthesia review: no  Patient denies shortness of breath, fever, cough and chest pain at PAT appointment   All instructions explained to the patient, with a verbal understanding of the material. Patient agrees to go over the instructions while at home for a better understanding. Patient also instructed to wear a mask when out in public prior to surgery. The opportunity to ask questions was provided.

## 2023-05-18 NOTE — Progress Notes (Signed)
 Anesthesia Chart Review:  Case: [id number] Date/Time: 05/19/23 0715   Procedures:      Excision of excess right breast tissue with fat grafting and left breast reduction for symmetry (Right: Breast)     LIPOSUCTION, WITH FAT TRANSFER (Right: Breast)     BREAST REDUCTION WITH LIPOSUCTION (Left: Breast)   Anesthesia type: Choice   Pre-op diagnosis: history of breast reconstruction   [facility] / [facility]   Surgeons: Peggye Form, DO       DISCUSSION: Patient is a 73 year old female scheduled for the above procedure.  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  Anesthesia team to evaluate on the day of surgery.   VS: BP 116/61   Pulse 80   Temp 36.9 C   Ht 4\' 10"  (1.473 m)   Wt 65.3 kg   SpO2 92%   BMI 30.10 kg/m   PROVIDERS: Marguarite Arbour, MD is PCP  Carlena Sax, MD is endocrinologist - She is not currently followed by cardiology, but had an dyspnea evaluation by Harold Hedge, MD [facility]) in 2019 and had reassuring testing. Last visit 10/09/19. She denied dyspnea at PAT RN visit.    LABS: Labs reviewed: Acceptable for surgery. (all labs ordered are listed, but only abnormal results are displayed)  Labs Reviewed  COMPREHENSIVE METABOLIC PANEL - Abnormal; Notable for the following components:      Result Value   Potassium 3.3 (*)    Glucose, Bld 101 (*)    BUN 25 (*)    All other components within normal limits  CBC     EKG: 10/04/22: Sinus bradycardia at 58 bpm Nonspecific T  wave abnormality Abnormal ECG When compared with ECG of 31-Mar-2021 13:35, PREVIOUS ECG IS PRESENT Since last tracing Poor R wave progression resolved. Confirmed by Rinaldo Cloud [phone]) on 10/06/2022 6:13:45 PM   CV: Exercise Myocardial Perfusion Study 02/27/18 ([facility]): IMPRESSION: Negative ETT with fair exercise tolerance.  LV function normal.  No  evidence of reversible ischemia.  Low risk study.   Echo 06/28/17 ([facility]): INTERPRETATION  NORMAL LEFT VENTRICULAR SYSTOLIC FUNCTION  NORMAL RIGHT VENTRICULAR SYSTOLIC FUNCTION  MILD VALVULAR REGURGITATION (mild MR, mild TR, mild PR)  NO VALVULAR STENOSIS   Past Medical History:  Diagnosis Date   Breast cancer (HCC)    Dyspnea    Family history of breast cancer    Family history of colon cancer    Hepatitis    A-YEARS AGO   Hypercholesteremia    Hypertension    Hypothyroidism    Osteopenia of multiple sites    Pneumonia    Statin-induced myositis    Thyroid cancer (HCC)    Wears contact lenses     Past Surgical History:  Procedure Laterality Date   BREAST BIOPSY Right 02/10/2021   [place] bx, heart marker,  INVASIVE MAMMARY CARCINOMA   BREAST BIOPSY Right 02/18/2021   stereo bx, distortion "COIL" clip-BENIGN MAMMARY PARENCHYMA WITH FOCAL PSEUDO   BREAST BIOPSY Left 01/31/2023   path pending/ ribbon clip   BREAST BIOPSY Left 01/31/2023   MM LT BREAST BX W LOC DEV 1ST LESION  IMAGE BX SPEC STEREO GUIDE 01/31/2023 [facility]   BREAST CYST EXCISION Right 10/07/2022   Procedure: EXCISION OF RIGHT LATERAL BREAST TISSUE;  Surgeon: Peggye Form, DO;  Location: [facility];  Service: Plastics;  Laterality: Right;   BREAST RECONSTRUCTION WITH PLACEMENT OF TISSUE EXPANDER AND FLEX HD (ACELLULAR HYDRATED DERMIS) Right 04/08/2021   Procedure: RIGHT BREAST RECONSTRUCTION WITH PLACEMENT OF TISSUE EXPANDER AND FLEX HD (ACELLULAR HYDRATED DERMIS);  Surgeon: Peggye Form, DO;  Location: [facility];   Service: Plastics;  Laterality: Right;   BREAST REDUCTION SURGERY Left 06/25/2021   Procedure: MAMMARY REDUCTION  (BREAST) LEFT BREAST;  Surgeon: Peggye Form, DO;  Location: [facility];  Service: Plastics;  Laterality: Left;   CATARACT EXTRACTION W/PHACO Right 05/16/2023   Procedure: CATARACT EXTRACTION PHACO AND INTRAOCULAR LENS PLACEMENT (IOC) RIGHT EYHANCE TORIC 9.59 00:36.7;  Surgeon: Lockie Mola, MD;  Location: [facility];  Service: Ophthalmology;  Laterality: Right;   COLONOSCOPY W/ POLYPECTOMY     LIPOSUCTION Right 10/07/2022   Procedure: LIPOSUCTION OF RIGHT LATERAL BREAST;  Surgeon: Peggye Form, DO;  Location: [facility];  Service: Plastics;  Laterality: Right;   LIPOSUCTION WITH LIPOFILLING Right 01/14/2022   Procedure: LIPOSUCTION WITH LIPOFILLING;  Surgeon: Peggye Form, DO;  Location: [facility];  Service: Plastics;  Laterality: Right;   LUMBAR LAMINECTOMY/ DECOMPRESSION WITH MET-RX Right 09/16/2020   Procedure: Right Lumbar five Sacral one Minimally  invasive Discectomy with Metrex;  Surgeon: Jadene Pierini, MD;  Location[facility];  Service: Neurosurgery;  Laterality: Right;   MASTECTOMY Right    MASTECTOMY WITH AXILLARY LYMPH NODE DISSECTION Right 04/08/2021   Procedure: MASTECTOMY WITH AXILLARY LYMPH NODE DISSECTION;  Surgeon: Earline Mayotte, MD;  Location: [facility];  Service: General;  Laterality: Right;   MASTOPEXY Left 06/25/2021   Procedure: MASTOPEXY LEFT BREAST;  Surgeon: Peggye Form, DO;  Location: [facility];  Service: Plastics;  Laterality: Left;   RECONSTRUCTION OF ANGULAR DEFORMITY,TOE Right 10/15/2021   Procedure: 16109 - RECONSTRUCTION hammertoe OVERLAPPING TOE - SECOND;  Surgeon: Rosetta Posner, DPM;  Location: [facility];  Service: Podiatry;  Laterality: Right;   REDUCTION MAMMAPLASTY     REMOVAL OF BILATERAL TISSUE EXPANDERS WITH PLACEMENT OF BILATERAL  BREAST IMPLANTS Right 06/25/2021   Procedure: REMOVAL OF RIGH TISSUE EXPANDERS WITH PLACEMENT OF RIGHT BREAST IMPLANTS;  Surgeon: Peggye Form, DO;  Location: [facility];  Service: Plastics;  Laterality: Right;  1.5 hours   THYROIDECTOMY Bilateral 03/26/2015   Procedure: THYROIDECTOMY;  Surgeon: Geanie Logan, MD;  Location: [facility];  Service: ENT;  Laterality: Bilateral;   UNILATERAL BREAST REDUCTION Left 10/07/2022   Procedure: Left breast reduction for symmetry;  Surgeon: Peggye Form, DO;  Location: [facility];  Service: Plastics;  Laterality: Left;   WEIL OSTEOTOMY Right 10/15/2021   Procedure: 60454 - WEIL OSTEOTOMY - SECOND;  Surgeon: Rosetta Posner, DPM;  Location: [facility];  Service: Podiatry;  Laterality: Right;  Anesthesia: Choice preop pop block   WISDOM TOOTH EXTRACTION      MEDICATIONS:  amitriptyline (ELAVIL) 10 MG tablet   Ascorbic Acid (VITAMIN C) 1000 MG tablet   Biotin 09811 MCG TABS   Calcium Carbonate-Vitamin D3 600-400 MG-UNIT TABS   chlorthalidone (HYGROTON) 25 MG tablet   FLUoxetine (PROZAC) 40 MG capsule   fluticasone (FLONASE) 50 MCG/ACT nasal spray   ibuprofen (ADVIL) 200 MG tablet   levothyroxine (SYNTHROID) 100  MCG tablet   losartan (COZAAR) 100 MG tablet   meclizine (ANTIVERT) 25 MG tablet   ondansetron (ZOFRAN) 4 MG tablet   ondansetron (ZOFRAN) 4 MG tablet   oxyCODONE (ROXICODONE) 5 MG immediate release tablet   potassium chloride (MICRO-K) 10 MEQ CR capsule   tiZANidine (ZANAFLEX) 4 MG tablet   vitamin B-12 (CYANOCOBALAMIN) 1000 MCG tablet   No current facility-administered medications for this encounter.    Shonna Chock, PA-C Surgical Short Stay/Anesthesiology [facility] Phone [phone] [facility] Phone [phone] 05/18/2023 9:45 AM

## 2023-05-18 NOTE — Anesthesia Preprocedure Evaluation (Signed)
 Anesthesia Evaluation  Patient identified by MRN, date of birth, ID band Patient awake    Reviewed: Allergy & Precautions, NPO status , Patient's Chart, lab work & pertinent test results  Airway Mallampati: I  TM Distance: >3 FB Neck ROM: Full    Dental no notable dental hx. (+) Teeth Intact, Dental Advisory Given   Pulmonary shortness of breath, former smoker   Pulmonary exam normal breath sounds clear to auscultation       Cardiovascular hypertension, Pt. on medications Normal cardiovascular exam Rhythm:Regular Rate:Normal  Exercise Myocardial Perfusion Study 02/27/18 ([facility]): IMPRESSION: Negative ETT with fair exercise tolerance.  LV function normal.  No  evidence of reversible ischemia.  Low risk study.    Echo 06/28/17 ([facility]): INTERPRETATION  NORMAL LEFT VENTRICULAR SYSTOLIC FUNCTION  NORMAL RIGHT VENTRICULAR SYSTOLIC FUNCTION  MILD VALVULAR REGURGITATION (mild MR, mild TR, mild PR)  NO VALVULAR STENOSIS     Neuro/Psych negative neurological ROS  negative psych ROS   GI/Hepatic negative GI ROS,,,(+) Hepatitis -, A  Endo/Other  Hypothyroidism    Renal/GU negative Renal ROS  negative genitourinary   Musculoskeletal negative musculoskeletal ROS (+)    Abdominal   Peds  Hematology negative hematology ROS (+)   Anesthesia Other Findings   Reproductive/Obstetrics                             Anesthesia Physical Anesthesia Plan  ASA: 2  Anesthesia Plan: General   Post-op Pain Management: Tylenol PO (pre-op)* and Ketamine IV*   Induction: Intravenous  PONV Risk Score and Plan: 3 and Ondansetron, Dexamethasone and Treatment may vary due to age or medical condition  Airway Management Planned: Oral ETT  Additional Equipment:   Intra-op Plan:   Post-operative Plan: Extubation in OR  Informed Consent: I have reviewed the patients History and Physical, chart, labs and  discussed the procedure including the risks, benefits and alternatives for the proposed anesthesia with the patient or authorized representative who has indicated his/her understanding and acceptance.     Dental advisory given  Plan Discussed with: CRNA  Anesthesia Plan Comments:        Anesthesia Quick Evaluation

## 2023-05-19 ENCOUNTER — Encounter (HOSPITAL_COMMUNITY): Payer: Self-pay | Admitting: Plastic Surgery

## 2023-05-19 ENCOUNTER — Ambulatory Visit (HOSPITAL_COMMUNITY): Payer: Self-pay | Admitting: Vascular Surgery

## 2023-05-19 ENCOUNTER — Ambulatory Visit (HOSPITAL_BASED_OUTPATIENT_CLINIC_OR_DEPARTMENT_OTHER): Payer: Self-pay | Admitting: Anesthesiology

## 2023-05-19 ENCOUNTER — Encounter (HOSPITAL_COMMUNITY)
Admission: RE | Disposition: A | Discharge status: Home / Self Care | Payer: Self-pay | Source: Home / Self Care | Attending: Plastic Surgery

## 2023-05-19 ENCOUNTER — Ambulatory Visit (HOSPITAL_COMMUNITY)
Admission: RE | Admit: 2023-05-19 | Discharge: 2023-05-19 | Disposition: A | Discharge status: Home / Self Care | Payer: Medicare HMO | Attending: Plastic Surgery | Admitting: Plastic Surgery

## 2023-05-19 ENCOUNTER — Other Ambulatory Visit: Payer: Self-pay

## 2023-05-19 DIAGNOSIS — N651 Disproportion of reconstructed breast: Secondary | ICD-10-CM

## 2023-05-19 DIAGNOSIS — Z87891 Personal history of nicotine dependence: Secondary | ICD-10-CM | POA: Insufficient documentation

## 2023-05-19 DIAGNOSIS — Z9011 Acquired absence of right breast and nipple: Secondary | ICD-10-CM | POA: Diagnosis not present

## 2023-05-19 DIAGNOSIS — Z7984 Long term (current) use of oral hypoglycemic drugs: Secondary | ICD-10-CM | POA: Diagnosis not present

## 2023-05-19 DIAGNOSIS — I10 Essential (primary) hypertension: Secondary | ICD-10-CM | POA: Insufficient documentation

## 2023-05-19 DIAGNOSIS — Z833 Family history of diabetes mellitus: Secondary | ICD-10-CM | POA: Diagnosis not present

## 2023-05-19 DIAGNOSIS — Z79899 Other long term (current) drug therapy: Secondary | ICD-10-CM | POA: Insufficient documentation

## 2023-05-19 DIAGNOSIS — Z85850 Personal history of malignant neoplasm of thyroid: Secondary | ICD-10-CM | POA: Insufficient documentation

## 2023-05-19 DIAGNOSIS — E039 Hypothyroidism, unspecified: Secondary | ICD-10-CM | POA: Insufficient documentation

## 2023-05-19 DIAGNOSIS — Z853 Personal history of malignant neoplasm of breast: Secondary | ICD-10-CM

## 2023-05-19 DIAGNOSIS — Z421 Encounter for breast reconstruction following mastectomy: Secondary | ICD-10-CM | POA: Diagnosis not present

## 2023-05-19 DIAGNOSIS — R0602 Shortness of breath: Secondary | ICD-10-CM | POA: Insufficient documentation

## 2023-05-19 DIAGNOSIS — I088 Other rheumatic multiple valve diseases: Secondary | ICD-10-CM | POA: Diagnosis not present

## 2023-05-19 DIAGNOSIS — Z803 Family history of malignant neoplasm of breast: Secondary | ICD-10-CM | POA: Insufficient documentation

## 2023-05-19 HISTORY — PX: BREAST REDUCTION SURGERY: SHX8

## 2023-05-19 HISTORY — PX: LIPOSUCTION WITH LIPOFILLING: SHX6436

## 2023-05-19 SURGERY — REVISION, RECONSTRUCTION, BREAST
Anesthesia: General | Site: Breast | Laterality: Right

## 2023-05-19 MED ORDER — LACTATED RINGERS IV SOLN
Freq: Once | INTRAVENOUS | Status: AC
  Administered 2023-05-19: 1000 mL
  Filled 2023-05-19: qty 30

## 2023-05-19 MED ORDER — FENTANYL CITRATE (PF) 250 MCG/5ML IJ SOLN
INTRAMUSCULAR | Status: AC
  Filled 2023-05-19: qty 5

## 2023-05-19 MED ORDER — OXYCODONE HCL 5 MG/5ML PO SOLN
ORAL | Status: AC
  Filled 2023-05-19: qty 5

## 2023-05-19 MED ORDER — CHLORHEXIDINE GLUCONATE CLOTH 2 % EX PADS: 6.0000 | MEDICATED_PAD | Freq: Once | CUTANEOUS | Status: DC

## 2023-05-19 MED ORDER — MIDAZOLAM HCL 2 MG/2ML IJ SOLN
INTRAMUSCULAR | Status: AC
Start: 2023-05-19 — End: ?
  Filled 2023-05-19: qty 2

## 2023-05-19 MED ORDER — KETAMINE HCL 10 MG/ML IJ SOLN
INTRAMUSCULAR | Status: DC | PRN
  Administered 2023-05-19: 20 mg via INTRAVENOUS
  Administered 2023-05-19: 10 mg via INTRAVENOUS

## 2023-05-19 MED ORDER — PROPOFOL 10 MG/ML IV BOLUS
INTRAVENOUS | Status: DC | PRN
  Administered 2023-05-19: 120 mg via INTRAVENOUS
  Administered 2023-05-19: 30 mg via INTRAVENOUS

## 2023-05-19 MED ORDER — ROCURONIUM BROMIDE 100 MG/10ML IV SOLN
INTRAVENOUS | Status: DC | PRN
  Administered 2023-05-19: 50 mg via INTRAVENOUS

## 2023-05-19 MED ORDER — PHENYLEPHRINE HCL (PRESSORS) 10 MG/ML IV SOLN
INTRAVENOUS | Status: DC | PRN
  Administered 2023-05-19 (×5): 80 ug via INTRAVENOUS

## 2023-05-19 MED ORDER — FENTANYL CITRATE (PF) 100 MCG/2ML IJ SOLN
25.0000 ug | INTRAMUSCULAR | Status: DC | PRN
  Administered 2023-05-19: 50 ug via INTRAVENOUS
  Administered 2023-05-19 (×2): 25 ug via INTRAVENOUS

## 2023-05-19 MED ORDER — MIDAZOLAM HCL 5 MG/5ML IJ SOLN
INTRAMUSCULAR | Status: DC | PRN
  Administered 2023-05-19: 2 mg via INTRAVENOUS

## 2023-05-19 MED ORDER — FENTANYL CITRATE (PF) 250 MCG/5ML IJ SOLN
INTRAMUSCULAR | Status: DC | PRN
  Administered 2023-05-19 (×2): 50 ug via INTRAVENOUS

## 2023-05-19 MED ORDER — LIDOCAINE 2% (20 MG/ML) 5 ML SYRINGE
INTRAMUSCULAR | Status: AC
  Filled 2023-05-19: qty 5

## 2023-05-19 MED ORDER — LIDOCAINE-EPINEPHRINE 1 %-1:100000 IJ SOLN
INTRAMUSCULAR | Status: AC
  Filled 2023-05-19: qty 1

## 2023-05-19 MED ORDER — FENTANYL CITRATE (PF) 100 MCG/2ML IJ SOLN
INTRAMUSCULAR | Status: AC
  Filled 2023-05-19: qty 2

## 2023-05-19 MED ORDER — CHLORHEXIDINE GLUCONATE 0.12 % MT SOLN
15.0000 mL | Freq: Once | OROMUCOSAL | Status: AC
  Administered 2023-05-19: 15 mL via OROMUCOSAL
  Filled 2023-05-19: qty 15

## 2023-05-19 MED ORDER — DEXAMETHASONE SODIUM PHOSPHATE 10 MG/ML IJ SOLN
INTRAMUSCULAR | Status: DC | PRN
  Administered 2023-05-19: 8 mg via INTRAVENOUS

## 2023-05-19 MED ORDER — OXYCODONE HCL 5 MG/5ML PO SOLN
5.0000 mg | Freq: Once | ORAL | Status: AC | PRN
  Administered 2023-05-19: 5 mg via ORAL

## 2023-05-19 MED ORDER — SUGAMMADEX SODIUM 200 MG/2ML IV SOLN
INTRAVENOUS | Status: DC | PRN
  Administered 2023-05-19: 100 mg via INTRAVENOUS

## 2023-05-19 MED ORDER — LIDOCAINE 2% (20 MG/ML) 5 ML SYRINGE
INTRAMUSCULAR | Status: DC | PRN
  Administered 2023-05-19: 60 mg via INTRAVENOUS

## 2023-05-19 MED ORDER — EPHEDRINE SULFATE-NACL 50-0.9 MG/10ML-% IV SOSY
PREFILLED_SYRINGE | INTRAVENOUS | Status: DC | PRN
  Administered 2023-05-19 (×2): 5 mg via INTRAVENOUS
  Administered 2023-05-19 (×2): 10 mg via INTRAVENOUS
  Administered 2023-05-19: 5 mg via INTRAVENOUS
  Administered 2023-05-19: 10 mg via INTRAVENOUS

## 2023-05-19 MED ORDER — ONDANSETRON HCL 4 MG/2ML IJ SOLN
INTRAMUSCULAR | Status: AC
  Filled 2023-05-19: qty 2

## 2023-05-19 MED ORDER — ONDANSETRON HCL 4 MG/2ML IJ SOLN
INTRAMUSCULAR | Status: DC | PRN
  Administered 2023-05-19: 4 mg via INTRAVENOUS

## 2023-05-19 MED ORDER — AMISULPRIDE (ANTIEMETIC) 5 MG/2ML IV SOLN: 10.0000 mg | Freq: Once | INTRAVENOUS | Status: DC | PRN

## 2023-05-19 MED ORDER — PROPOFOL 10 MG/ML IV BOLUS
INTRAVENOUS | Status: AC
  Filled 2023-05-19: qty 20

## 2023-05-19 MED ORDER — CEFAZOLIN SODIUM-DEXTROSE 2-4 GM/100ML-% IV SOLN
2.0000 g | INTRAVENOUS | Status: AC
  Administered 2023-05-19: 2 g via INTRAVENOUS
  Filled 2023-05-19: qty 100

## 2023-05-19 MED ORDER — KETAMINE HCL 50 MG/5ML IJ SOSY
PREFILLED_SYRINGE | INTRAMUSCULAR | Status: AC
  Filled 2023-05-19: qty 5

## 2023-05-19 MED ORDER — BUPIVACAINE LIPOSOME 1.3 % IJ SUSP
INTRAMUSCULAR | Status: AC
  Filled 2023-05-19: qty 20

## 2023-05-19 MED ORDER — SODIUM CHLORIDE (PF) 0.9 % IJ SOLN
INTRAMUSCULAR | Status: AC
  Filled 2023-05-19: qty 20

## 2023-05-19 MED ORDER — ORAL CARE MOUTH RINSE: 15.0000 mL | Freq: Once | OROMUCOSAL | Status: AC

## 2023-05-19 MED ORDER — GLYCOPYRROLATE PF 0.2 MG/ML IJ SOSY
PREFILLED_SYRINGE | INTRAMUSCULAR | Status: DC | PRN
  Administered 2023-05-19: .2 mg via INTRAVENOUS

## 2023-05-19 MED ORDER — GLYCOPYRROLATE PF 0.2 MG/ML IJ SOSY
PREFILLED_SYRINGE | INTRAMUSCULAR | Status: AC
  Filled 2023-05-19: qty 1

## 2023-05-19 MED ORDER — ACETAMINOPHEN 500 MG PO TABS
1000.0000 mg | ORAL_TABLET | Freq: Once | ORAL | Status: AC
  Administered 2023-05-19: 1000 mg via ORAL
  Filled 2023-05-19: qty 2

## 2023-05-19 MED ORDER — OXYCODONE HCL 5 MG PO TABS: 5.0000 mg | ORAL_TABLET | Freq: Once | ORAL | Status: AC | PRN

## 2023-05-19 MED ORDER — LIDOCAINE-EPINEPHRINE 1 %-1:100000 IJ SOLN
INTRAMUSCULAR | Status: DC | PRN
  Administered 2023-05-19: 22 mL via SURGICAL_CAVITY

## 2023-05-19 MED ORDER — ROCURONIUM BROMIDE 10 MG/ML (PF) SYRINGE
PREFILLED_SYRINGE | INTRAVENOUS | Status: AC
Start: 2023-05-19 — End: ?
  Filled 2023-05-19: qty 10

## 2023-05-19 MED ORDER — LACTATED RINGERS IV SOLN: INTRAVENOUS | Status: DC | PRN

## 2023-05-19 MED ORDER — BUPIVACAINE LIPOSOME 1.3 % IJ SUSP
INTRAMUSCULAR | Status: DC | PRN
Start: 2023-05-19 — End: 2023-05-19
  Administered 2023-05-19: 20 mL

## 2023-05-19 MED ORDER — EPHEDRINE 5 MG/ML INJ
INTRAVENOUS | Status: AC
  Filled 2023-05-19: qty 5

## 2023-05-19 MED ORDER — SUGAMMADEX SODIUM 200 MG/2ML IV SOLN
INTRAVENOUS | Status: AC
  Filled 2023-05-19: qty 2

## 2023-05-19 MED ORDER — BUPIVACAINE HCL (PF) 0.25 % IJ SOLN
INTRAMUSCULAR | Status: AC
  Filled 2023-05-19: qty 30

## 2023-05-19 MED ORDER — DEXAMETHASONE SODIUM PHOSPHATE 10 MG/ML IJ SOLN
INTRAMUSCULAR | Status: AC
  Filled 2023-05-19: qty 1

## 2023-05-19 MED ORDER — 0.9 % SODIUM CHLORIDE (POUR BTL) OPTIME
TOPICAL | Status: DC | PRN
  Administered 2023-05-19: 1000 mL

## 2023-05-19 SURGICAL SUPPLY — 76 items
BAG COUNTER SPONGE SURGICOUNT (BAG) ×2 IMPLANT
BAG DECANTER FOR FLEXI CONT (MISCELLANEOUS) ×2 IMPLANT
BINDER ABDOMINAL 12 SM 30-45 (SOFTGOODS) IMPLANT
BINDER BREAST LRG (GAUZE/BANDAGES/DRESSINGS) IMPLANT
BINDER BREAST MEDIUM (GAUZE/BANDAGES/DRESSINGS) IMPLANT
BINDER BREAST XLRG (GAUZE/BANDAGES/DRESSINGS) IMPLANT
BINDER BREAST XXLRG (GAUZE/BANDAGES/DRESSINGS) IMPLANT
BIOPATCH RED 1 DISK 7.0 (GAUZE/BANDAGES/DRESSINGS) IMPLANT
BLADE SURG 10 STRL SS (BLADE) ×2 IMPLANT
BNDG GAUZE DERMACEA FLUFF 4 (GAUZE/BANDAGES/DRESSINGS) IMPLANT
CANISTER SUCT 3000ML PPV (MISCELLANEOUS) ×2 IMPLANT
CHLORAPREP W/TINT 26 (MISCELLANEOUS) ×2 IMPLANT
CLEANSER WND VASHE 34 (WOUND CARE) ×2 IMPLANT
CNTNR URN SCR LID CUP LEK RST (MISCELLANEOUS) IMPLANT
COVER SURGICAL LIGHT HANDLE (MISCELLANEOUS) ×2 IMPLANT
DERMABOND ADVANCED .7 DNX12 (GAUZE/BANDAGES/DRESSINGS) ×6 IMPLANT
DRAIN CHANNEL 19F RND (DRAIN) IMPLANT
DRAPE CHEST BREAST 15X10 FENES (DRAPES) ×2 IMPLANT
DRAPE HALF SHEET 40X57 (DRAPES) ×4 IMPLANT
DRAPE SURG ORHT 6 SPLT 77X108 (DRAPES) ×4 IMPLANT
DRSG EMULSION OIL 3X3 NADH (GAUZE/BANDAGES/DRESSINGS) ×2 IMPLANT
DRSG MEPILEX POST OP 4X8 (GAUZE/BANDAGES/DRESSINGS) ×4 IMPLANT
DRSG WOUND ADHESIVE SYLKE 010 (GAUZE/BANDAGES/DRESSINGS) IMPLANT
ELECT BLADE 4.0 EZ CLEAN MEGAD (MISCELLANEOUS) ×4 IMPLANT
ELECT CAUTERY BLADE 6.4 (BLADE) ×2 IMPLANT
ELECT NDL TIP 2.8 STRL (NEEDLE) IMPLANT
ELECT NEEDLE TIP 2.8 STRL (NEEDLE) IMPLANT
ELECT REM PT RETURN 9FT ADLT (ELECTROSURGICAL) ×2 IMPLANT
ELECTRODE BLDE 4.0 EZ CLN MEGD (MISCELLANEOUS) ×4 IMPLANT
ELECTRODE REM PT RTRN 9FT ADLT (ELECTROSURGICAL) ×2 IMPLANT
EVACUATOR SILICONE 100CC (DRAIN) IMPLANT
EXTRACTOR CANIST REVOLVE STRL (CANNISTER) IMPLANT
GAUZE 4X4 16PLY ~~LOC~~+RFID DBL (SPONGE) ×2 IMPLANT
GAUZE PAD ABD 8X10 STRL (GAUZE/BANDAGES/DRESSINGS) ×4 IMPLANT
GAUZE SPONGE 4X4 12PLY STRL (GAUZE/BANDAGES/DRESSINGS) ×2 IMPLANT
GAUZE XEROFORM 5X9 LF (GAUZE/BANDAGES/DRESSINGS) IMPLANT
GLOVE BIO SURGEON STRL SZ 6.5 (GLOVE) ×4 IMPLANT
GLOVE BIOGEL M 6.5 STRL (GLOVE) ×8 IMPLANT
GLOVE SURG ENC TEXT LTX SZ7.5 (GLOVE) IMPLANT
GOWN STRL REUS W/ TWL LRG LVL3 (GOWN DISPOSABLE) ×6 IMPLANT
HEMOSTAT ARISTA ABSORB 3G PWDR (HEMOSTASIS) IMPLANT
KIT BASIN OR (CUSTOM PROCEDURE TRAY) ×2 IMPLANT
KIT TURNOVER KIT B (KITS) ×2 IMPLANT
MARKER SKIN DUAL TIP RULER LAB (MISCELLANEOUS) ×2 IMPLANT
NDL HYPO 25GX1X1/2 BEV (NEEDLE) ×2 IMPLANT
NDL SPNL 18GX3.5 QUINCKE PK (NEEDLE) IMPLANT
NEEDLE HYPO 25GX1X1/2 BEV (NEEDLE) ×2 IMPLANT
NEEDLE SPNL 18GX3.5 QUINCKE PK (NEEDLE) IMPLANT
NS IRRIG 1000ML POUR BTL (IV SOLUTION) ×2 IMPLANT
PACK GENERAL/GYN (CUSTOM PROCEDURE TRAY) ×2 IMPLANT
PAD ARMBOARD 7.5X6 YLW CONV (MISCELLANEOUS) ×2 IMPLANT
PAD FOAM SILICONE BACKED (GAUZE/BANDAGES/DRESSINGS) IMPLANT
PENCIL BUTTON HOLSTER BLD 10FT (ELECTRODE) ×2 IMPLANT
PENCIL SMOKE EVACUATOR (MISCELLANEOUS) ×2 IMPLANT
SPIKE FLUID TRANSFER (MISCELLANEOUS) IMPLANT
SPONGE T-LAP 18X18 ~~LOC~~+RFID (SPONGE) ×4 IMPLANT
STAPLER VISISTAT 35W (STAPLE) IMPLANT
STRIP CLOSURE SKIN 1/2X4 (GAUZE/BANDAGES/DRESSINGS) ×4 IMPLANT
SUT MNCRL AB 3-0 PS2 27 (SUTURE) IMPLANT
SUT MON AB 3-0 SH27 (SUTURE) IMPLANT
SUT MON AB 4-0 PS1 27 (SUTURE) IMPLANT
SUT MON AB 5-0 PS2 18 (SUTURE) IMPLANT
SUT PDS AB 2-0 CT2 27 (SUTURE) IMPLANT
SUT PDS AB 3-0 SH 27 (SUTURE) IMPLANT
SUT SILK 3 0 PS 1 (SUTURE) IMPLANT
SUT SILK 4 0 PS 2 (SUTURE) IMPLANT
SUT VIC AB 3-0 SH 27X BRD (SUTURE) IMPLANT
SUT VICRYL 4-0 PS2 18IN ABS (SUTURE) IMPLANT
SYR 10ML LL (SYRINGE) ×10 IMPLANT
SYR 50ML LL SCALE MARK (SYRINGE) IMPLANT
SYR BULB EAR ULCER 3OZ GRN STR (SYRINGE) IMPLANT
SYR CONTROL 10ML LL (SYRINGE) ×2 IMPLANT
TOWEL GREEN STERILE (TOWEL DISPOSABLE) ×2 IMPLANT
TOWEL GREEN STERILE FF (TOWEL DISPOSABLE) ×4 IMPLANT
TUBING INFILTRATION IT-10001 (TUBING) ×2 IMPLANT
YANKAUER SUCT BULB TIP NO VENT (SUCTIONS) IMPLANT

## 2023-05-19 NOTE — Anesthesia Procedure Notes (Signed)
 Procedure Name: Intubation Date/Time: 05/19/2023 7:55 AM  Performed by: Ovidio Kin, CRNAPre-anesthesia Checklist: Patient identified, Emergency Drugs available, Suction available and Patient being monitored Patient Re-evaluated:Patient Re-evaluated prior to induction Oxygen Delivery Method: Circle system utilized Preoxygenation: Pre-oxygenation with 100% oxygen Induction Type: IV induction Ventilation: Mask ventilation without difficulty Laryngoscope Size: Mac and 3 Grade View: Grade I Tube type: Oral Number of attempts: 1 Airway Equipment and Method: Stylet Placement Confirmation: ETT inserted through vocal cords under direct vision, positive ETCO2, CO2 detector and breath sounds checked- equal and bilateral Secured at: 21 cm Tube secured with: Tape Dental Injury: Teeth and Oropharynx as per pre-operative assessment

## 2023-05-19 NOTE — Op Note (Signed)
 Op note:    DATE OF PROCEDURE: 05/19/2023  LOCATION: [facility]  SURGEON: Foster Simpson, DO  ASSISTANT: [name], PA  PREOPERATIVE DIAGNOSIS 1. Breast asymmetry after treatment for breast cancer 2. Acquired absence of right breast  POSTOPERATIVE DIAGNOSIS Same as preoperative diagnosis  PROCEDURES 1. Right breast excision of skin and soft tissue 2 x 3 cm medially 2. Excision of lateral right breast 2 x 6 cm skin, soft tissue and adipose 3. Liposuction of lateral right breast 4. Right breast fat grafting 110 cc 5. Revision left breast reduction  COMPLICATIONS: None.  DRAINS: none  INDICATIONS FOR PROCEDURE Gabrielle Davidson is a 73 y.o. year-old female born on 1951/01/20,with a history of right breast cancer.  She underwent a right mastectomy with reconstruction.  She presents for revision due to breast asymmetry.    MRN: [id number]  CONSENT Informed consent was obtained directly from the patient. The risks, benefits and alternatives were fully discussed. Specific risks including but not limited to bleeding, infection, hematoma, seroma, scarring, pain, nipple necrosis, asymmetry, poor cosmetic results, and need for further surgery were discussed. The patient's questions were answered.  DESCRIPTION OF PROCEDURE  Patient was brought into the operating room and rested on the operating room table in the supine position.  SCDs were placed and appropriate padding was performed.  Antibiotics were given. The patient underwent general anesthesia and the chest and abdomen were prepped and draped in a sterile fashion.  A timeout was performed and all information was confirmed to be correct by those in the room. Tumescent was placed in the abdominal fat layers.   The Revolve was used to collect the adipose graft from the abdomen.  The fat was prepared according to the manufacture guidelines.  We were able to collect around 110 cc of adipose.  Right  side: Preoperative markings were confirmed.  Incision lines were injected with local containing epinephrine.  After waiting for vasoconstriction, the marked lines were incised with a #15 blade.  The medial aspect of the breast had excess tissue and adipose.  This was excised with the #15 blade and then the bovie.  An area of 2 x 3 cm was excised.  The deep layer was closed with the 3-0 PDS followed by 4-0 Monocryl.  Through this incision the adipose harvested was injected into the breast at the central, medial, superior and inferior aspect.  A total of 110 cc was injected. An area of 2 x 6 cm of the lateral breast had excess soft tissue and skin.  This was excised with the #15 blade and then the bovie.  The deep layers were closed with the 3-0 PDS and then the 4-0 Monocryl was used to close the skin.  Liposuction was done laterally to improve the contour of the breast.   Left side: Preoperative markings were confirmed.  Incision lines were injected with local containing epinephrine.  After waiting for vasoconstriction, the marked lines were incised with a #15 blade.  The previous incision line at the areola was utilized.  The periareolar incision was made circularly to raise the NAC 5 mm.  The peri-areola was de-epithelialized.  The deep layers were closed with the 3-0 Monocryl followed by a 4-0 Monocryl.  The vertical limb was then excised in a lollipop type incision.  The skin was excised and a portion of adipose.  This was then re-approximated using the 3-0 PDS and then the 3-0 Monocryl.  Liposuction was done laterally to improve the  contour.  The patient was sat upright and size and shape symmetry was confirmed.  All abdominal incisions were closed with the 4-0 Monocryl. The Sykle was applied to the incision sites. A breast binder and ABDs were placed.  The nipple and skin flaps had good capillary refill at the end of the procedure on the left breast.  The patient tolerated the procedure well. The patient was  allowed to wake from anesthesia and taken to the recovery room in satisfactory condition.  The advanced practice practitioner (APP) assisted throughout the case.  The APP was essential in retraction and counter traction when needed to make the case progress smoothly.  This retraction and assistance made it possible to see the tissue plans for the procedure.  The assistance was needed for blood control, tissue re-approximation and assisted with closure of the incision site.

## 2023-05-19 NOTE — Interval H&P Note (Signed)
 History and Physical Interval Note:  05/19/2023 7:06 AM  Gabrielle Davidson  has presented today for surgery, with the diagnosis of history of breast reconstruction.  The various methods of treatment have been discussed with the patient and family. After consideration of risks, benefits and other options for treatment, the patient has consented to  Procedure(s): Excision of excess right breast tissue with fat grafting and left breast reduction for symmetry (Right) LIPOSUCTION, WITH FAT TRANSFER (Right) BREAST REDUCTION WITH LIPOSUCTION (Left) as a surgical intervention.  The patient's history has been reviewed, patient examined, no change in status, stable for surgery.  I have reviewed the patient's chart and labs.  Questions were answered to the patient's satisfaction.     Alena Bills Oshua Mcconaha

## 2023-05-19 NOTE — Transfer of Care (Signed)
 Immediate Anesthesia Transfer of Care Note  Patient: Gabrielle Davidson  Procedure(s) Performed: Excision of excess right breast tissue with fat grafting and left breast reduction for symmetry (Right: Breast) LIPOSUCTION, WITH FAT TRANSFER (Right: Breast) BREAST REDUCTION WITH LIPOSUCTION (Left: Breast)  Patient Location: PACU  Anesthesia Type:General  Level of Consciousness: awake, alert , and oriented  Airway & Oxygen Therapy: Patient Spontanous Breathing and Patient connected to face mask oxygen  Post-op Assessment: Report given to RN and Post -op Vital signs reviewed and stable  Post vital signs: Reviewed and stable  Last Vitals:  Vitals Value Taken Time  BP 118/74 05/19/23 0945  Temp    Pulse 74 05/19/23 0948  Resp 18 05/19/23 0948  SpO2 100 % 05/19/23 0948  Vitals shown include unfiled device data.  Last Pain:  Vitals:   05/19/23 0615  TempSrc:   PainSc: 0-No pain         Complications: No notable events documented.

## 2023-05-19 NOTE — Discharge Instructions (Signed)

## 2023-05-20 ENCOUNTER — Encounter (HOSPITAL_COMMUNITY): Payer: Self-pay | Admitting: Plastic Surgery

## 2023-05-20 ENCOUNTER — Ambulatory Visit: Admitting: Physician Assistant

## 2023-05-20 DIAGNOSIS — Z9889 Other specified postprocedural states: Secondary | ICD-10-CM

## 2023-05-20 NOTE — Progress Notes (Signed)
 Patient is a pleasant 73 year old female with history of right breast cancer with reconstruction with left breast reduction and right breast fat grafting now s/p revision left breast oncoplastic reduction for symmetry and right breast fat grafting performed 05/19/2023 by Dr. Ulice Bold who joins via telephone for postoperative day 1 check-in.  Reviewed operative report and left vertical limb was excised and reclosed.  Liposuction performed out laterally for improved contour.  Right breast had excess tissue medially that was excised and closed.  110 cc adipose harvested from abdomen was grafted to right breast.  Liposuction performed bilaterally.  Today, patient is doing well.  She is tolerating her pain symptoms with Tylenol and ibuprofen alone.  She does endorse some discomfort which is not surprising given the multiple areas of liposuction.  However, overall she is doing well.  Denies any fevers, chest pain, difficulty breathing, or leg swelling.  She is tolerating p.o. intake without difficulty.  Voiding urine and BM.  Ambulatory and "puttering" around the home.  She is maintaining 24/7 compressive garments with abdominal and breast binder.  Plans to shower on Sunday which is reasonable.  Patient to follow-up next week as scheduled, sooner if needed.  She understands to call the office should she have any questions or concerns over the weekend.

## 2023-05-20 NOTE — Anesthesia Postprocedure Evaluation (Signed)
 Anesthesia Post Note  Patient: Gabrielle Davidson  Procedure(s) Performed: Excision of excess right breast tissue with fat grafting and left breast reduction for symmetry (Right: Breast) LIPOSUCTION, WITH FAT TRANSFER (Right: Breast) BREAST REDUCTION WITH LIPOSUCTION (Left: Breast)     Patient location during evaluation: PACU Anesthesia Type: General Level of consciousness: awake and alert Pain management: pain level controlled Vital Signs Assessment: post-procedure vital signs reviewed and stable Respiratory status: spontaneous breathing, nonlabored ventilation, respiratory function stable and patient connected to nasal cannula oxygen Cardiovascular status: blood pressure returned to baseline and stable Postop Assessment: no apparent nausea or vomiting Anesthetic complications: no  No notable events documented.  Last Vitals:  Vitals:   05/19/23 1100 05/19/23 1115  BP: 131/88 123/67  Pulse: 66 73  Resp: (!) 21 19  Temp: (!) 36.3 C (!) 36.3 C  SpO2: 100% 93%    Last Pain:  Vitals:   05/19/23 0615  TempSrc:   PainSc: 0-No pain                 [name] L Griselda Tosh

## 2023-05-23 ENCOUNTER — Encounter: Payer: Self-pay | Admitting: Student

## 2023-05-23 ENCOUNTER — Ambulatory Visit (INDEPENDENT_AMBULATORY_CARE_PROVIDER_SITE_OTHER): Payer: Medicare HMO | Admitting: Student

## 2023-05-23 VITALS — BP 130/86 | HR 69 | Ht <= 58 in | Wt 147.0 lb

## 2023-05-23 DIAGNOSIS — Z9889 Other specified postprocedural states: Secondary | ICD-10-CM

## 2023-05-23 DIAGNOSIS — N651 Disproportion of reconstructed breast: Secondary | ICD-10-CM

## 2023-05-23 NOTE — Discharge Instructions (Signed)

## 2023-05-23 NOTE — Progress Notes (Signed)
 Patient is a 73 year old female with history of right breast cancer status post right breast reconstruction.  She most recently underwent excision of right breast tissue, excision of lateral right breast tissue, liposuction of the right lateral breast, right breast fat grafting and revision of left breast reduction with Dr. Ulice Bold on 05/19/2023.  She is about 4 days postop.  She presents to the clinic today for postoperative follow-up.  Today, patient reports she is doing well.  She states that her pain has been controlled with Tylenol and ibuprofen.  She denies any nausea or vomiting.  Denies any fevers or chills.  Reports that the TopiFoam pads feel bulky underneath the binders.  Denies any other issues or concerns at this time.  Chaperone present on exam.  On exam, patient is sitting upright in no acute distress.  Abdomen is soft and nontender.  There are no obvious fluid collections on exam.  There is a little bit overlying ecchymosis.  There is no overlying erythema.  Incisions to the lower abdomen appear to be intact with Sylke strips.  Breasts are soft bilaterally.  There is a little bit of ecchymosis noted bilaterally.  There is no overlying erythema.  No obvious fluid collections palpated on exam.  Left NAC appears to be healthy.  Incisions appear to be intact with Sylke strips.  There are no signs of infection on exam.  Recommended that the patient continue to wear the lipo foam to her abdomen and to the lateral breasts until at least 2 weeks postop.  Discussed with her that she may transition into a sports bra that subsequence in the front and to compressive spanks if she would like.  Patient expressed understanding.  Patient to follow back up at her next scheduled appointment in 2 weeks.  Instructed her to call if she has any questions or concerns about anything.

## 2023-05-25 ENCOUNTER — Encounter: Payer: Self-pay | Admitting: Ophthalmology

## 2023-05-25 ENCOUNTER — Ambulatory Visit: Payer: Self-pay | Admitting: Anesthesiology

## 2023-05-25 ENCOUNTER — Encounter: Payer: Medicare HMO | Admitting: Student

## 2023-05-25 ENCOUNTER — Other Ambulatory Visit: Payer: Self-pay

## 2023-05-25 ENCOUNTER — Encounter
Admission: RE | Disposition: A | Discharge status: Home / Self Care | Payer: Self-pay | Source: Home / Self Care | Attending: Ophthalmology

## 2023-05-25 ENCOUNTER — Ambulatory Visit
Admission: RE | Admit: 2023-05-25 | Discharge: 2023-05-25 | Disposition: A | Discharge status: Home / Self Care | Payer: Medicare HMO | Attending: Ophthalmology | Admitting: Ophthalmology

## 2023-05-25 DIAGNOSIS — I10 Essential (primary) hypertension: Secondary | ICD-10-CM | POA: Diagnosis not present

## 2023-05-25 DIAGNOSIS — Z87891 Personal history of nicotine dependence: Secondary | ICD-10-CM | POA: Diagnosis not present

## 2023-05-25 DIAGNOSIS — H2512 Age-related nuclear cataract, left eye: Secondary | ICD-10-CM | POA: Insufficient documentation

## 2023-05-25 DIAGNOSIS — K759 Inflammatory liver disease, unspecified: Secondary | ICD-10-CM | POA: Diagnosis not present

## 2023-05-25 HISTORY — DX: Nonrheumatic pulmonary valve insufficiency: I37.1

## 2023-05-25 HISTORY — PX: CATARACT EXTRACTION W/PHACO: SHX586

## 2023-05-25 HISTORY — DX: Nonrheumatic mitral (valve) insufficiency: I34.0

## 2023-05-25 HISTORY — DX: Rheumatic tricuspid insufficiency: I07.1

## 2023-05-25 SURGERY — PHACOEMULSIFICATION, CATARACT, WITH IOL INSERTION
Anesthesia: Monitor Anesthesia Care | Site: Eye | Laterality: Left

## 2023-05-25 MED ORDER — NEOMYCIN-POLYMYXIN-DEXAMETH 3.5-10000-0.1 OP OINT
TOPICAL_OINTMENT | OPHTHALMIC | Status: DC | PRN
  Administered 2023-05-25: 1 via OPHTHALMIC

## 2023-05-25 MED ORDER — SIGHTPATH DOSE#1 BSS IO SOLN
INTRAOCULAR | Status: DC | PRN
  Administered 2023-05-25: 61 mL via OPHTHALMIC

## 2023-05-25 MED ORDER — ARMC OPHTHALMIC DILATING DROPS
OPHTHALMIC | Status: AC
  Filled 2023-05-25: qty 0.5

## 2023-05-25 MED ORDER — TETRACAINE HCL 0.5 % OP SOLN
1.0000 [drp] | OPHTHALMIC | Status: DC | PRN
  Administered 2023-05-25 (×3): 1 [drp] via OPHTHALMIC

## 2023-05-25 MED ORDER — FENTANYL CITRATE (PF) 100 MCG/2ML IJ SOLN
INTRAMUSCULAR | Status: DC | PRN
  Administered 2023-05-25 (×2): 50 ug via INTRAVENOUS

## 2023-05-25 MED ORDER — TETRACAINE HCL 0.5 % OP SOLN
OPHTHALMIC | Status: AC
Start: 2023-05-25 — End: ?
  Filled 2023-05-25: qty 4

## 2023-05-25 MED ORDER — FENTANYL CITRATE (PF) 100 MCG/2ML IJ SOLN
INTRAMUSCULAR | Status: AC
  Filled 2023-05-25: qty 2

## 2023-05-25 MED ORDER — SIGHTPATH DOSE#1 BSS IO SOLN
INTRAOCULAR | Status: DC | PRN
  Administered 2023-05-25: 15 mL

## 2023-05-25 MED ORDER — SIGHTPATH DOSE#1 NA HYALUR & NA CHOND-NA HYALUR IO KIT
PACK | INTRAOCULAR | Status: DC | PRN
  Administered 2023-05-25: 1 via OPHTHALMIC

## 2023-05-25 MED ORDER — MIDAZOLAM HCL 2 MG/2ML IJ SOLN
INTRAMUSCULAR | Status: DC | PRN
Start: 2023-05-25 — End: 2023-05-25
  Administered 2023-05-25: 2 mg via INTRAVENOUS
  Administered 2023-05-25: 1 mg via INTRAVENOUS

## 2023-05-25 MED ORDER — ARMC OPHTHALMIC DILATING DROPS
1.0000 | OPHTHALMIC | Status: DC | PRN
  Administered 2023-05-25 (×3): 1 via OPHTHALMIC

## 2023-05-25 MED ORDER — LIDOCAINE HCL (PF) 2 % IJ SOLN
INTRAOCULAR | Status: DC | PRN
  Administered 2023-05-25: 1 mL via INTRAMUSCULAR

## 2023-05-25 MED ORDER — BRIMONIDINE TARTRATE-TIMOLOL 0.2-0.5 % OP SOLN
OPHTHALMIC | Status: DC | PRN
  Administered 2023-05-25: 1 [drp] via OPHTHALMIC

## 2023-05-25 MED ORDER — CEFUROXIME OPHTHALMIC INJECTION 1 MG/0.1 ML
INJECTION | OPHTHALMIC | Status: DC | PRN
  Administered 2023-05-25: .1 mL via INTRACAMERAL

## 2023-05-25 MED ORDER — MIDAZOLAM HCL 2 MG/2ML IJ SOLN
INTRAMUSCULAR | Status: AC
Start: 2023-05-25 — End: ?
  Filled 2023-05-25: qty 2

## 2023-05-25 MED ORDER — MIDAZOLAM HCL 2 MG/2ML IJ SOLN
INTRAMUSCULAR | Status: AC
  Filled 2023-05-25: qty 2

## 2023-05-25 SURGICAL SUPPLY — 10 items
CATARACT SUITE SIGHTPATH (MISCELLANEOUS) ×1 IMPLANT
FEE CATARACT SUITE SIGHTPATH (MISCELLANEOUS) ×1 IMPLANT
GLOVE BIOGEL PI IND STRL 8 (GLOVE) ×1 IMPLANT
GLOVE SURG LX STRL 7.5 STRW (GLOVE) ×1 IMPLANT
GLOVE SURG PROTEXIS BL SZ6.5 (GLOVE) ×1 IMPLANT
GLOVE SURG SYN 6.5 PF PI BL (GLOVE) ×1 IMPLANT
LENS IOL TECNIS EYHANCE 16.0 (Intraocular Lens) IMPLANT
NDL FILTER BLUNT 18X1 1/2 (NEEDLE) ×1 IMPLANT
NEEDLE FILTER BLUNT 18X1 1/2 (NEEDLE) ×1 IMPLANT
SYR 3ML LL SCALE MARK (SYRINGE) ×1 IMPLANT

## 2023-05-25 NOTE — Anesthesia Preprocedure Evaluation (Addendum)
 Anesthesia Evaluation  Patient identified by MRN, date of birth, ID band Patient awake    Reviewed: Allergy & Precautions, H&P , NPO status , Patient's Chart, lab work & pertinent test results  Airway Mallampati: I  TM Distance: >3 FB Neck ROM: Full    Dental no notable dental hx.    Pulmonary neg pulmonary ROS, shortness of breath, pneumonia, former smoker   Pulmonary exam normal breath sounds clear to auscultation       Cardiovascular hypertension, negative cardio ROS Normal cardiovascular exam Rhythm:Regular Rate:Normal    Exercise Myocardial Perfusion Study 02/27/18 ([facility]): IMPRESSION: Negative ETT with fair exercise tolerance.  LV function normal.  No  evidence of reversible ischemia.  Low risk study.    Echo 06/28/17 ([facility]): INTERPRETATION  NORMAL LEFT VENTRICULAR SYSTOLIC FUNCTION  NORMAL RIGHT VENTRICULAR SYSTOLIC FUNCTION  MILD VALVULAR REGURGITATION (mild MR, mild TR, mild PR)  NO VALVULAR STENOSIS     Neuro/Psych  PSYCHIATRIC DISORDERS       Neuromuscular disease negative neurological ROS  negative psych ROS   GI/Hepatic negative GI ROS, Neg liver ROS,,,(+) Hepatitis -  Endo/Other  negative endocrine ROSHypothyroidism    Renal/GU negative Renal ROS  negative genitourinary   Musculoskeletal negative musculoskeletal ROS (+)    Abdominal   Peds negative pediatric ROS (+)  Hematology negative hematology ROS (+)   Anesthesia Other Findings   Medical History  Hypertension  Hepatitis Hypothyroidism  Thyroid cancer (HCC) Breast cancer (HCC)  Osteopenia of multiple sites Hypercholesteremia  Statin-induced myositis Pneumonia  Dyspnea Wears contact lenses     Reproductive/Obstetrics negative OB ROS                              Anesthesia Physical Anesthesia Plan  ASA: 2  Anesthesia Plan: MAC   Post-op Pain Management:    Induction: Intravenous  PONV Risk  Score and Plan:   Airway Management Planned: Natural Airway and Nasal Cannula  Additional Equipment:   Intra-op Plan:   Post-operative Plan:   Informed Consent: I have reviewed the patients History and Physical, chart, labs and discussed the procedure including the risks, benefits and alternatives for the proposed anesthesia with the patient or authorized representative who has indicated his/her understanding and acceptance.     Dental Advisory Given  Plan Discussed with: Anesthesiologist, CRNA and Surgeon  Anesthesia Plan Comments: (Patient consented for risks of anesthesia including but not limited to:  - adverse reactions to medications - damage to eyes, teeth, lips or other oral mucosa - nerve damage due to positioning  - sore throat or hoarseness - Damage to heart, brain, nerves, lungs, other parts of body or loss of life  Patient voiced understanding and assent.)        Anesthesia Quick Evaluation

## 2023-05-25 NOTE — Transfer of Care (Signed)
 Immediate Anesthesia Transfer of Care Note  Patient: Gabrielle Davidson  Procedure(s) Performed: CATARACT EXTRACTION PHACO AND INTRAOCULAR LENS PLACEMENT (IOC) LEFT 7.59 00:45.6 (Left: Eye)  Patient Location: PACU  Anesthesia Type: MAC  Level of Consciousness: awake, alert  and patient cooperative  Airway and Oxygen Therapy: Patient Spontanous Breathing and Patient connected to supplemental oxygen  Post-op Assessment: Post-op Vital signs reviewed, Patient's Cardiovascular Status Stable, Respiratory Function Stable, Patent Airway and No signs of Nausea or vomiting  Post-op Vital Signs: Reviewed and stable  Complications: No notable events documented.

## 2023-05-25 NOTE — Op Note (Signed)
 OPERATIVE NOTE  Gabrielle Davidson [id number] 05/25/2023   PREOPERATIVE DIAGNOSIS:  Nuclear sclerotic cataract left eye. H25.12   POSTOPERATIVE DIAGNOSIS:    Nuclear sclerotic cataract left eye.     PROCEDURE:  Phacoemusification with posterior chamber intraocular lens placement of the left eye  Ultrasound time: Procedure(s): CATARACT EXTRACTION PHACO AND INTRAOCULAR LENS PLACEMENT (IOC) LEFT 7.59 00:45.6 (Left)  LENS:   Implant Name Type Inv. Item Serial No. Manufacturer Lot No. LRB No. Used Action  LENS IOL TECNIS EYHANCE 16.0 - [id number] Intraocular Lens LENS IOL TECNIS EYHANCE 16.0 [id number] [employer]  Left 1 Implanted      SURGEON:  Deirdre Evener, MD   ANESTHESIA:  Topical with tetracaine drops and 2% Xylocaine jelly, augmented with 1% preservative-free intracameral lidocaine.    COMPLICATIONS:  None.   DESCRIPTION OF PROCEDURE:  The patient was identified in the holding room and transported to the operating room and placed in the supine position under the operating microscope.  The left eye was identified as the operative eye and it was prepped and draped in the usual sterile ophthalmic fashion.   A 1 millimeter clear-corneal paracentesis was made at the 1:30 position.  0.5 ml of preservative-free 1% lidocaine was injected into the anterior chamber.  The anterior chamber was filled with Viscoat viscoelastic.  A 2.4 millimeter keratome was used to make a near-clear corneal incision at the 10:30 position.  .  A curvilinear capsulorrhexis was made with a cystotome and capsulorrhexis forceps.  Balanced salt solution was used to hydrodissect and hydrodelineate the nucleus.   Phacoemulsification was then used in stop and chop fashion to remove the lens nucleus and epinucleus.  The remaining cortex was then removed using the irrigation and aspiration handpiece. Provisc was then placed into the capsular bag to distend it for lens placement.  A lens was then injected into the  capsular bag.  The remaining viscoelastic was aspirated.   Wounds were hydrated with balanced salt solution.  The anterior chamber was inflated to a physiologic pressure with balanced salt solution.  No wound leaks were noted. Cefuroxime 0.1 ml of a 10mg /ml solution was injected into the anterior chamber for a dose of 1 mg of intracameral antibiotic at the completion of the case.   Timolol and Brimonidine drops were applied to the eye.  The patient was taken to the recovery room in stable condition without complications of anesthesia or surgery.  Gabrielle Davidson 05/25/2023, 12:58 PM

## 2023-05-25 NOTE — H&P (Signed)
 [facility]   Primary Care Physician:  Marguarite Arbour, MD Ophthalmologist: Dr. Lockie Mola  Pre-Procedure History & Physical: HPI:  Gabrielle Davidson is a 73 y.o. female here for ophthalmic surgery.   Past Medical History:  Diagnosis Date   Breast cancer (HCC)    Dyspnea    Family history of breast cancer    Family history of colon cancer    Hepatitis    A-YEARS AGO   Hypercholesteremia    Hypertension    Hypothyroidism    Osteopenia of multiple sites    Pneumonia    Statin-induced myositis    Thyroid cancer (HCC)    Wears contact lenses     Past Surgical History:  Procedure Laterality Date   BREAST BIOPSY Right 02/10/2021   [place] bx, heart marker,  INVASIVE MAMMARY CARCINOMA   BREAST BIOPSY Right 02/18/2021   stereo bx, distortion "COIL" clip-BENIGN MAMMARY PARENCHYMA WITH FOCAL PSEUDO   BREAST BIOPSY Left 01/31/2023   path pending/ ribbon clip   BREAST BIOPSY Left 01/31/2023   MM LT BREAST BX W LOC DEV 1ST LESION IMAGE BX SPEC STEREO GUIDE 01/31/2023 [facility]   BREAST CYST EXCISION Right 10/07/2022   Procedure: EXCISION OF RIGHT LATERAL BREAST TISSUE;  Surgeon: Peggye Form, DO;  Location: [facility];  Service: Plastics;  Laterality: Right;   BREAST RECONSTRUCTION WITH PLACEMENT OF TISSUE EXPANDER AND FLEX HD (ACELLULAR HYDRATED DERMIS) Right 04/08/2021   Procedure: RIGHT BREAST RECONSTRUCTION WITH PLACEMENT OF TISSUE EXPANDER AND FLEX HD (ACELLULAR HYDRATED DERMIS);  Surgeon: Peggye Form, DO;  Location: [facility];  Service: Plastics;  Laterality: Right;   BREAST REDUCTION SURGERY Left 06/25/2021   Procedure: MAMMARY REDUCTION  (BREAST) LEFT BREAST;  Surgeon: Peggye Form, DO;  Location: [facility];  Service: Plastics;  Laterality: Left;   BREAST REDUCTION SURGERY Left 05/19/2023   Procedure: BREAST REDUCTION WITH LIPOSUCTION;  Surgeon: Peggye Form, DO;  Location: [facility];  Service:  Plastics;  Laterality: Left;   CATARACT EXTRACTION W/PHACO Right 05/16/2023   Procedure: CATARACT EXTRACTION PHACO AND INTRAOCULAR LENS PLACEMENT (IOC) RIGHT EYHANCE TORIC 9.59 00:36.7;  Surgeon: Lockie Mola, MD;  Location: [facility];  Service: Ophthalmology;  Laterality: Right;   COLONOSCOPY W/ POLYPECTOMY     LIPOSUCTION Right 10/07/2022   Procedure: LIPOSUCTION OF RIGHT LATERAL BREAST;  Surgeon: Peggye Form, DO;  Location: [facility];  Service: Plastics;  Laterality: Right;   LIPOSUCTION WITH LIPOFILLING Right 01/14/2022   Procedure: LIPOSUCTION WITH LIPOFILLING;  Surgeon: Peggye Form, DO;  Location: [facility];  Service: Plastics;  Laterality: Right;   LIPOSUCTION WITH LIPOFILLING Right 05/19/2023   Procedure: LIPOSUCTION, WITH FAT TRANSFER;  Surgeon: Peggye Form, DO;  Location: [facility];  Service: Plastics;  Laterality: Right;   LUMBAR LAMINECTOMY/ DECOMPRESSION WITH MET-RX Right 09/16/2020   Procedure: Right Lumbar five Sacral one Minimally  invasive Discectomy with Metrex;  Surgeon: Jadene Pierini, MD;  Location: [facility];  Service: Neurosurgery;  Laterality: Right;   MASTECTOMY Right    MASTECTOMY WITH AXILLARY LYMPH NODE DISSECTION Right 04/08/2021   Procedure: MASTECTOMY WITH AXILLARY LYMPH NODE DISSECTION;  Surgeon: Earline Mayotte, MD;  Location: [facility];  Service: General;  Laterality: Right;   MASTOPEXY Left 06/25/2021   Procedure: MASTOPEXY LEFT BREAST;  Surgeon: Peggye Form, DO;  Location: [facility];  Service: Plastics;  Laterality: Left;   RECONSTRUCTION OF ANGULAR DEFORMITY,TOE Right  10/15/2021   Procedure: 84166 - RECONSTRUCTION hammertoe OVERLAPPING TOE - SECOND;  Surgeon: Rosetta Posner, DPM;  Location: [facility];  Service: Podiatry;  Laterality: Right;   REDUCTION MAMMAPLASTY     REMOVAL OF BILATERAL TISSUE EXPANDERS WITH PLACEMENT OF BILATERAL BREAST IMPLANTS Right 06/25/2021    Procedure: REMOVAL OF RIGH TISSUE EXPANDERS WITH PLACEMENT OF RIGHT BREAST IMPLANTS;  Surgeon: Peggye Form, DO;  Location: [facility];  Service: Plastics;  Laterality: Right;  1.5 hours   THYROIDECTOMY Bilateral 03/26/2015   Procedure: THYROIDECTOMY;  Surgeon: Geanie Logan, MD;  Location: [facility];  Service: ENT;  Laterality: Bilateral;   UNILATERAL BREAST REDUCTION Left 10/07/2022   Procedure: Left breast reduction for symmetry;  Surgeon: Peggye Form, DO;  Location: [facility];  Service: Plastics;  Laterality: Left;   WEIL OSTEOTOMY Right 10/15/2021   Procedure: 06301 - WEIL OSTEOTOMY - SECOND;  Surgeon: Rosetta Posner, DPM;  Location: [facility];  Service: Podiatry;  Laterality: Right;  Anesthesia: Choice preop pop block   WISDOM TOOTH EXTRACTION      Prior to Admission medications   Medication Sig Start Date End Date Taking? Authorizing Provider  amitriptyline (ELAVIL) 10 MG tablet Take 20 mg by mouth at bedtime.   Yes [provider]  Ascorbic Acid (VITAMIN C) 1000 MG tablet Take 1,000 mg by mouth in the morning and at bedtime.   Yes [provider]  Biotin 60109 MCG TABS Take 10,000 mcg by mouth in the morning and at bedtime.   Yes [provider]  Calcium Carbonate-Vitamin D3 600-400 MG-UNIT TABS Take 1-2 tablets by mouth See admin instructions. 1 tablet in the morning and 2 tablets at bedtime   Yes [provider]  chlorthalidone (HYGROTON) 25 MG tablet Take 25 mg by mouth in the morning. 10/11/18  Yes [provider]  FLUoxetine (PROZAC) 40 MG capsule Take 40 mg by mouth every morning.    Yes [provider]  fluticasone (FLONASE) 50 MCG/ACT nasal spray Place 2 sprays into both nostrils daily as needed for allergies. 08/26/22  Yes [provider]  ibuprofen (ADVIL) 200 MG tablet Take 400-800 mg by mouth every 8 (eight) hours as needed for moderate pain (pain score 4-6).   Yes  [provider]  levothyroxine (SYNTHROID) 100 MCG tablet Take 100 mcg by mouth See admin instructions. Take 100 mcg by mouth Monday-Saturday. Do not take on Sundays. Take on an empty stomach with a glass of water at least 30-60 minutes before breakfast. 09/30/22  Yes [provider]  losartan (COZAAR) 100 MG tablet Take 100 mg by mouth in the morning. 04/13/18  Yes [provider]  potassium chloride (MICRO-K) 10 MEQ CR capsule Take 10 mEq by mouth 2 (two) times daily. 03/15/23 03/14/24 Yes [provider]  tiZANidine (ZANAFLEX) 4 MG tablet Take 1 tablet (4 mg total) by mouth every 8 (eight) hours as needed for muscle spasms. 09/17/20  Yes Jadene Pierini, MD  vitamin B-12 (CYANOCOBALAMIN) 1000 MCG tablet Take 1,000 mcg by mouth in the morning.   Yes [provider]  meclizine (ANTIVERT) 25 MG tablet Take 25 mg by mouth 3 (three) times daily as needed for dizziness.    [provider]  ondansetron (ZOFRAN) 4 MG tablet Take 1 tablet (4 mg total) by mouth every 8 (eight) hours as needed for nausea or vomiting. 10/15/21   Rosetta Posner, DPM  ondansetron (ZOFRAN) 4 MG tablet Take 1 tablet (4 mg  total) by mouth every 8 (eight) hours as needed for nausea or vomiting. Patient not taking: Reported on 05/23/2023 09/10/22   Scheeler, Kermit Balo, PA-C  oxyCODONE (ROXICODONE) 5 MG immediate release tablet Take 1 tablet (5 mg total) by mouth every 6 (six) hours as needed for up to 20 doses for severe pain (pain score 7-10). Patient not taking: Reported on 05/23/2023 04/27/23   Laurena Spies, PA-C    Allergies as of 04/26/2023   (No Known Allergies)    Family History  Problem Relation Age of Onset   Alzheimer's disease Mother    Hernia Mother    Heart disease Father    Colon cancer Maternal Aunt        d. under 39   Breast cancer Paternal Aunt 40   Heart attack Maternal Grandfather    Diabetes Paternal Grandmother    Breast cancer Cousin 31     Social History   Socioeconomic History   Marital status: Married    Spouse name: ,Link Snuffer   Number of children: Not on file   Years of education: Not on file   Highest education level: Not on file  Occupational History   Not on file  Tobacco Use   Smoking status: Former    Current packs/day: 0.00    Average packs/day: 1 pack/day for 20.0 years (20.0 ttl pk-yrs)    Types: Cigarettes    Start date: 03/21/1963    Quit date: 03/21/1983    Years since quitting: 40.2   Smokeless tobacco: Never  Vaping Use   Vaping status: Never Used  Substance and Sexual Activity   Alcohol use: Yes    Comment: social   Drug use: No   Sexual activity: Not Currently    Birth control/protection: Post-menopausal  Other Topics Concern   Not on file  Social History Narrative   Not on file   Social Drivers of Health   Financial Resource Strain: Low Risk  (03/15/2023)   Received from [facility]   Overall Financial Resource Strain (CARDIA)    Difficulty of Paying Living Expenses: Not hard at all  Food Insecurity: No Food Insecurity (03/15/2023)   Received from [facility]   Hunger Vital Sign    Worried About Running Out of Food in the Last Year: Never true    Ran Out of Food in the Last Year: Never true  Transportation Needs: No Transportation Needs (03/15/2023)   Received from [facility] - Transportation    In the past 12 months, has lack of transportation kept you from medical appointments or from getting medications?: No    Lack of Transportation (Non-Medical): No  Physical Activity: Not on file  Stress: Not on file  Social Connections: Not on file  Intimate Partner Violence: Unknown (06/16/2021)   [facility] from [facility]   HITS    Physically Hurt: Not on file    Insult or Talk Down To: Not on file    Threaten Physical Harm: Not on file    Scream or Curse: Not on file    Review of Systems: See HPI,  otherwise negative ROS  Physical Exam: BP 123/76   Temp 98.1 F (36.7 C) (Temporal)   Resp 17   Ht 4' 9.99" (1.473 m)   Wt 65.9 kg   SpO2 99%   BMI 30.36 kg/m  General:   Alert,  pleasant and cooperative in NAD Head:  Normocephalic and atraumatic. Lungs:  Clear to auscultation.    Heart:  Regular rate and rhythm.   Impression/Plan: Gabrielle Davidson is here for ophthalmic surgery.  Risks, benefits, limitations, and alternatives regarding ophthalmic surgery have been reviewed with the patient.  Questions have been answered.  All parties agreeable.   Lockie Mola, MD  05/25/2023, 11:48 AM

## 2023-05-25 NOTE — Anesthesia Postprocedure Evaluation (Signed)
 Anesthesia Post Note  Patient: Gabrielle Davidson  Procedure(s) Performed: CATARACT EXTRACTION PHACO AND INTRAOCULAR LENS PLACEMENT (IOC) LEFT 7.59 00:45.6 (Left: Eye)  Patient location during evaluation: PACU Anesthesia Type: MAC Level of consciousness: awake and alert Pain management: pain level controlled Vital Signs Assessment: post-procedure vital signs reviewed and stable Respiratory status: spontaneous breathing, nonlabored ventilation, respiratory function stable and patient connected to nasal cannula oxygen Cardiovascular status: stable and blood pressure returned to baseline Postop Assessment: no apparent nausea or vomiting Anesthetic complications: no   No notable events documented.   Last Vitals:  Vitals:   05/25/23 1259 05/25/23 1300  BP:    Pulse:  68  Resp:  11  Temp: 36.8 C   SpO2:  94%    Last Pain:  Vitals:   05/25/23 1259  TempSrc:   PainSc: 0-No pain                 Gabrielle Davidson C Estanislao Harmon

## 2023-05-26 ENCOUNTER — Encounter: Payer: Self-pay | Admitting: Ophthalmology

## 2023-05-27 ENCOUNTER — Ambulatory Visit: Payer: Medicare HMO | Admitting: Plastic Surgery

## 2023-06-07 ENCOUNTER — Encounter: Payer: Self-pay | Admitting: Plastic Surgery

## 2023-06-07 ENCOUNTER — Ambulatory Visit (INDEPENDENT_AMBULATORY_CARE_PROVIDER_SITE_OTHER): Payer: Medicare HMO | Admitting: Plastic Surgery

## 2023-06-07 VITALS — BP 110/69 | HR 67 | Ht <= 58 in | Wt 143.0 lb

## 2023-06-07 DIAGNOSIS — N651 Disproportion of reconstructed breast: Secondary | ICD-10-CM

## 2023-06-07 NOTE — Progress Notes (Signed)
 The patient is a 73 year old female here for follow-up.  She had surgery on March 6.  She had fat grafting and revision of left breast reduction.  Overall she looks really good and has great symmetry.  There is no sign of a hematoma or seroma.  She is really pleased with her right lateral breast area as well.  She should continue with the binder or sports bra.  Will plan to see her back in the next few weeks.  Pictures were obtained of the patient and placed in the chart with the patient's or guardian's permission.

## 2023-06-21 ENCOUNTER — Ambulatory Visit: Payer: Medicare HMO | Admitting: Student

## 2023-06-21 ENCOUNTER — Encounter: Payer: Medicare HMO | Admitting: Student

## 2023-06-21 DIAGNOSIS — N651 Disproportion of reconstructed breast: Secondary | ICD-10-CM

## 2023-06-21 NOTE — Progress Notes (Signed)
 Patient is a 73 year old female with history of right breast cancer status post right breast reconstruction. She most recently underwent excision of right breast tissue, excision of lateral right breast tissue, liposuction of the right lateral breast, right breast fat grafting and revision of left breast reduction with Dr. Ulice Bold on 05/19/2023.  She is a little over 4 weeks postop.  She presents to the clinic today for postoperative follow-up.  Patient was last seen in the clinic on 06/07/2023.  At this visit, it was noted that patient overall looked really good and had great symmetry.  There is no sign of hematoma or seroma.  Patient was really pleased with her right lateral breast area as well.  Today, patient is doing well.  She states that she still has a little bit of soreness on the sides of her breasts.  She reports that she is overall pleased with how her right breast is looking.  She did state though that she feels her NAC tattoo is not completely centered now.  She denies any fevers or chills.  She denies any other issues or concerns.  Chaperone present on exam.  On exam, patient is sitting upright in no acute distress.  Right breast is soft.  There is no overlying erythema, no fluid collections palpated on exam.  Incisions appear to have Monocryl sutures noted at the ends of the incisions with some mild surrounding irritation.  Monocryl sutures were removed without any difficulty, patient tolerated well.  Incisions otherwise appear to be intact and healing well.  There is a little bit of mild firmness noted to the medial aspect of the breast, consistent with fat necrosis versus scar tissue.  Left breast is soft.  There is no overlying erythema, no fluid collections palpated on exam.  Left NAC is healthy.  Incision is intact and healing well.  There was a suture noted at the inferior aspect of the vertical limb which was cut and removed.  Patient tolerated well.  There are no signs of infection on  exam.  Recommended that patient apply Vaseline to her incisions for the next 2 weeks.  Discussed with her that after 2 weeks, she may start applying scar creams or scar tapes.  Discussed with patient that if the areas of irritation do not improve she needs to reach out to [place].  Also recommended that she gently massage her right medial breast to the area of firmness.  Patient expressed understanding.  Discussed with patient that she will have to continue wearing her compression for another 2 weeks as well as avoid strenuous activities for another 2 weeks.  Patient expressed understanding.  Recommended that patient give her right breast a little bit more time to settle out.  Recommended that she follow back up with [place] in 3 months to reevaluate how she is feeling about her NAC tattoo and we will go from there if she is bothered by it.  Patient expressed understanding was in agreement.  In the meantime I instructed the patient to call if she has any questions or concerns about anything.  Pictures were obtained of the patient and placed in the chart with the patient's or guardian's permission.

## 2023-07-11 NOTE — Progress Notes (Unsigned)
 Patient is a pleasant 73 year old female with history of right breast cancer with implant-based reconstruction and contralateral reduction now s/p excision of right lateral breast tissue, revision left breast reduction, and fat grafting to right breast performed 05/19/2023 by [place] who presents to clinic for postoperative follow-up.  She was last seen in clinic on 06/21/2023.  At that time, her exam was entirely benign.  Her only concern was that her NAC tattoo was no longer centered.  Recommended Vaseline to her incisions throughout and follow-up in 2 weeks, sooner if needed.  Massage to the area of firmness to right medial breast.  Today,

## 2023-07-12 ENCOUNTER — Encounter: Payer: Self-pay | Admitting: Physician Assistant

## 2023-07-12 ENCOUNTER — Ambulatory Visit (INDEPENDENT_AMBULATORY_CARE_PROVIDER_SITE_OTHER): Admitting: Physician Assistant

## 2023-07-12 VITALS — BP 128/65 | HR 63

## 2023-07-12 DIAGNOSIS — N651 Disproportion of reconstructed breast: Secondary | ICD-10-CM

## 2023-07-12 DIAGNOSIS — C50919 Malignant neoplasm of unspecified site of unspecified female breast: Secondary | ICD-10-CM

## 2023-11-04 ENCOUNTER — Other Ambulatory Visit: Payer: Self-pay | Admitting: Internal Medicine

## 2023-11-04 DIAGNOSIS — E7800 Pure hypercholesterolemia, unspecified: Secondary | ICD-10-CM

## 2023-11-04 DIAGNOSIS — Z0000 Encounter for general adult medical examination without abnormal findings: Secondary | ICD-10-CM

## 2023-11-04 DIAGNOSIS — M609 Myositis, unspecified: Secondary | ICD-10-CM

## 2023-11-16 ENCOUNTER — Ambulatory Visit
Admission: RE | Admit: 2023-11-16 | Discharge: 2023-11-16 | Disposition: A | Discharge status: Home / Self Care | Payer: Self-pay | Source: Ambulatory Visit | Attending: Internal Medicine | Admitting: Internal Medicine

## 2023-11-16 DIAGNOSIS — T466X5A Adverse effect of antihyperlipidemic and antiarteriosclerotic drugs, initial encounter: Secondary | ICD-10-CM | POA: Insufficient documentation

## 2023-11-16 DIAGNOSIS — M609 Myositis, unspecified: Secondary | ICD-10-CM | POA: Insufficient documentation

## 2023-11-16 DIAGNOSIS — E7800 Pure hypercholesterolemia, unspecified: Secondary | ICD-10-CM | POA: Insufficient documentation

## 2023-11-16 DIAGNOSIS — Z0000 Encounter for general adult medical examination without abnormal findings: Secondary | ICD-10-CM | POA: Insufficient documentation

## 2023-12-20 ENCOUNTER — Other Ambulatory Visit: Payer: Self-pay | Admitting: Internal Medicine

## 2023-12-20 DIAGNOSIS — Z1231 Encounter for screening mammogram for malignant neoplasm of breast: Secondary | ICD-10-CM

## 2024-01-23 ENCOUNTER — Ambulatory Visit
Admission: RE | Admit: 2024-01-23 | Discharge: 2024-01-23 | Disposition: A | Discharge status: Home / Self Care | Source: Ambulatory Visit | Attending: Internal Medicine | Admitting: Internal Medicine

## 2024-01-23 DIAGNOSIS — Z1231 Encounter for screening mammogram for malignant neoplasm of breast: Secondary | ICD-10-CM | POA: Insufficient documentation

## 2024-03-19 ENCOUNTER — Ambulatory Visit: Payer: Medicare (Managed Care)

## 2024-03-19 DIAGNOSIS — K635 Polyp of colon: Secondary | ICD-10-CM | POA: Diagnosis not present

## 2024-03-19 DIAGNOSIS — Z860101 Personal history of adenomatous and serrated colon polyps: Secondary | ICD-10-CM | POA: Diagnosis not present

## 2024-03-19 DIAGNOSIS — Z09 Encounter for follow-up examination after completed treatment for conditions other than malignant neoplasm: Secondary | ICD-10-CM | POA: Diagnosis not present

## 2024-03-19 DIAGNOSIS — K640 First degree hemorrhoids: Secondary | ICD-10-CM | POA: Diagnosis not present

## 2024-04-03 ENCOUNTER — Other Ambulatory Visit: Payer: Self-pay

## 2024-04-03 ENCOUNTER — Emergency Department: Payer: Medicare (Managed Care)

## 2024-04-03 DIAGNOSIS — R051 Acute cough: Secondary | ICD-10-CM | POA: Diagnosis present

## 2024-04-03 DIAGNOSIS — J029 Acute pharyngitis, unspecified: Secondary | ICD-10-CM | POA: Diagnosis not present

## 2024-04-03 LAB — GROUP A STREP BY PCR: Group A Strep by PCR: NOT DETECTED

## 2024-04-03 NOTE — ED Triage Notes (Addendum)
 Pt to ED via EMS from home, pt reports cough congestion and throat burning x5 days. Pt reports body aches

## 2024-04-04 ENCOUNTER — Emergency Department
Admission: EM | Admit: 2024-04-04 | Discharge: 2024-04-04 | Disposition: A | Payer: Medicare (Managed Care) | Attending: Emergency Medicine | Admitting: Emergency Medicine

## 2024-04-04 DIAGNOSIS — J029 Acute pharyngitis, unspecified: Secondary | ICD-10-CM

## 2024-04-04 DIAGNOSIS — R051 Acute cough: Secondary | ICD-10-CM

## 2024-04-04 LAB — RESP PANEL BY RT-PCR (RSV, FLU A&B, COVID)  RVPGX2
Influenza A by PCR: NEGATIVE
Influenza B by PCR: NEGATIVE
Resp Syncytial Virus by PCR: NEGATIVE
SARS Coronavirus 2 by RT PCR: NEGATIVE

## 2024-04-04 MED ORDER — ALBUTEROL SULFATE HFA 108 (90 BASE) MCG/ACT IN AERS: 2.0000 | INHALATION_SPRAY | Freq: Four times a day (QID) | RESPIRATORY_TRACT | 2 refills | Status: AC | PRN

## 2024-04-04 MED ORDER — DEXAMETHASONE 1 MG/ML PO CONC
10.0000 mg | Freq: Once | ORAL | Status: AC
  Administered 2024-04-04: 10 mg via ORAL
  Filled 2024-04-04: qty 10

## 2024-04-04 MED ORDER — LIDOCAINE VISCOUS HCL 2 % MT SOLN
15.0000 mL | Freq: Once | OROMUCOSAL | Status: AC
  Administered 2024-04-04: 15 mL via ORAL
  Filled 2024-04-04: qty 15

## 2024-04-04 MED ORDER — GUAIFENESIN 200 MG PO TABS
200.0000 mg | ORAL_TABLET | Freq: Once | ORAL | Status: DC
  Filled 2024-04-04: qty 1

## 2024-04-04 MED ORDER — ALUM & MAG HYDROXIDE-SIMETH 200-200-20 MG/5ML PO SUSP
30.0000 mL | Freq: Once | ORAL | Status: AC
  Administered 2024-04-04: 30 mL via ORAL
  Filled 2024-04-04: qty 30

## 2024-04-04 MED ORDER — GUAIFENESIN 200 MG PO TABS: 200.0000 mg | ORAL_TABLET | Freq: Three times a day (TID) | ORAL | 0 refills | Status: AC | PRN

## 2024-04-04 NOTE — ED Provider Notes (Addendum)
 [facility] Provider Note    Event Date/Time   First MD Initiated Contact with Patient 04/04/24 [phone]     (approximate)   History   Cough   HPI  Marsia R Drozdowski is a 74 y.o. female with history of hyperlipidemia, here for 5 days of cough, sore throat.  No fever at home.  She denies any chest pain or shortness of breath.  States that she has been trying Lawyer at home without any improvement.  Denies any history of COPD or asthma.  She denies history of GERD.  On independent chart review, she was seen by primary care on 19 January for cough, was given an albuterol  inhaler for bronchospasms, Tessalon Perles as well as Zofran .    Physical Exam   Triage Vital Signs: ED Triage Vitals  Encounter Vitals Group     BP 04/03/24 2308 115/84     Girls Systolic BP Percentile --      Girls Diastolic BP Percentile --      Boys Systolic BP Percentile --      Boys Diastolic BP Percentile --      Pulse Rate 04/03/24 2308 74     Resp 04/03/24 2308 20     Temp 04/03/24 2308 98.8 F (37.1 C)     Temp src --      SpO2 04/03/24 2308 99 %     Weight 04/03/24 2306 144 lb (65.3 kg)     Height 04/03/24 2306 4' 10 (1.473 m)     Head Circumference --      Peak Flow --      Pain Score 04/03/24 2306 4     Pain Loc --      Pain Education --      Exclude from Growth Chart --     Most recent vital signs: Vitals:   04/03/24 2308  BP: 115/84  Pulse: 74  Resp: 20  Temp: 98.8 F (37.1 C)  SpO2: 99%     General: Awake, no distress.  CV:  Good peripheral perfusion.  Resp:  Normal effort.  Clear, no wheezing, no tachypnea or respiratory distress. Abd:  No distention.  Other:  Nontoxic-appearing, no nuchal rigidity, she is maintaining her secretions, not drooling   ED Results / Procedures / Treatments   Labs (all labs ordered are listed, but only abnormal results are displayed) Labs Reviewed  RESP PANEL BY RT-PCR (RSV, FLU A&B, COVID)  [id number]  GROUP  A STREP BY PCR      RADIOLOGY On my independent interpretation, chest x-ray without focal consolidation   PROCEDURES:  Critical Care performed: No  Procedures   MEDICATIONS ORDERED IN ED: Medications  guaiFENesin  tablet 200 mg (has no administration in time range)  alum & mag hydroxide-simeth (MAALOX/MYLANTA) 200-200-20 MG/5ML suspension 30 mL (has no administration in time range)    And  lidocaine  (XYLOCAINE ) 2 % viscous mouth solution 15 mL (has no administration in time range)  dexamethasone  (DECADRON ) 1 MG/ML solution 10 mg (has no administration in time range)     IMPRESSION / MDM / ASSESSMENT AND PLAN / ED COURSE  I reviewed the triage vital signs and the nursing notes.                              Differential diagnosis includes, but is not limited to, viral illness, pneumonia, postviral cough.  No wheezing on exam or tachypnea to  suggest reactive airway disease.  X-ray, respiratory viral panel and strep swab was obtained out of triage.  Patient's presentation is most consistent with acute presentation with potential threat to life or bodily function.  Independent interpretation of labs and imaging below.  Patient states that she been taking the [employer], did offer a inhaler, guaifenesin , did discuss with her about taking warm honey water  with lemon.  Give her some Decadron  here, also trial GI cocktail, guaifenesin .  Considered but no indication for inpatient admission at this time, she safe for outpatient management.  Will discharge with strict return precautions and instructions follow-up with primary care this week for reassessment.    Clinical Course as of 04/04/24 0137  Wed Apr 04, 2024  0050 DG Chest 2 View 1. No acute process.  [TT]  0050 Independent review of labs, Durell is negative, respiratory viral panel is negative. [TT]    Clinical Course User Index [TT] Waymond, Lorelle Cummins, MD     FINAL CLINICAL IMPRESSION(S) / ED DIAGNOSES   Final diagnoses:   Acute cough  Sore throat     Rx / DC Orders   ED Discharge Orders          Ordered    albuterol  (VENTOLIN  HFA) 108 (90 Base) MCG/ACT inhaler  Every 6 hours PRN        04/04/24 0102    guaiFENesin  200 MG tablet  Every 8 hours PRN        04/04/24 0102             Note:  This document was prepared using Dragon voice recognition software and may include unintentional dictation errors.    Waymond Lorelle Cummins, MD 04/04/24 CONDA    Waymond Lorelle Cummins, MD 04/04/24 [phone]

## 2024-04-04 NOTE — Discharge Instructions (Signed)
 You can try the guaifenesin  as well as ambulatory inhaler as needed for your coughing.  I also recommend trying honey lemon water .  Please make sure to follow your primary care doctor this week to get reassessed.

## 2024-07-13 ENCOUNTER — Encounter: Admitting: Plastic Surgery
